# Patient Record
Sex: Female | Born: 1980 | Race: White | Hispanic: No | Marital: Married | Smoking: Never smoker
Health system: Northeastern US, Academic
[De-identification: ages and names within clinical notes are randomized; demographics above are authoritative.]

## PROBLEM LIST (undated history)

## (undated) ENCOUNTER — Encounter

## (undated) DIAGNOSIS — E282 Polycystic ovarian syndrome: Secondary | ICD-10-CM

## (undated) HISTORY — PX: HYSTEROSCOPY: SHX211

## (undated) HISTORY — PX: WISDOM TOOTH EXTRACTION: SHX21

## (undated) MED FILL — ZEPBOUND 2.5 MG/0.5 ML SUBCUTANEOUS PEN INJECTOR: 2.5 2.5 mg/0.5 mL | SUBCUTANEOUS | 28 days supply | Qty: 2 | Fill #0 | Status: CN

---

## 2011-11-07 ENCOUNTER — Other Ambulatory Visit (HOSPITAL_COMMUNITY): Payer: Self-pay | Admitting: Gynecology

## 2011-11-07 DIAGNOSIS — N979 Female infertility, unspecified: Secondary | ICD-10-CM

## 2011-11-11 ENCOUNTER — Ambulatory Visit (HOSPITAL_COMMUNITY)
Admission: RE | Admit: 2011-11-11 | Discharge: 2011-11-11 | Disposition: A | Discharge status: Home / Self Care | Payer: BC Managed Care – PPO | Source: Ambulatory Visit | Attending: Gynecology | Admitting: Gynecology

## 2011-11-11 DIAGNOSIS — N979 Female infertility, unspecified: Secondary | ICD-10-CM

## 2011-11-11 MED ORDER — IOHEXOL 300 MG/ML  SOLN: 10.0000 mL | Freq: Once | INTRAMUSCULAR | Status: AC | PRN

## 2012-01-07 ENCOUNTER — Encounter (HOSPITAL_COMMUNITY): Payer: Self-pay | Admitting: Pharmacist

## 2012-01-07 ENCOUNTER — Encounter (HOSPITAL_COMMUNITY): Payer: Self-pay | Admitting: *Deleted

## 2012-01-07 NOTE — H&P (Signed)
Alexa Leonard is an 31 y.o. female G1P0 with MAB. CRL C/W 8 5/7 weeks with no FHT on prolonged U/S exam.    Pertinent Gynecological History: Menses: N/A Bleeding: pregnany Contraception: none DES exposure: unknown Blood transfusions: none Sexually transmitted diseases: no past history Previous GYN Procedures: none  Last mammogram: N/A Date: N/A Last pap: normal Date: 2012 OB History: G1, P0   Menstrual History: Menarche age: unknown No LMP recorded.    Past Medical History  Diagnosis Date  . PCOS (polycystic ovarian syndrome)     Past Surgical History  Procedure Date  . Wisdom tooth extraction     History reviewed. No pertinent family history.  Social History:  reports that she has quit smoking. She does not have any smokeless tobacco history on file. She reports that she does not drink alcohol or use illicit drugs.  Allergies: Allergies not on file  No prescriptions prior to admission    Review of Systems  Constitutional: Negative for fever.    There were no vitals taken for this visit. Physical Exam  Cardiovascular: Normal rate and regular rhythm.   Respiratory: Effort normal and breath sounds normal.  GI: Soft. There is no tenderness.  A+  No results found for this or any previous visit (from the past 24 hour(s)).  No results found.  Assessment/Plan: 31 yo G1P0 with MAB, CRL 8 5/7 weeks A+ blood type For D&E Alexa Leonard II,Alexa Leonard E 01/07/2012, 1:18 PM

## 2012-01-08 ENCOUNTER — Encounter (HOSPITAL_COMMUNITY): Payer: Self-pay | Admitting: Anesthesiology

## 2012-01-08 ENCOUNTER — Ambulatory Visit (HOSPITAL_COMMUNITY)
Admission: RE | Admit: 2012-01-08 | Discharge: 2012-01-08 | Disposition: A | Discharge status: Home / Self Care | Payer: BC Managed Care – PPO | Source: Ambulatory Visit | Attending: Obstetrics and Gynecology | Admitting: Obstetrics and Gynecology

## 2012-01-08 ENCOUNTER — Ambulatory Visit (HOSPITAL_COMMUNITY): Payer: BC Managed Care – PPO | Admitting: Anesthesiology

## 2012-01-08 ENCOUNTER — Encounter (HOSPITAL_COMMUNITY): Payer: Self-pay | Admitting: *Deleted

## 2012-01-08 ENCOUNTER — Encounter (HOSPITAL_COMMUNITY)
Admission: RE | Disposition: A | Discharge status: Home / Self Care | Payer: Self-pay | Source: Ambulatory Visit | Attending: Obstetrics and Gynecology

## 2012-01-08 DIAGNOSIS — O021 Missed abortion: Secondary | ICD-10-CM | POA: Insufficient documentation

## 2012-01-08 HISTORY — DX: Polycystic ovarian syndrome: E28.2

## 2012-01-08 HISTORY — PX: DILATION AND EVACUATION: SHX1459

## 2012-01-08 LAB — CBC
HCT: 41 % (ref 36.0–46.0)
Hemoglobin: 13.9 g/dL (ref 12.0–15.0)
MCV: 89.3 fL (ref 78.0–100.0)
RBC: 4.59 MIL/uL (ref 3.87–5.11)
RDW: 12.9 % (ref 11.5–15.5)
WBC: 8 10*3/uL (ref 4.0–10.5)

## 2012-01-08 SURGERY — DILATION AND EVACUATION, UTERUS
Anesthesia: Monitor Anesthesia Care | Site: Vagina | Wound class: Clean Contaminated

## 2012-01-08 MED ORDER — LIDOCAINE HCL 1 % IJ SOLN
INTRAMUSCULAR | Status: DC | PRN
  Administered 2012-01-08: 20 mL

## 2012-01-08 MED ORDER — METOCLOPRAMIDE HCL 5 MG/ML IJ SOLN: 10.0000 mg | Freq: Once | INTRAMUSCULAR | Status: DC | PRN

## 2012-01-08 MED ORDER — ONDANSETRON HCL 4 MG/2ML IJ SOLN
INTRAMUSCULAR | Status: AC
  Filled 2012-01-08: qty 2

## 2012-01-08 MED ORDER — HYDROCODONE-ACETAMINOPHEN 5-325 MG PO TABS
1.0000 | ORAL_TABLET | Freq: Once | ORAL | Status: AC
  Administered 2012-01-08: 1 via ORAL

## 2012-01-08 MED ORDER — FENTANYL CITRATE 0.05 MG/ML IJ SOLN
INTRAMUSCULAR | Status: AC
  Filled 2012-01-08: qty 2

## 2012-01-08 MED ORDER — PROPOFOL 10 MG/ML IV EMUL
INTRAVENOUS | Status: DC | PRN
  Administered 2012-01-08: 80 mg via INTRAVENOUS
  Administered 2012-01-08 (×2): 40 mg via INTRAVENOUS

## 2012-01-08 MED ORDER — CEFAZOLIN SODIUM 1-5 GM-% IV SOLN
INTRAVENOUS | Status: AC
  Filled 2012-01-08: qty 50

## 2012-01-08 MED ORDER — LIDOCAINE HCL (CARDIAC) 20 MG/ML IV SOLN
INTRAVENOUS | Status: DC | PRN
  Administered 2012-01-08: 80 mg via INTRAVENOUS

## 2012-01-08 MED ORDER — KETOROLAC TROMETHAMINE 30 MG/ML IJ SOLN
INTRAMUSCULAR | Status: DC | PRN
  Administered 2012-01-08: 30 mg via INTRAVENOUS

## 2012-01-08 MED ORDER — FENTANYL CITRATE 0.05 MG/ML IJ SOLN
INTRAMUSCULAR | Status: DC | PRN
  Administered 2012-01-08: 100 ug via INTRAVENOUS

## 2012-01-08 MED ORDER — FENTANYL CITRATE 0.05 MG/ML IJ SOLN: 25.0000 ug | INTRAMUSCULAR | Status: DC | PRN

## 2012-01-08 MED ORDER — LIDOCAINE HCL (CARDIAC) 20 MG/ML IV SOLN
INTRAVENOUS | Status: AC
  Filled 2012-01-08: qty 5

## 2012-01-08 MED ORDER — PROPOFOL 10 MG/ML IV EMUL
INTRAVENOUS | Status: AC
  Filled 2012-01-08: qty 20

## 2012-01-08 MED ORDER — MEPERIDINE HCL 25 MG/ML IJ SOLN: 6.2500 mg | INTRAMUSCULAR | Status: DC | PRN

## 2012-01-08 MED ORDER — MIDAZOLAM HCL 2 MG/2ML IJ SOLN
INTRAMUSCULAR | Status: AC
  Filled 2012-01-08: qty 4

## 2012-01-08 MED ORDER — OXYTOCIN 10 UNIT/ML IJ SOLN
INTRAMUSCULAR | Status: DC | PRN
  Administered 2012-01-08: 20 [IU] via INTRAMUSCULAR

## 2012-01-08 MED ORDER — ONDANSETRON HCL 4 MG/2ML IJ SOLN
INTRAMUSCULAR | Status: DC | PRN
  Administered 2012-01-08: 4 mg via INTRAVENOUS

## 2012-01-08 MED ORDER — MIDAZOLAM HCL 5 MG/5ML IJ SOLN
INTRAMUSCULAR | Status: DC | PRN
  Administered 2012-01-08: 2 mg via INTRAVENOUS

## 2012-01-08 MED ORDER — METHYLERGONOVINE MALEATE 0.2 MG PO TABS: 0.2000 mg | ORAL_TABLET | Freq: Three times a day (TID) | ORAL | Status: AC

## 2012-01-08 MED ORDER — HYDROCODONE-ACETAMINOPHEN 5-500 MG PO TABS: 1.0000 | ORAL_TABLET | Freq: Four times a day (QID) | ORAL | Status: AC | PRN

## 2012-01-08 MED ORDER — CEFAZOLIN SODIUM 1-5 GM-% IV SOLN
1.0000 g | INTRAVENOUS | Status: AC
  Administered 2012-01-08: 1 g via INTRAVENOUS

## 2012-01-08 MED ORDER — HYDROCODONE-ACETAMINOPHEN 5-325 MG PO TABS
ORAL_TABLET | ORAL | Status: AC
  Filled 2012-01-08: qty 1

## 2012-01-08 MED ORDER — LACTATED RINGERS IV SOLN
INTRAVENOUS | Status: DC
  Administered 2012-01-08 (×2): via INTRAVENOUS

## 2012-01-08 SURGICAL SUPPLY — 17 items
CATH ROBINSON RED A/P 16FR (CATHETERS) ×2 IMPLANT
CLOTH BEACON ORANGE TIMEOUT ST (SAFETY) ×2 IMPLANT
DECANTER SPIKE VIAL GLASS SM (MISCELLANEOUS) ×2 IMPLANT
GLOVE BIO SURGEON STRL SZ8 (GLOVE) ×4 IMPLANT
GOWN PREVENTION PLUS LG XLONG (DISPOSABLE) ×2 IMPLANT
KIT BERKELEY 1ST TRIMESTER 3/8 (MISCELLANEOUS) ×2 IMPLANT
NEEDLE SPNL 22GX3.5 QUINCKE BK (NEEDLE) ×2 IMPLANT
NS IRRIG 1000ML POUR BTL (IV SOLUTION) ×2 IMPLANT
PACK VAGINAL MINOR WOMEN LF (CUSTOM PROCEDURE TRAY) ×2 IMPLANT
PAD PREP 24X48 CUFFED NSTRL (MISCELLANEOUS) ×2 IMPLANT
SET BERKELEY SUCTION TUBING (SUCTIONS) ×2 IMPLANT
SYR CONTROL 10ML LL (SYRINGE) ×2 IMPLANT
TOWEL OR 17X24 6PK STRL BLUE (TOWEL DISPOSABLE) ×4 IMPLANT
VACURETTE 10 RIGID CVD (CANNULA) IMPLANT
VACURETTE 7MM CVD STRL WRAP (CANNULA) IMPLANT
VACURETTE 8 RIGID CVD (CANNULA) ×2 IMPLANT
VACURETTE 9 RIGID CVD (CANNULA) ×2 IMPLANT

## 2012-01-08 NOTE — Discharge Instructions (Signed)
No vaginal entryDISCHARGE INSTRUCTIONS: D&C / D&E The following instructions have been prepared to help you care for yourself upon your return home.   Personal hygiene: . Use sanitary pads for vaginal drainage, not tampons. . Shower the day after your procedure. . NO tub baths, pools or Jacuzzis for 2-3 weeks. . Wipe front to back after using the bathroom.  Activity and limitations: . Do NOT drive or operate any equipment for 24 hours. The effects of anesthesia are still present and drowsiness may result. . Do NOT rest in bed all day. . Walking is encouraged. . Walk up and down stairs slowly. . You may resume your normal activity in one to two days or as indicated by your physician.  Sexual activity: NO intercourse for at least 2 weeks after the procedure, or as indicated by your physician.  Diet: Eat a light meal as desired this evening. You may resume your usual diet tomorrow.  Return to work: You may resume your work activities in one to two days or as indicated by your doctor.  What to expect after your surgery: Expect to have vaginal bleeding/discharge for 2-3 days and spotting for up to 10 days. It is not unusual to have soreness for up to 1-2 weeks. You may have a slight burning sensation when you urinate for the first day. Mild cramps may continue for a couple of days. You may have a regular period in 2-6 weeks.  Call your doctor for any of the following: . Excessive vaginal bleeding, saturating and changing one pad every hour. . Inability to urinate 6 hours after discharge from hospital. . Pain not relieved by pain medication. . Fever of 100.4 F or greater. . Unusual vaginal discharge or odor.  Return to office ________________ Call for an appointment ___________________  Patient's signature: ______________________  Nurse's signature ________________________  Post Anesthesia Care Unit [phone]   

## 2012-01-08 NOTE — Op Note (Signed)
NAMEJAZ, LANINGHAM NO.:  [id number]  MEDICAL RECORD NO.:  [id number]  LOCATION:  [facility]                          FACILITY:  [facility]  PHYSICIAN:  Guy Sandifer. Henderson Cloud, M.D. DATE OF BIRTH:  04-26-1981  DATE OF PROCEDURE:  01/08/2012 DATE OF DISCHARGE:  01/08/2012                              OPERATIVE REPORT   PREOPERATIVE DIAGNOSIS:  Missed abortion.  POSTOPERATIVE DIAGNOSIS:  Missed abortion.  PROCEDURE:  Dilatation and evacuation.  SURGEON:  Guy Sandifer. Henderson Cloud, MD  ANESTHESIA:  MAC.  SPECIMENS:  Products of conception to Pathology.  ESTIMATED BLOOD LOSS:  Less than 100 mL.  INDICATIONS AND CONSENT:  This patient is a 31 year old married white female, G1, P0 at 9-1/2 weeks by dates and 8 weeks 5 days by crown-rump length.  There is no fetal heart rate on prolonged ultrasound examination.  After discussion of options, she was brought to the operating room for dilatation and evacuation.  Potential risks and complications have been discussed preoperatively including but not limited to, infection, organ damage, uterine perforation, bleeding requiring transfusion of blood products with HIV and hepatitis acquisition, laparotomy, laparoscopy, DVT, PE, pneumonia, transfusion of blood products with HIV and hepatitis acquisition, intrauterine synechia, and secondary infertility.  All questions were answered and consent was signed on the chart.  PROCEDURE:  The patient was taken to the operating room, where she was identified, placed in dorsal supine position and she was sedated.  She was then placed in the dorsal lithotomy position.  Time-out undertaken. She was prepped, bladder straight catheterized and draped in sterile fashion.  The uterus was 9 weeks in size, mid plane.  Bivalve speculum was placed in the vagina and the anterior cervical lip was injected with 1% plain Xylocaine and grasped with single-tooth tenaculum. Paracervical block was placed at 2, 4, 5, 7, 8,  and 10 o'clock positions with approximately 20 mL of the same solution.  Cervix was gently progressively dilated.  A #8 curved curette was then placed and suction curettage was carried out for obvious products of conception.  Pitocin 20 units were added to the remaining 700 mL of IV fluids after the initial pass of the suction curette.  Alternating sharp and suction curettage was carried out and the cavity was clean.  Good hemostasis was obtained. All counts were correct.  The patient was awakened, taken to recovery room in stable condition.  The patient does receive preoperative antibiotics.  Blood type is Rh positive.     Guy Sandifer Henderson Cloud, M.D.     JET/MEDQ  D:  01/08/2012  T:  01/08/2012  Job:  [id number]

## 2012-01-08 NOTE — Anesthesia Preprocedure Evaluation (Signed)
Anesthesia Evaluation  Patient identified by MRN, date of birth, ID band Patient awake    Reviewed: Allergy & Precautions, H&P , NPO status , Patient's Chart, lab work & pertinent test results  Airway Mallampati: I TM Distance: >3 FB Neck ROM: full    Dental No notable dental hx.    Pulmonary neg pulmonary ROS,    Pulmonary exam normal       Cardiovascular negative cardio ROS      Neuro/Psych negative neurological ROS  negative psych ROS   GI/Hepatic negative GI ROS, Neg liver ROS,   Endo/Other  negative endocrine ROS  Renal/GU negative Renal ROS  negative genitourinary   Musculoskeletal   Abdominal Normal abdominal exam  (+)   Peds negative pediatric ROS (+)  Hematology negative hematology ROS (+)   Anesthesia Other Findings   Reproductive/Obstetrics                           Anesthesia Physical Anesthesia Plan  ASA: II  Anesthesia Plan: MAC   Post-op Pain Management:    Induction: Intravenous  Airway Management Planned:   Additional Equipment:   Intra-op Plan:   Post-operative Plan:   Informed Consent: I have reviewed the patients History and Physical, chart, labs and discussed the procedure including the risks, benefits and alternatives for the proposed anesthesia with the patient or authorized representative who has indicated his/her understanding and acceptance.     Plan Discussed with: CRNA and Surgeon  Anesthesia Plan Comments:         Anesthesia Quick Evaluation

## 2012-01-08 NOTE — Addendum Note (Signed)
Addendum  created 01/08/12 1459 by Tyrone Apple. Malen Gauze, MD   Modules edited:Orders, PRL Based Order Sets

## 2012-01-08 NOTE — Anesthesia Postprocedure Evaluation (Signed)
Anesthesia Post Note  Patient: Alexa Leonard  Procedure(s) Performed: Procedure(s) (LRB): DILATATION AND EVACUATION (N/A)  Anesthesia type: MAC  Patient location: PACU  Post pain: Pain level controlled  Post assessment: Post-op Vital signs reviewed  Last Vitals:  Filed Vitals:   01/08/12 1123  BP: 114/77  Pulse: 76  Temp: 36.7 C  Resp: 18    Post vital signs: Reviewed  Level of consciousness: sedated  Complications: No apparent anesthesia complications

## 2012-01-08 NOTE — Brief Op Note (Signed)
01/08/2012  1:47 PM  PATIENT:  Alexa Leonard  31 y.o. female  PRE-OPERATIVE DIAGNOSIS:  missed ab  POST-OPERATIVE DIAGNOSIS:  missed ab  PROCEDURE:  Procedure(s) (LRB): DILATATION AND EVACUATION (N/A)  SURGEON:  Surgeon(s) and Role:    * Leslie Andrea, MD - Primary  PHYSICIAN ASSISTANT:   ASSISTANTS: none   ANESTHESIA:   MAC  EBL:  Total I/O In: 1500 [I.V.:1500] Out: -   BLOOD ADMINISTERED:none  DRAINS: none   LOCAL MEDICATIONS USED:  LIDOCAINE   SPECIMEN:  Source of Specimen:  POC  DISPOSITION OF SPECIMEN:  PATHOLOGY  COUNTS:  YES  TOURNIQUET:  * No tourniquets in log *  DICTATION: .Other Dictation: Dictation Number [id number]  PLAN OF CARE: Discharge to home after PACU  PATIENT DISPOSITION:  PACU - hemodynamically stable.   Delay start of Pharmacological VTE agent (>24hrs) due to surgical blood loss or risk of bleeding: not applicable

## 2012-01-08 NOTE — Progress Notes (Signed)
Reviewed with patient/husband D&E. Reviewed complications-infection, bleeding-HIV/Hep, organ damage, DVT/PE, IU synechiae and infertility. Methergine 0.2 tid x 6 doses All questions answered

## 2012-01-08 NOTE — Transfer of Care (Signed)
Immediate Anesthesia Transfer of Care Note  Patient: Alexa Leonard  Procedure(s) Performed: Procedure(s) (LRB): DILATATION AND EVACUATION (N/A)  Patient Location: PACU  Anesthesia Type: MAC  Level of Consciousness: awake, alert  and oriented  Airway & Oxygen Therapy: Patient Spontanous Breathing  Post-op Assessment: Report given to PACU RN and Post -op Vital signs reviewed and stable  Post vital signs: Reviewed and stable  Complications: No apparent anesthesia complications

## 2012-01-12 ENCOUNTER — Encounter (HOSPITAL_COMMUNITY): Payer: Self-pay | Admitting: Obstetrics and Gynecology

## 2012-06-03 ENCOUNTER — Other Ambulatory Visit (HOSPITAL_COMMUNITY): Payer: Self-pay | Admitting: Gynecology

## 2012-06-03 DIAGNOSIS — Z3141 Encounter for fertility testing: Secondary | ICD-10-CM

## 2012-06-08 ENCOUNTER — Ambulatory Visit (HOSPITAL_COMMUNITY)
Admission: RE | Admit: 2012-06-08 | Discharge: 2012-06-08 | Disposition: A | Discharge status: Home / Self Care | Payer: BC Managed Care – PPO | Source: Ambulatory Visit | Attending: Gynecology | Admitting: Gynecology

## 2012-06-08 DIAGNOSIS — Z3141 Encounter for fertility testing: Secondary | ICD-10-CM

## 2012-06-08 DIAGNOSIS — N979 Female infertility, unspecified: Secondary | ICD-10-CM | POA: Insufficient documentation

## 2012-06-08 MED ORDER — IOHEXOL 300 MG/ML  SOLN: 26.0000 mL | Freq: Once | INTRAMUSCULAR | Status: AC | PRN

## 2012-12-25 LAB — OB RESULTS CONSOLE HEPATITIS B SURFACE ANTIGEN: Hepatitis B Surface Ag: NEGATIVE

## 2012-12-25 LAB — OB RESULTS CONSOLE ABO/RH: RH Type: POSITIVE

## 2012-12-25 LAB — OB RESULTS CONSOLE ANTIBODY SCREEN: Antibody Screen: NEGATIVE

## 2012-12-25 LAB — OB RESULTS CONSOLE RUBELLA ANTIBODY, IGM: Rubella: IMMUNE

## 2012-12-25 LAB — OB RESULTS CONSOLE GC/CHLAMYDIA: Gonorrhea: NEGATIVE

## 2013-03-29 ENCOUNTER — Encounter (HOSPITAL_COMMUNITY): Payer: Self-pay | Admitting: Obstetrics and Gynecology

## 2013-04-01 ENCOUNTER — Other Ambulatory Visit (HOSPITAL_COMMUNITY): Payer: Self-pay | Admitting: Obstetrics and Gynecology

## 2013-04-01 DIAGNOSIS — R772 Abnormality of alphafetoprotein: Secondary | ICD-10-CM

## 2013-04-05 ENCOUNTER — Ambulatory Visit (HOSPITAL_COMMUNITY)
Admission: RE | Admit: 2013-04-05 | Discharge: 2013-04-05 | Disposition: A | Discharge status: Home / Self Care | Payer: BC Managed Care – PPO | Source: Ambulatory Visit | Attending: Obstetrics and Gynecology | Admitting: Obstetrics and Gynecology

## 2013-04-05 ENCOUNTER — Encounter (HOSPITAL_COMMUNITY): Payer: Self-pay

## 2013-04-05 VITALS — BP 123/78 | HR 110 | Wt 150.5 lb

## 2013-04-05 DIAGNOSIS — O358XX0 Maternal care for other (suspected) fetal abnormality and damage, not applicable or unspecified: Secondary | ICD-10-CM | POA: Insufficient documentation

## 2013-04-05 DIAGNOSIS — R772 Abnormality of alphafetoprotein: Secondary | ICD-10-CM

## 2013-04-05 DIAGNOSIS — O3500X0 Maternal care for (suspected) central nervous system malformation or damage in fetus, unspecified, not applicable or unspecified: Secondary | ICD-10-CM | POA: Insufficient documentation

## 2013-04-05 DIAGNOSIS — Z1389 Encounter for screening for other disorder: Secondary | ICD-10-CM | POA: Insufficient documentation

## 2013-04-05 DIAGNOSIS — Z363 Encounter for antenatal screening for malformations: Secondary | ICD-10-CM | POA: Insufficient documentation

## 2013-04-05 DIAGNOSIS — O350XX0 Maternal care for (suspected) central nervous system malformation in fetus, not applicable or unspecified: Secondary | ICD-10-CM | POA: Insufficient documentation

## 2013-04-05 NOTE — Progress Notes (Signed)
Alexa Leonard  was seen today for an ultrasound appointment.  See full report in [facility].  Comments: Ms. Cowdrey was seen today due to elevated MSAFP (2.71 MoM).  The fetal anatomic survey was within normal limits; specifically, the spine, cerebellum, posterior fossa and cord insertion were within normal limits.  We briefly reviewed the differential diagnosis of elevated MSAFP in pregnancy.  Despite normal ultrasound findings, she is at increased risk for fetal growth restriction and hypertensive disorders of pregnancy and continued fetal growth surveillance is indicated.  Impression: Single IUP at 19 1/7 weeks Normal anatmoic fetal survey.  Somewhat limited views of the fetal heart were obtained. No markers associated with aneuploidy were appreciated. Normal amniotic fluid volume.  Recommendations: Recommend follow-up ultrasound examination in 6 weeks for interval growth and to reevaluate the fetal heart.  Alpha Gula, MD

## 2013-04-13 ENCOUNTER — Other Ambulatory Visit: Payer: Self-pay

## 2013-05-17 ENCOUNTER — Encounter (HOSPITAL_COMMUNITY): Payer: Self-pay

## 2013-05-17 ENCOUNTER — Ambulatory Visit (HOSPITAL_COMMUNITY)
Admission: RE | Admit: 2013-05-17 | Discharge: 2013-05-17 | Disposition: A | Discharge status: Home / Self Care | Payer: BC Managed Care – PPO | Source: Ambulatory Visit | Attending: Obstetrics and Gynecology | Admitting: Obstetrics and Gynecology

## 2013-05-17 VITALS — BP 124/78 | HR 115 | Wt 160.0 lb

## 2013-05-17 DIAGNOSIS — O3500X0 Maternal care for (suspected) central nervous system malformation or damage in fetus, unspecified, not applicable or unspecified: Secondary | ICD-10-CM | POA: Insufficient documentation

## 2013-05-17 DIAGNOSIS — O350XX0 Maternal care for (suspected) central nervous system malformation in fetus, not applicable or unspecified: Secondary | ICD-10-CM | POA: Insufficient documentation

## 2013-05-17 DIAGNOSIS — R772 Abnormality of alphafetoprotein: Secondary | ICD-10-CM

## 2013-05-17 DIAGNOSIS — Z3689 Encounter for other specified antenatal screening: Secondary | ICD-10-CM | POA: Insufficient documentation

## 2013-05-17 NOTE — Progress Notes (Signed)
Alexa Leonard  was seen today for an ultrasound appointment.  See full report in [facility].  Impression: Single IUP at 25 5/7 weeks Elevated MSAFP (2.71 MoM) There appears to be an isolated, dilated loop of bowel measuring 1.2 cm in diameter in the right pelvis the remainder of the fetal anatomy appears normal Interval growth is appropriate (45th %tile) Normal amniotic fluid volume  Recommendations: Recommend follow-up ultrasound examination ibn 4 weeks for interval growth and to reevaluate anatomy.  Alexa Gula, MD

## 2013-06-14 ENCOUNTER — Ambulatory Visit (HOSPITAL_COMMUNITY)
Admission: RE | Admit: 2013-06-14 | Discharge: 2013-06-14 | Disposition: A | Discharge status: Home / Self Care | Payer: BC Managed Care – PPO | Source: Ambulatory Visit | Attending: Obstetrics and Gynecology | Admitting: Obstetrics and Gynecology

## 2013-06-14 DIAGNOSIS — O350XX0 Maternal care for (suspected) central nervous system malformation in fetus, not applicable or unspecified: Secondary | ICD-10-CM | POA: Insufficient documentation

## 2013-06-14 DIAGNOSIS — R772 Abnormality of alphafetoprotein: Secondary | ICD-10-CM

## 2013-06-14 DIAGNOSIS — O3500X0 Maternal care for (suspected) central nervous system malformation or damage in fetus, unspecified, not applicable or unspecified: Secondary | ICD-10-CM | POA: Insufficient documentation

## 2013-07-11 ENCOUNTER — Other Ambulatory Visit (HOSPITAL_COMMUNITY): Payer: Self-pay | Admitting: Obstetrics and Gynecology

## 2013-07-11 DIAGNOSIS — O289 Unspecified abnormal findings on antenatal screening of mother: Secondary | ICD-10-CM

## 2013-07-12 ENCOUNTER — Ambulatory Visit (HOSPITAL_COMMUNITY)
Admission: RE | Admit: 2013-07-12 | Discharge: 2013-07-12 | Disposition: A | Discharge status: Home / Self Care | Payer: BC Managed Care – PPO | Source: Ambulatory Visit | Attending: Obstetrics and Gynecology | Admitting: Obstetrics and Gynecology

## 2013-07-12 ENCOUNTER — Encounter (HOSPITAL_COMMUNITY): Payer: Self-pay

## 2013-07-12 DIAGNOSIS — Z3689 Encounter for other specified antenatal screening: Secondary | ICD-10-CM | POA: Insufficient documentation

## 2013-07-12 DIAGNOSIS — O3500X0 Maternal care for (suspected) central nervous system malformation or damage in fetus, unspecified, not applicable or unspecified: Secondary | ICD-10-CM | POA: Insufficient documentation

## 2013-07-12 DIAGNOSIS — O289 Unspecified abnormal findings on antenatal screening of mother: Secondary | ICD-10-CM

## 2013-07-12 DIAGNOSIS — O350XX0 Maternal care for (suspected) central nervous system malformation in fetus, not applicable or unspecified: Secondary | ICD-10-CM | POA: Insufficient documentation

## 2013-07-21 LAB — OB RESULTS CONSOLE GBS: GBS: NEGATIVE

## 2013-08-23 ENCOUNTER — Encounter (HOSPITAL_COMMUNITY): Payer: Self-pay | Admitting: *Deleted

## 2013-08-23 ENCOUNTER — Telehealth (HOSPITAL_COMMUNITY): Payer: Self-pay | Admitting: *Deleted

## 2013-08-23 NOTE — Telephone Encounter (Signed)
Preadmission screen  

## 2013-08-26 ENCOUNTER — Inpatient Hospital Stay (HOSPITAL_COMMUNITY)
Admission: AD | Admit: 2013-08-26 | Discharge: 2013-08-26 | Disposition: A | Discharge status: Home / Self Care | Payer: BC Managed Care – PPO | Source: Ambulatory Visit | Attending: Obstetrics and Gynecology | Admitting: Obstetrics and Gynecology

## 2013-08-26 ENCOUNTER — Encounter (HOSPITAL_COMMUNITY): Payer: Self-pay | Admitting: *Deleted

## 2013-08-26 DIAGNOSIS — O99891 Other specified diseases and conditions complicating pregnancy: Secondary | ICD-10-CM | POA: Insufficient documentation

## 2013-08-26 NOTE — MAU Note (Signed)
Pt presents with complaints of a gush of fluid last night and noticed some more leakage this morning. Denies any vaginal bleeding, states baby is active.

## 2013-08-28 ENCOUNTER — Inpatient Hospital Stay (HOSPITAL_COMMUNITY)
Admission: RE | Admit: 2013-08-28 | Discharge: 2013-08-31 | DRG: 775 | Disposition: A | Discharge status: Home / Self Care | Payer: BC Managed Care – PPO | Source: Ambulatory Visit | Attending: Obstetrics and Gynecology | Admitting: Obstetrics and Gynecology

## 2013-08-28 DIAGNOSIS — Z87891 Personal history of nicotine dependence: Secondary | ICD-10-CM

## 2013-08-28 DIAGNOSIS — IMO0001 Reserved for inherently not codable concepts without codable children: Secondary | ICD-10-CM

## 2013-08-28 LAB — CBC
MCH: 30.4 pg (ref 26.0–34.0)
MCHC: 34.2 g/dL (ref 30.0–36.0)
MCV: 88.8 fL (ref 78.0–100.0)
Platelets: 205 10*3/uL (ref 150–400)
RBC: 4.54 MIL/uL (ref 3.87–5.11)
RDW: 14.2 % (ref 11.5–15.5)

## 2013-08-28 MED ORDER — BUTORPHANOL TARTRATE 1 MG/ML IJ SOLN
1.0000 mg | INTRAMUSCULAR | Status: DC | PRN
Start: 2013-08-28 — End: 2013-08-29
  Administered 2013-08-29 (×2): 1 mg via INTRAVENOUS
  Filled 2013-08-28 (×2): qty 1

## 2013-08-28 MED ORDER — LIDOCAINE HCL (PF) 1 % IJ SOLN
30.0000 mL | INTRAMUSCULAR | Status: DC | PRN
  Filled 2013-08-28: qty 30

## 2013-08-28 MED ORDER — LACTATED RINGERS IV SOLN: 500.0000 mL | INTRAVENOUS | Status: DC | PRN

## 2013-08-28 MED ORDER — OXYTOCIN BOLUS FROM INFUSION: 500.0000 mL | INTRAVENOUS | Status: DC

## 2013-08-28 MED ORDER — OXYTOCIN 40 UNITS IN LACTATED RINGERS INFUSION - SIMPLE MED
62.5000 mL/h | INTRAVENOUS | Status: DC
  Filled 2013-08-28: qty 1000

## 2013-08-28 MED ORDER — MISOPROSTOL 25 MCG QUARTER TABLET
25.0000 ug | ORAL_TABLET | ORAL | Status: AC | PRN
  Administered 2013-08-28 – 2013-08-29 (×2): 25 ug via VAGINAL
  Filled 2013-08-28 (×2): qty 0.25

## 2013-08-28 MED ORDER — ZOLPIDEM TARTRATE 5 MG PO TABS: 5.0000 mg | ORAL_TABLET | Freq: Every evening | ORAL | Status: DC | PRN

## 2013-08-28 MED ORDER — LACTATED RINGERS IV SOLN
INTRAVENOUS | Status: DC
  Administered 2013-08-28: 1000 mL via INTRAVENOUS
  Administered 2013-08-29: 03:00:00 via INTRAVENOUS

## 2013-08-28 MED ORDER — OXYCODONE-ACETAMINOPHEN 5-325 MG PO TABS: 1.0000 | ORAL_TABLET | ORAL | Status: DC | PRN

## 2013-08-28 MED ORDER — IBUPROFEN 600 MG PO TABS: 600.0000 mg | ORAL_TABLET | Freq: Four times a day (QID) | ORAL | Status: DC | PRN

## 2013-08-28 MED ORDER — ONDANSETRON HCL 4 MG/2ML IJ SOLN: 4.0000 mg | Freq: Four times a day (QID) | INTRAMUSCULAR | Status: DC | PRN

## 2013-08-28 MED ORDER — CITRIC ACID-SODIUM CITRATE 334-500 MG/5ML PO SOLN: 30.0000 mL | ORAL | Status: DC | PRN

## 2013-08-28 MED ORDER — PROMETHAZINE HCL 25 MG/ML IJ SOLN: 12.5000 mg | Freq: Four times a day (QID) | INTRAMUSCULAR | Status: DC | PRN

## 2013-08-28 MED ORDER — TERBUTALINE SULFATE 1 MG/ML IJ SOLN: 0.2500 mg | Freq: Once | INTRAMUSCULAR | Status: AC | PRN

## 2013-08-28 MED ORDER — ACETAMINOPHEN 325 MG PO TABS: 650.0000 mg | ORAL_TABLET | ORAL | Status: DC | PRN

## 2013-08-29 ENCOUNTER — Encounter (HOSPITAL_COMMUNITY): Payer: Self-pay

## 2013-08-29 ENCOUNTER — Inpatient Hospital Stay (HOSPITAL_COMMUNITY): Payer: BC Managed Care – PPO | Admitting: Anesthesiology

## 2013-08-29 ENCOUNTER — Encounter (HOSPITAL_COMMUNITY): Payer: BC Managed Care – PPO | Admitting: Anesthesiology

## 2013-08-29 LAB — RPR: RPR Ser Ql: NONREACTIVE

## 2013-08-29 MED ORDER — SENNOSIDES-DOCUSATE SODIUM 8.6-50 MG PO TABS
2.0000 | ORAL_TABLET | ORAL | Status: DC
  Administered 2013-08-29 – 2013-08-30 (×2): 2 via ORAL
  Filled 2013-08-29 (×2): qty 2

## 2013-08-29 MED ORDER — TETANUS-DIPHTH-ACELL PERTUSSIS 5-2.5-18.5 LF-MCG/0.5 IM SUSP: 0.5000 mL | Freq: Once | INTRAMUSCULAR | Status: DC

## 2013-08-29 MED ORDER — FLEET ENEMA 7-19 GM/118ML RE ENEM: 1.0000 | ENEMA | Freq: Every day | RECTAL | Status: DC | PRN

## 2013-08-29 MED ORDER — ZOLPIDEM TARTRATE 5 MG PO TABS: 5.0000 mg | ORAL_TABLET | Freq: Every evening | ORAL | Status: DC | PRN

## 2013-08-29 MED ORDER — OXYCODONE-ACETAMINOPHEN 5-325 MG PO TABS: 1.0000 | ORAL_TABLET | ORAL | Status: DC | PRN

## 2013-08-29 MED ORDER — LIDOCAINE HCL (PF) 1 % IJ SOLN
INTRAMUSCULAR | Status: DC | PRN
  Administered 2013-08-29: 12.5 mL

## 2013-08-29 MED ORDER — BISACODYL 10 MG RE SUPP: 10.0000 mg | Freq: Every day | RECTAL | Status: DC | PRN

## 2013-08-29 MED ORDER — FENTANYL 2.5 MCG/ML BUPIVACAINE 1/10 % EPIDURAL INFUSION (WH - ANES)
INTRAMUSCULAR | Status: DC | PRN
  Administered 2013-08-29: 12.5 mL/h via EPIDURAL

## 2013-08-29 MED ORDER — LANOLIN HYDROUS EX OINT: TOPICAL_OINTMENT | CUTANEOUS | Status: DC | PRN

## 2013-08-29 MED ORDER — FENTANYL 2.5 MCG/ML BUPIVACAINE 1/10 % EPIDURAL INFUSION (WH - ANES)
14.0000 mL/h | INTRAMUSCULAR | Status: DC | PRN
  Filled 2013-08-29: qty 125

## 2013-08-29 MED ORDER — EPHEDRINE 5 MG/ML INJ: 10.0000 mg | INTRAVENOUS | Status: DC | PRN

## 2013-08-29 MED ORDER — ONDANSETRON HCL 4 MG/2ML IJ SOLN: 4.0000 mg | INTRAMUSCULAR | Status: DC | PRN

## 2013-08-29 MED ORDER — PRENATAL MULTIVITAMIN CH
1.0000 | ORAL_TABLET | Freq: Every day | ORAL | Status: DC
  Administered 2013-08-30 – 2013-08-31 (×2): 1 via ORAL
  Filled 2013-08-29 (×2): qty 1

## 2013-08-29 MED ORDER — IBUPROFEN 600 MG PO TABS
600.0000 mg | ORAL_TABLET | Freq: Four times a day (QID) | ORAL | Status: DC
  Administered 2013-08-29 – 2013-08-31 (×6): 600 mg via ORAL
  Filled 2013-08-29 (×7): qty 1

## 2013-08-29 MED ORDER — PHENYLEPHRINE 40 MCG/ML (10ML) SYRINGE FOR IV PUSH (FOR BLOOD PRESSURE SUPPORT)
80.0000 ug | PREFILLED_SYRINGE | INTRAVENOUS | Status: DC | PRN
  Filled 2013-08-29: qty 10

## 2013-08-29 MED ORDER — EPHEDRINE 5 MG/ML INJ
10.0000 mg | INTRAVENOUS | Status: DC | PRN
  Filled 2013-08-29: qty 4

## 2013-08-29 MED ORDER — PHENYLEPHRINE 40 MCG/ML (10ML) SYRINGE FOR IV PUSH (FOR BLOOD PRESSURE SUPPORT): 80.0000 ug | PREFILLED_SYRINGE | INTRAVENOUS | Status: DC | PRN

## 2013-08-29 MED ORDER — LACTATED RINGERS IV SOLN: 500.0000 mL | Freq: Once | INTRAVENOUS | Status: DC

## 2013-08-29 MED ORDER — DIBUCAINE 1 % RE OINT: 1.0000 "application " | TOPICAL_OINTMENT | RECTAL | Status: DC | PRN

## 2013-08-29 MED ORDER — DIPHENHYDRAMINE HCL 50 MG/ML IJ SOLN: 12.5000 mg | INTRAMUSCULAR | Status: DC | PRN

## 2013-08-29 MED ORDER — ONDANSETRON HCL 4 MG PO TABS: 4.0000 mg | ORAL_TABLET | ORAL | Status: DC | PRN

## 2013-08-29 MED ORDER — BENZOCAINE-MENTHOL 20-0.5 % EX AERO
1.0000 "application " | INHALATION_SPRAY | CUTANEOUS | Status: DC | PRN
  Administered 2013-08-29: 1 via TOPICAL
  Filled 2013-08-29: qty 56

## 2013-08-29 MED ORDER — SIMETHICONE 80 MG PO CHEW: 80.0000 mg | CHEWABLE_TABLET | ORAL | Status: DC | PRN

## 2013-08-29 MED ORDER — WITCH HAZEL-GLYCERIN EX PADS: 1.0000 "application " | MEDICATED_PAD | CUTANEOUS | Status: DC | PRN

## 2013-08-29 MED ORDER — DIPHENHYDRAMINE HCL 25 MG PO CAPS: 25.0000 mg | ORAL_CAPSULE | Freq: Four times a day (QID) | ORAL | Status: DC | PRN

## 2013-08-29 NOTE — Progress Notes (Signed)
Cx rim on right/C/+1 AROM scant clear fluid FHT reactive, good response to scalp stim UCs q 2-3 min

## 2013-08-29 NOTE — H&P (Signed)
Alexa Leonard is a 32 y.o. female presenting for IOL last pm. Pregnancy complicated by elevated MSAFP with normal fetal surveillance.  Maternal Medical History:  Fetal activity: Perceived fetal activity is normal.      OB History   Grav Para Term Preterm Abortions TAB SAB Ect Mult Living   2 0 0 0 1 0 1 0 0 0      Past Medical History  Diagnosis Date  . PCOS (polycystic ovarian syndrome)    Past Surgical History  Procedure Laterality Date  . Wisdom tooth extraction    . Dilation and evacuation  01/08/2012    Procedure: DILATATION AND EVACUATION;  Surgeon: Leslie Andrea, MD;  Location: [facility];  Service: Gynecology;  Laterality: N/A;  . Hysteroscopy     Family History: family history includes Atrial fibrillation in her maternal grandmother and mother; Hypertension in her mother; Pulmonary embolism in her maternal grandfather. Social History:  reports that she quit smoking about 3 years ago. She has never used smokeless tobacco. She reports that she does not drink alcohol or use illicit drugs.   Prenatal Transfer Tool  Maternal Diabetes: No Genetic Screening: Normal Maternal Ultrasounds/Referrals: Normal Fetal Ultrasounds or other Referrals:  Referred to Materal Fetal Medicine  Maternal Substance Abuse:  No Significant Maternal Medications:  None Significant Maternal Lab Results:  Lab values include: Other: elevated MSAFP Other Comments:  None  Review of Systems  Eyes: Negative for blurred vision.  Gastrointestinal: Negative for abdominal pain.  Neurological: Negative for headaches.    Dilation: 2 Effacement (%): 70 Station: -2 Exam by:: AHarper, RN Blood pressure 125/81, pulse 101, temperature 98.3 F (36.8 C), temperature source Oral, resp. rate 18, height 5\' 2"  (1.575 m), weight 176 lb (79.833 kg), last menstrual period 11/18/2012, SpO2 99.00%. Maternal Exam:  Uterine Assessment: Contraction strength is firm.  Contraction frequency is regular.   Abdomen: Fetal  presentation: vertex     Fetal Exam Fetal State Assessment: Category I - tracings are normal.     Physical Exam  Cardiovascular: Normal rate and regular rhythm.   Respiratory: Effort normal and breath sounds normal.  GI: Soft. There is no tenderness.    Prenatal labs: ABO, Rh: A/Positive/-- (04/12 0000) Antibody: Negative (04/12 0000) Rubella: Immune (04/12 0000) RPR: NON REACTIVE (12/14 1950)  HBsAg: Negative (04/12 0000)  HIV: Non-reactive (04/12 0000)  GBS: Negative (11/06 0000)   Assessment/Plan: 32 yo G2P0 at 40 4/7 wks admitted for two stage induction D/W patient induction and reviewed options of pain management    Alfonza Toft II,Mazal Ebey E 08/29/2013, 8:07 AM

## 2013-08-29 NOTE — Progress Notes (Signed)
Delivery Note Pushing with good progress SVD VMI, LOA Placenta 3 vessels, intact-true knot in cord to path apgars 4/9 Delee suction of mucous with vigorous response  Arterial cord pH = 7.17 EBL 300cc Second degree ML lac repaired Rapide Patient / infant stable in LDR

## 2013-08-29 NOTE — Anesthesia Preprocedure Evaluation (Signed)
Anesthesia Evaluation  Patient identified by MRN, date of birth, ID band Patient awake    Reviewed: Allergy & Precautions, H&P   Airway Mallampati: II TM Distance: >3 FB Neck ROM: Full    Dental no notable dental hx. (+) Teeth Intact   Pulmonary former smoker,  breath sounds clear to auscultation  Pulmonary exam normal       Cardiovascular negative cardio ROS  Rhythm:Regular Rate:Normal     Neuro/Psych negative neurological ROS  negative psych ROS   GI/Hepatic negative GI ROS, Neg liver ROS,   Endo/Other  Obesity PCOS  Renal/GU negative Renal ROS  negative genitourinary   Musculoskeletal negative musculoskeletal ROS (+)   Abdominal (+) + obese,   Peds  Hematology negative hematology ROS (+)   Anesthesia Other Findings   Reproductive/Obstetrics (+) Pregnancy                           Anesthesia Physical Anesthesia Plan  ASA: II  Anesthesia Plan: Epidural   Post-op Pain Management:    Induction:   Airway Management Planned: Natural Airway  Additional Equipment:   Intra-op Plan:   Post-operative Plan:   Informed Consent: I have reviewed the patients History and Physical, chart, labs and discussed the procedure including the risks, benefits and alternatives for the proposed anesthesia with the patient or authorized representative who has indicated his/her understanding and acceptance.     Plan Discussed with: Anesthesiologist  Anesthesia Plan Comments:         Anesthesia Quick Evaluation

## 2013-08-29 NOTE — Anesthesia Procedure Notes (Signed)
Epidural Patient location during procedure: OB Start time: 08/29/2013 9:39 AM  Staffing Anesthesiologist: Williams Dietrick A. Performed by: anesthesiologist   Preanesthetic Checklist Completed: patient identified, site marked, surgical consent, pre-op evaluation, timeout performed, IV checked, risks and benefits discussed and monitors and equipment checked  Epidural Patient position: sitting Prep: site prepped and draped and DuraPrep Patient monitoring: continuous pulse ox and blood pressure Approach: midline Injection technique: LOR air  Needle:  Needle type: Tuohy  Needle gauge: 17 G Needle length: 9 cm and 9 Needle insertion depth: 5 cm cm Catheter type: closed end flexible Catheter size: 19 Gauge Catheter at skin depth: 10 cm Test dose: negative and Other  Assessment Events: blood not aspirated, injection not painful, no injection resistance, negative IV test and no paresthesia  Additional Notes Patient identified. Risks and benefits discussed including failed block, incomplete  Pain control, post dural puncture headache, nerve damage, paralysis, blood pressure Changes, nausea, vomiting, reactions to medications-both toxic and allergic and post Partum back pain. All questions were answered. Patient expressed understanding and wished to proceed. Sterile technique was used throughout procedure. Epidural site was Dressed with sterile barrier dressing. No paresthesias, signs of intravascular injection Or signs of intrathecal spread were encountered.  Patient was more comfortable after the epidural was dosed. Please see RN's note for documentation of vital signs and FHR which are stable.

## 2013-08-30 LAB — CBC
Hemoglobin: 11.5 g/dL — ABNORMAL LOW (ref 12.0–15.0)
MCHC: 34.5 g/dL (ref 30.0–36.0)
Platelets: 159 10*3/uL (ref 150–400)
RBC: 3.74 MIL/uL — ABNORMAL LOW (ref 3.87–5.11)

## 2013-08-30 NOTE — Anesthesia Postprocedure Evaluation (Signed)
Anesthesia Post Note  Patient: Alexa Leonard  Procedure(s) Performed: * No procedures listed *  Anesthesia type: Epidural  Patient location: Mother/Baby  Post pain: Pain level controlled  Post assessment: Post-op Vital signs reviewed  Last Vitals:  Filed Vitals:   08/30/13 0500  BP: 108/71  Pulse: 92  Temp: 36.6 C  Resp: 18    Post vital signs: Reviewed  Level of consciousness:alert  Complications: No apparent anesthesia complications

## 2013-08-30 NOTE — Progress Notes (Signed)
Post Partum Day 1 Subjective: no complaints, up ad lib, voiding and tolerating PO  Objective: Blood pressure 108/71, pulse 92, temperature 97.8 F (36.6 C), temperature source Oral, resp. rate 18, height 5\' 2"  (1.575 m), weight 176 lb (79.833 kg), last menstrual period 11/18/2012, SpO2 99.00%, unknown if currently breastfeeding.  Physical Exam:  General: alert and cooperative Lochia: appropriate Uterine Fundus: firm Incision: perineum intact DVT Evaluation: No evidence of DVT seen on physical exam. Negative Homan's sign. No cords or calf tenderness. No significant calf/ankle edema.   Recent Labs  08/28/13 1950 08/30/13 0600  HGB 13.8 11.5*  HCT 40.3 33.3*    Assessment/Plan: Plan for discharge tomorrow and Circumcision prior to discharge   LOS: 2 days   Jerlean Peralta G 08/30/2013, 7:57 AM

## 2013-08-31 ENCOUNTER — Ambulatory Visit: Payer: Self-pay

## 2013-08-31 MED ORDER — SERTRALINE HCL 50 MG PO TABS
50.0000 mg | ORAL_TABLET | Freq: Every day | ORAL | Status: DC
  Filled 2013-08-31 (×2): qty 1

## 2013-08-31 MED ORDER — SERTRALINE HCL 50 MG PO TABS: 50.0000 mg | ORAL_TABLET | Freq: Every day | ORAL | Status: AC

## 2013-08-31 NOTE — Discharge Summary (Signed)
Obstetric Discharge Summary Reason for Admission: induction of labor Prenatal Procedures: ultrasound Intrapartum Procedures: spontaneous vaginal delivery Postpartum Procedures: none Complications-Operative and Postpartum: 2 degree perineal laceration Hemoglobin  Date Value Range Status  08/30/2013 11.5* 12.0 - 15.0 g/dL Final     HCT  Date Value Range Status  08/30/2013 33.3* 36.0 - 46.0 % Final    Physical Exam:  General: alert and cooperative, somewhat tearful this am ,related to breastfeeding Lochia: appropriate Uterine Fundus: firm Incision: perineum intact DVT Evaluation: No evidence of DVT seen on physical exam. Negative Homan's sign. No cords or calf tenderness. No significant calf/ankle edema.  Discharge Diagnoses: Term Pregnancy-delivered  Discharge Information: Date: 08/31/2013 Activity: pelvic rest Diet: routine Medications: PNV, Ibuprofen and zoloft Condition: stable Instructions: refer to practice specific booklet Discharge to: home   Newborn Data: Live born female  Birth Weight: 7 lb 1.9 oz (3230 g) APGAR: 4, 9  Home with mother.  Alexa Leonard G 08/31/2013, 7:21 AM

## 2013-08-31 NOTE — Lactation Note (Signed)
This note was copied from the chart of Alexa Anzlee Hinesley. Lactation Consultation Note  Follow up visit at 48 hours of age.  Mom request [place] to see latch prior to discharge, but states baby was discharged this morning.  Mom unable to wake baby for feedings since 12:30 greater than 5 hours until now and baby remains sleepy.  Baby is latched to right breast with nipple shield, but chewing and disorganized with suck.  Attempted to fix latch, unsuccessful.  Baby sucks well with gloved finger.  Dad changed diaper to allow mom and I to work on hand expression.  Hand expressed of colostrum and assisted dad in spoon feeding baby. Baby back to breast with better rhythm few swallows with breast massage, no colostrum visible in nipple shield.  Discussed discharge with MBU RN and offered to get discharge cancelled or to offer formula supplementation.  Baby also has not voided since last night almost 24 hours ago.  Agreed to offer formula supplement with slow flow nipple.   Baby fell asleep at the breast after about 15 minutes of breast feeding.  Awakened baby to bottle feed and baby tolerated 15 mls well.  Encourage mom to post pump with DEBP at home through the night to stimulate her milk supply.  Feeding guide line of 18-24 mls given for current age.  Mom will supplement formula in addition to colostrum and breast feed first on demand.  Encouraged 10 good breast feedings in the next 24 hours.  Mom to follow up with peds in the morning if no void over night or other problems arise.  Parents feel better about discharge plan.  Mom has appt. For follow up out pt. To continue support of nipple shield consultation. Baby has appt. On Friday for weight check. Discussed plan with Imagene Sheller RN.   Patient Name: Alexa Leonard [id number] Date: 08/31/2013 Reason for consult: Follow-up assessment;Difficult latch   Maternal Data    Feeding Feeding Type: Bottle Fed - Formula Length of feed: 15 min  LATCH  Score/Interventions Latch: Grasps breast easily, tongue down, lips flanged, rhythmical sucking. Intervention(s): Adjust position;Assist with latch;Breast massage;Breast compression  Audible Swallowing: A few with stimulation Intervention(s): Hand expression;Skin to skin Intervention(s): Alternate breast massage  Type of Nipple: Everted at rest and after stimulation  Comfort (Breast/Nipple): Soft / non-tender  Interventions (Mild/moderate discomfort): Comfort gels  Hold (Positioning): Assistance needed to correctly position infant at breast and maintain latch. Intervention(s): Skin to skin;Position options;Support Pillows;Breastfeeding basics reviewed  LATCH Score: 8  Lactation Tools Discussed/Used Tools: Nipple Shields Nipple shield size: 20 Breast pump type: Manual   Consult Status Consult Status: Follow-up Date: 09/01/13 Follow-up type: In-patient    Jannifer Rodney 08/31/2013, 8:24 PM

## 2013-08-31 NOTE — Lactation Note (Addendum)
This note was copied from the chart of Alexa Leonard. Lactation Consultation Note  Patient Name: Alexa Leonard NFAOZ'H Date: 08/31/2013 Reason for consult: Follow-up assessment;Difficult latch  Baby at 34 hrs old and day of discharge.  Mom and Dad working on trying to latch baby, but he tends to keep tongue at roof of mouth.  Even with letting baby suckle on Mom's finger prior to latching, baby was having difficulty latching.  Baby rooting and fussy.  Initiated a 20 mm nipple shield, with instructions on use and care.  Baby latched after a few attempts.  Taught Mom how to sandwich and support her breast in a U hold when positioning baby in cross cradle.  Showed Dad how to gentle tug on baby's chin to facilitate a deeper, and wider gape of mouth.  Baby relaxed more and became more rhythmic at the breast.  Recommended couple practice latching baby a few times and feel comfortable before leaving hospital.  Talked about coming back for an OP lactation appointment for follow up.  Appt made for Monday, December 22 @ 1pm (first available).  Mom has a manual breast pump.  Knows how to spoon feed baby colostrum, knows to pre pump to evert nipple and initiate colostrum flow (copious colostrum flow visible).  Engorgement prevention and treatment discussed.  To call office prn.  Comfort Gels given with explanation on care and use.  Consult Status Consult Status: Follow-up Date: 09/05/13 Follow-up type: Out-patient    Judee Clara 08/31/2013, 9:18 AM

## 2013-09-07 ENCOUNTER — Telehealth (HOSPITAL_COMMUNITY): Payer: Self-pay | Admitting: Lactation Services

## 2013-09-13 ENCOUNTER — Ambulatory Visit (HOSPITAL_COMMUNITY)
Admission: RE | Admit: 2013-09-13 | Discharge: 2013-09-13 | Disposition: A | Discharge status: Home / Self Care | Payer: BC Managed Care – PPO | Source: Ambulatory Visit | Attending: Obstetrics and Gynecology | Admitting: Obstetrics and Gynecology

## 2013-09-13 NOTE — Lactation Note (Addendum)
Infant Lactation Consultation Outpatient Visit Note  Patient Name: Alexa Leonard Date of Birth: 07-16-1981 Birth Weight:  7-1 oz  Gestational Age at Delivery: Gestational Age: <None> Type of Delivery:  D/c weight - 6-10 oz  1st weight - 6-15 oz  1 week  ago - 6-15 oz  Reason for F/U today - still using the nipple shield for latching #20 NS , tried #24 NS , Alexa Leonard didn't seem to like it.  Breastfeeding History Frequency of Breastfeeding: every 3 hours for 20 mins one side -  feeding with a nipple shield since the hospital due  to Alexa Leonard's difficult latch due to his recessed chin. Length of Feeding: 20 mins - 25 mins  Voids: >6  Stools: >2-3 yellowish   Supplementing / Method: Pumping:  Type of Pump: DEBP    Frequency:every other feeding ( 3-4 X's per day   Volume:  60-162ml   Comments:    Consultation Evaluation: Both breast full , no nodules or plugged ducts noted  last fed at 625 am for 20 -25 mins , offered 2nd breast 5 mins. Both nipples healthy pink , no breakdown noted   Initial Feeding Assessment: Pre-feed Weight:7-4.8 pz 3312g  Post-feed Weight: 7-6.3 oz 3354 g  Amount Transferred: 42 ml  Comments: Tried latching without the nipple shield , latched , noted to have a shallow latch and non-nutritive sucking pattern , even with breast compressions. Latched with #20 NS and was able to obtain depth and noted Alexa Leonard quickly getting into a swallowing pattern with multiply swallows, increased with breast compressioins.  Per mom comfortable . Fed for 15 -20 mins .   Additional Feeding Assessment: wet and stool diaper changed  Pre-feed Weight:7- 5.9 oz 3344 g  Post-feed Weight: 7-6.3 oz 3354 g  Amount Transferred: 10 ml  Comments:latched with depth for 8 mins and released   Additional Feeding Assessment: wet and stool changed  Pre-feed Weight: 7-5.7 oz 3338 g  Post-feed Weight: 7-5.8 oz 3340 g  Amount Transferred: 2 ml  Comments:latched for a short snack   Total  Breast milk Transferred this Visit: 54 ml  Total Supplement Given: none   Lactation PLan of Care - Praised mom for her breastfeeding efforts                                        - Encouraged mom to rest , nap , plenty fluids , especially water , Nutritious snacks and meals                                        - Feedings - gradually Shifting Alexa Leonard's night feedings into more day and evenings .                                        - wake him up every 2 1/2 - 3 hours during the day / evening                                        - Use the #24 Nipple shield , with syringe 3-4 ml of EBM into the  top for an appetizer to get him started .                                       - Make sure Alexa Leonard has depth at the breast , lips flanged open                                        - Feed 1st breast 15-20 mins until soften and then  offer 2nd breast                                        - Post pump after 3-4 feedings a day for 10 mins during the day                                        - decrease down to 10 mins ( both breast )                                       - Breast compressions with latch and intermittently    Follow-Up- Per mom F/U with [facility] - DR. Rachel Bo 09/29/2013                    - LC recommended weekly weight checks as long as Nipple shield is being used       Alexa Leonard 09/13/2013, 10:28 AM

## 2013-11-17 IMAGING — US US OB FOLLOW-UP
1 series · 12 of 28 positions shown · non-contrast
Comparison: none

[Series 1: us ob follow-up · 12 of 60 slices shown]
[im 3/60]
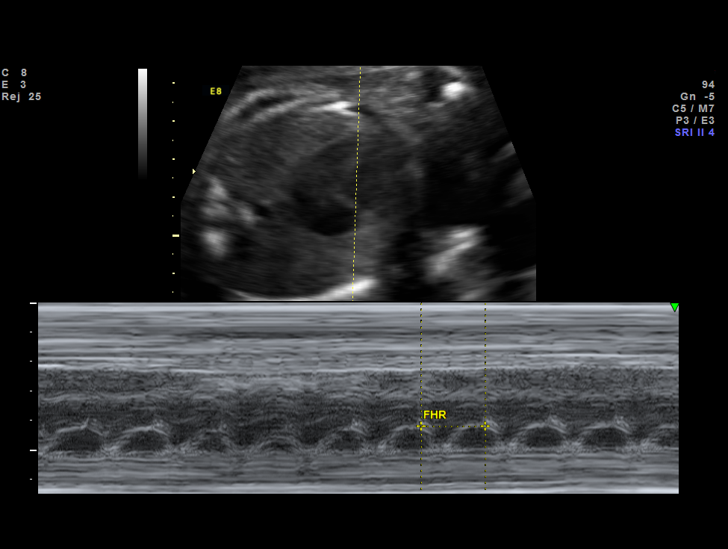
[im 7/60]
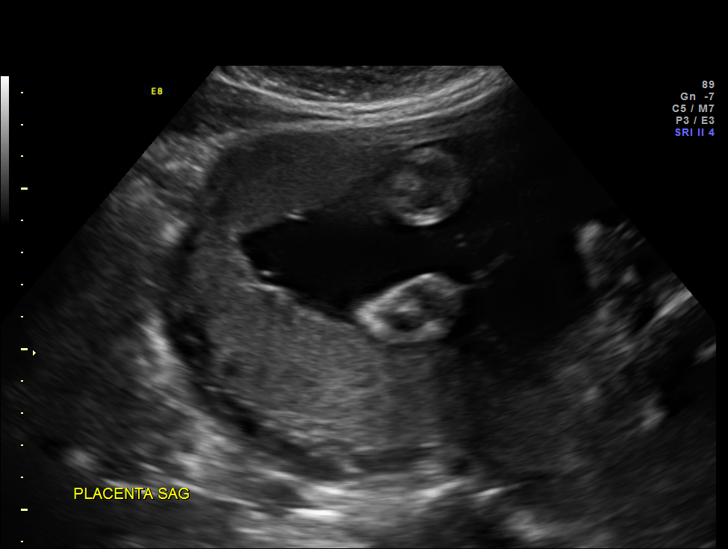
[im 11/60]
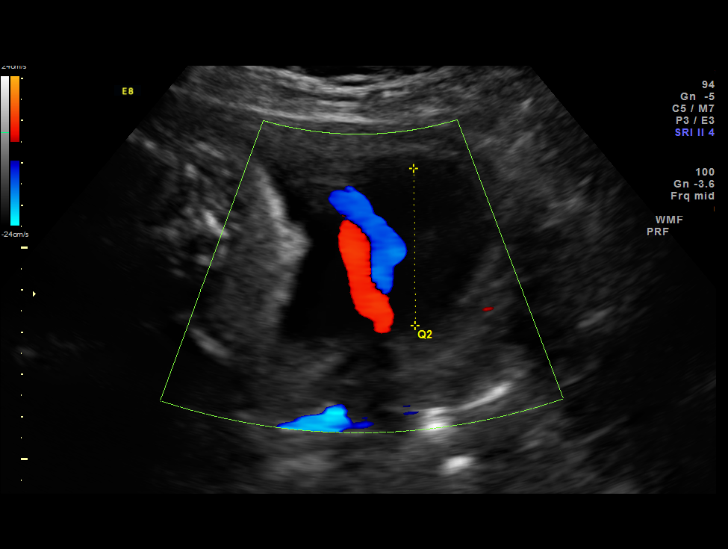
[im 18/60]
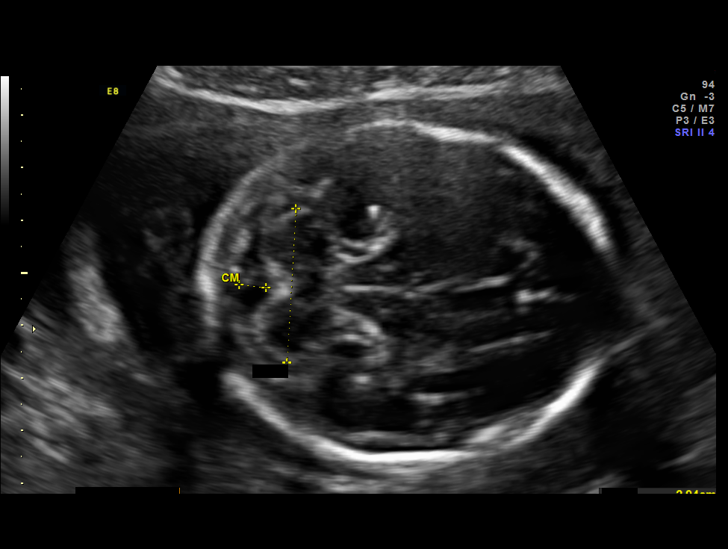
[im 22/60]
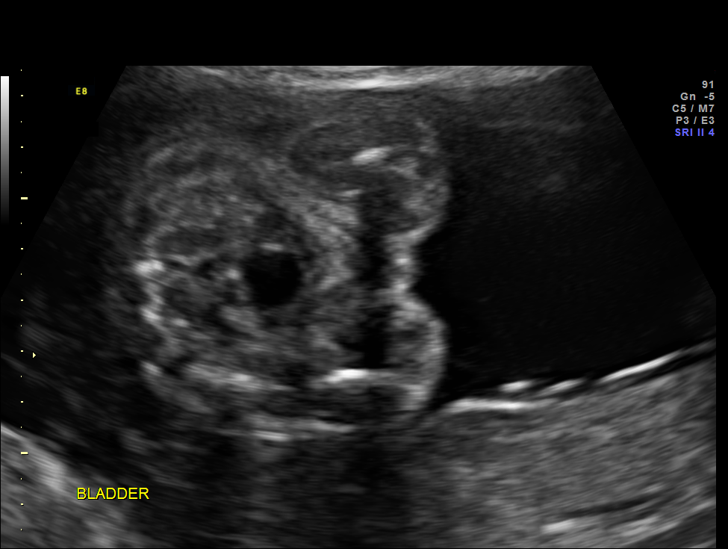
[im 27/60]
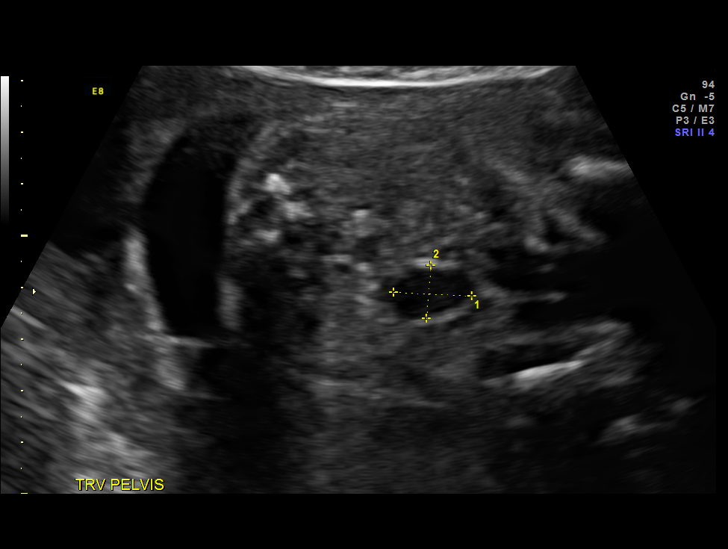
[im 33/60]
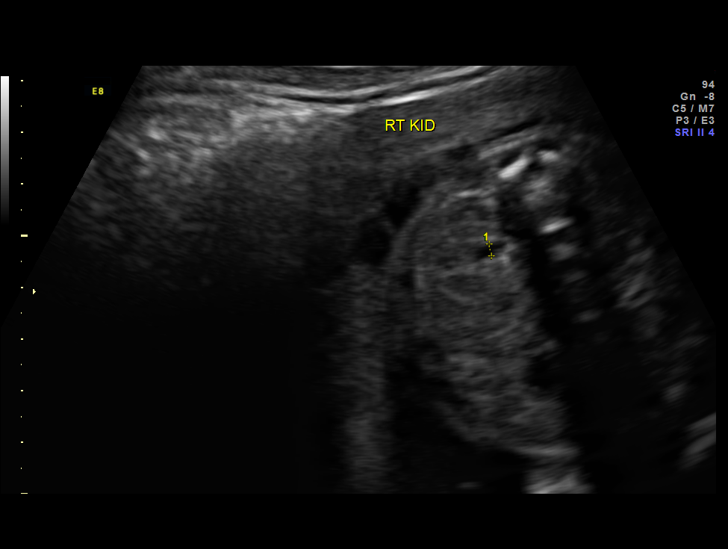
[im 38/60]
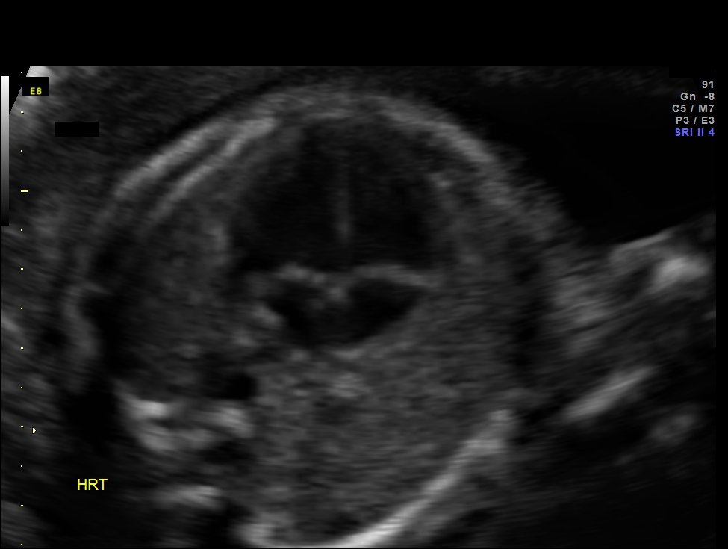
[im 42/60]
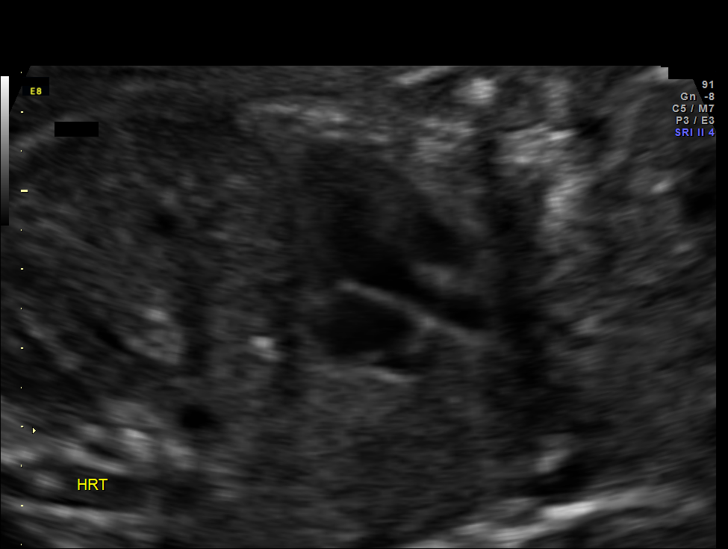
[im 49/60]
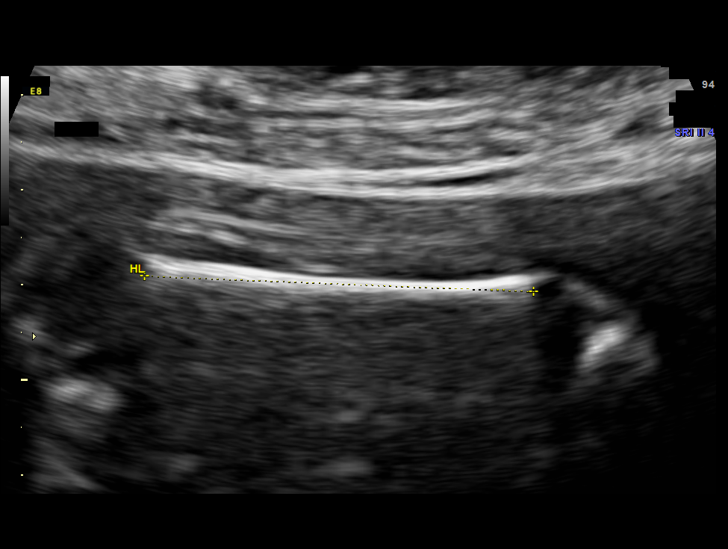
[im 53/60]
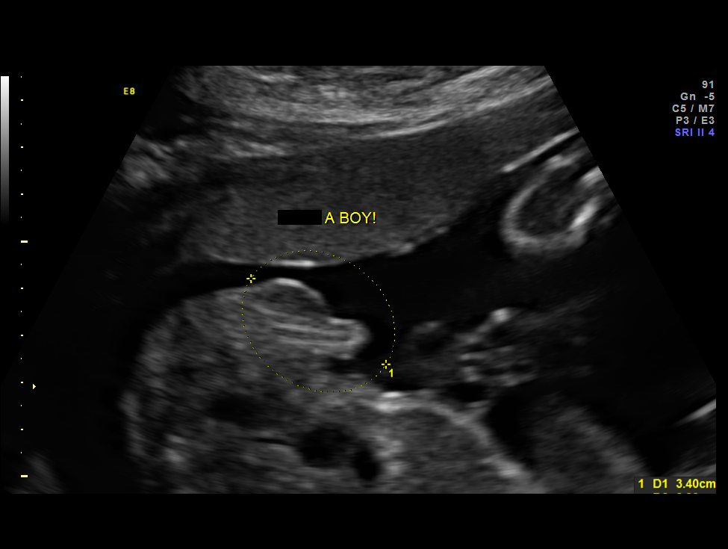
[im 57/60]
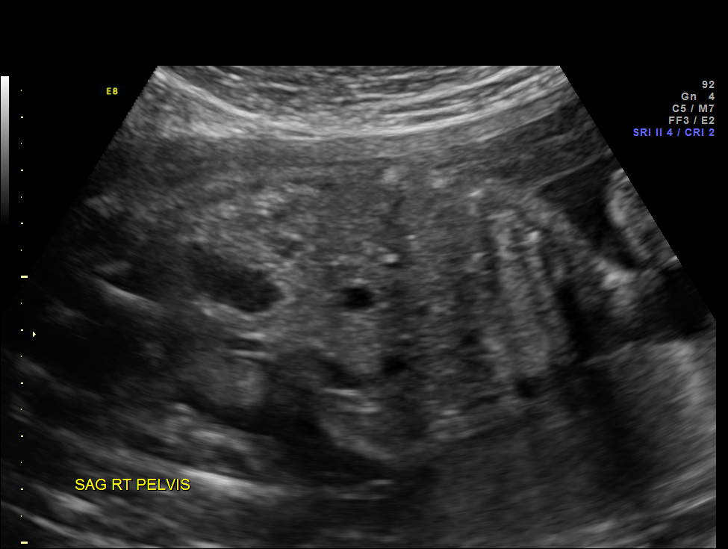

[12 of 28 positions shown; findings below may reference images not displayed]

OBSTETRICS REPORT
                      (Signed Final 05/17/2013 [DATE])

Service(s) Provided

 US OB FOLLOW UP                                       76816.1
Indications

 Follow-up incomplete fetal anatomic evaluation
 Elevated MSAFP (2.71 MoM, OSB [DATE])
 IUI pregnancy
Fetal Evaluation

 Num Of Fetuses:    1
 Fetal Heart Rate:  157                          bpm
 Cardiac Activity:  Observed
 Presentation:      Cephalic
 Placenta:          Posterior, above cervical
                    os
 P. Cord            Previously Visualized
 Insertion:

 Amniotic Fluid
 AFI FV:      Subjectively within normal limits
 AFI Sum:     15.96   cm       58  %Tile     Larg Pckt:    4.81  cm
 RUQ:   3.1     cm   RLQ:    4.81   cm    LUQ:   3.72    cm   LLQ:    4.33   cm
Biometry

 BPD:     63.4  mm     G. Age:  25w 5d                CI:         79.1   70 - 86
 OFD:     80.2  mm                                    FL/HC:      19.2   18.6 -

 HC:     228.5  mm     G. Age:  24w 6d        8  %    HC/AC:      1.06   1.04 -

 AC:     215.3  mm     G. Age:  26w 0d       51  %    FL/BPD:     69.2   71 - 87
 FL:      43.9  mm     G. Age:  24w 3d        8  %    FL/AC:      20.4   20 - 24
 HUM:     40.9  mm     G. Age:  24w 6d       22  %
 CER:     29.4  mm     G. Age:  26w 1d       58  %

 Est. FW:     796  gm    1 lb 12 oz      45  %
Gestational Age

 LMP:           25w 5d        Date:  11/18/12                 EDD:   08/25/13
 U/S Today:     25w 2d                                        EDD:   08/28/13
 Best:          25w 5d     Det. By:  LMP  (11/18/12)          EDD:   08/25/13
Anatomy

 Cranium:          Appears normal         Aortic Arch:      Previously seen
 Fetal Cavum:      Appears normal         Ductal Arch:      Previously seen
 Ventricles:       Appears normal         Diaphragm:        Previously seen
 Choroid Plexus:   Previously seen        Stomach:          Appears normal
 Cerebellum:       Appears normal         Abdomen:          Appears normal
 Posterior Fossa:  Appears normal         Abdominal Wall:   Previously seen
 Nuchal Fold:      Previously seen        Cord Vessels:     Previously seen
 Face:             Orbits and profile     Kidneys:          Appear normal
                   previously seen
 Lips:             Previously seen        Bladder:          Appears normal
 Heart:            Appears normal         Spine:            Previously seen
                   (4CH, axis, and
                   situs)
 RVOT:             Appears normal         Lower             Previously seen
                                          Extremities:
 LVOT:             Appears normal         Upper             Previously seen
                                          Extremities:

 Other:  Fetus appears to be a male. Heels and 5th digit previously visualized.
         Nasal bone previously visualized.
Targeted Anatomy

 Fetal Central Nervous System
 Cisterna Magna:
Cervix Uterus Adnexa

 Cervical Length:    4.4      cm

 Cervix:       Normal appearance by transabdominal scan.
 Left Ovary:    Within normal limits.
 Right Ovary:   Within normal limits.

 Adnexa:     No abnormality visualized.
Impression

 Single IUP at 25 [DATE] weeks
 Elevated MSAFP (2.71 MoM)
 There appears to be an isolated, dilated loop of bowel
 measuring 1.2 cm in diameter in the right pelvis
 the remainder of the fetal anatomy appears normal
 Interval growth is appropriate (45th %tile)
 Normal amniotic fluid volume
Recommendations

 Recommend follow-up ultrasound examination [place] 4 weeks
 for interval growth and to reevaluate anatomy.

 questions or concerns.

## 2013-12-15 IMAGING — US US OB FOLLOW-UP
1 series · 12 of 28 positions shown · non-contrast
Comparison: none

[Series 1: us ob follow-up · 12 of 40 slices shown]
[im 2/40]
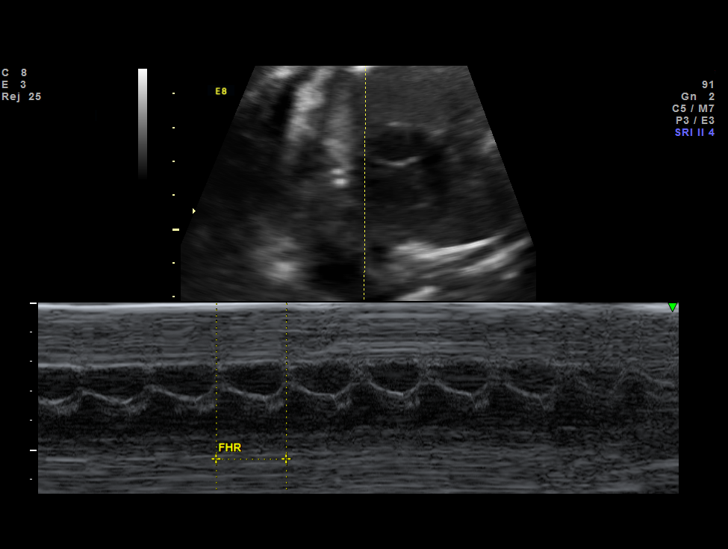
[im 5/40]
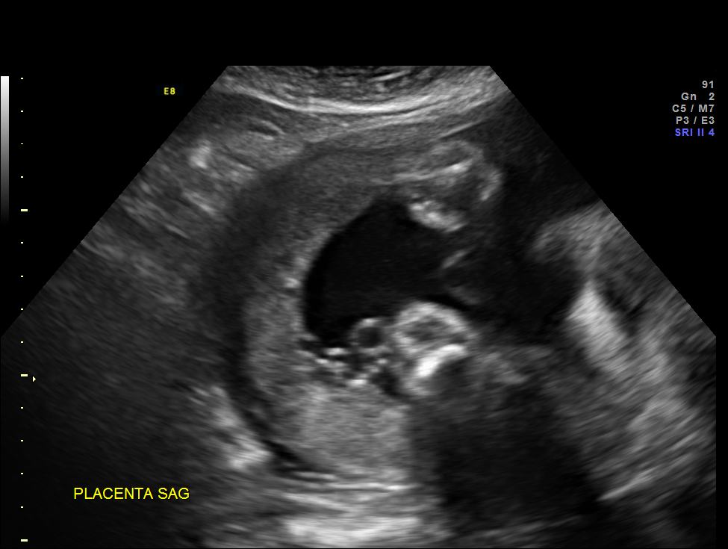
[im 8/40]
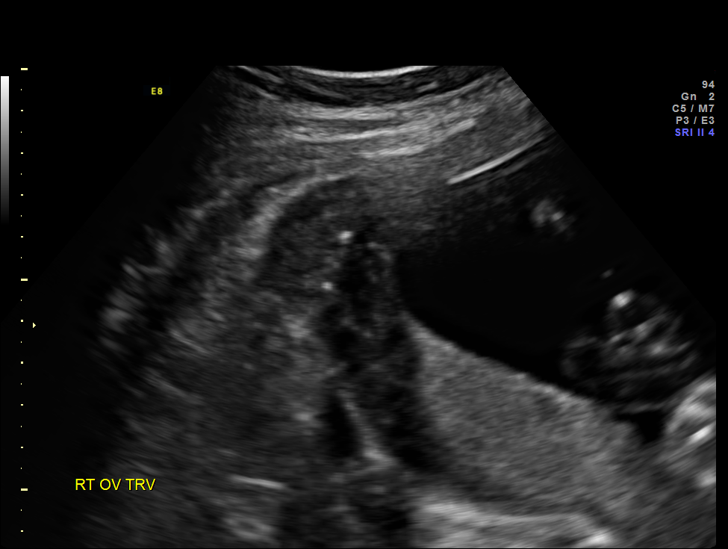
[im 12/40]
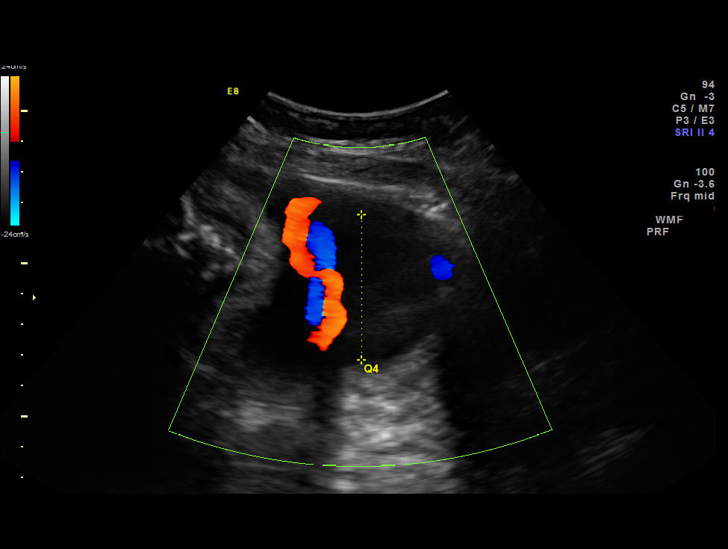
[im 15/40]
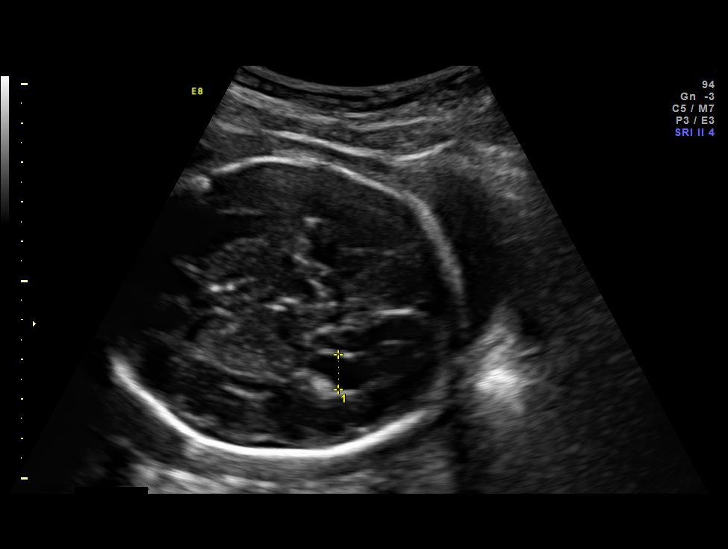
[im 18/40]
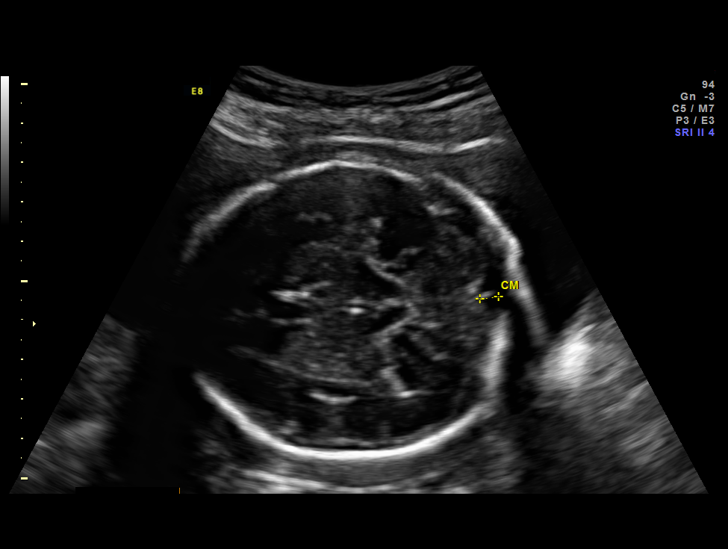
[im 22/40]
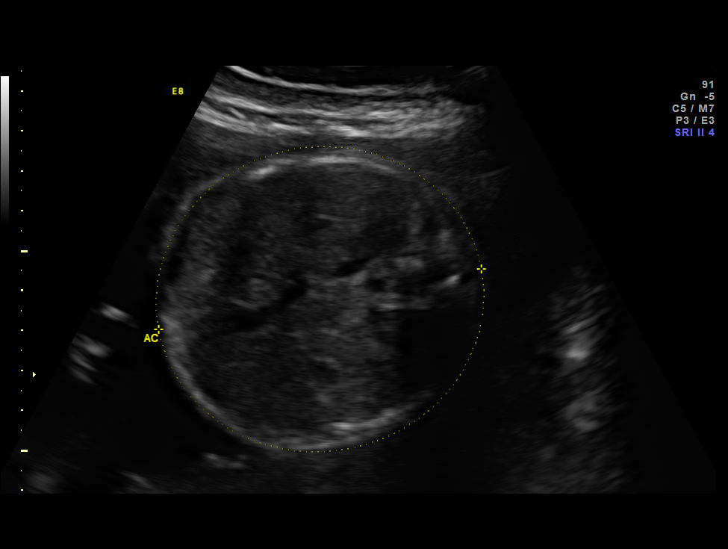
[im 25/40]
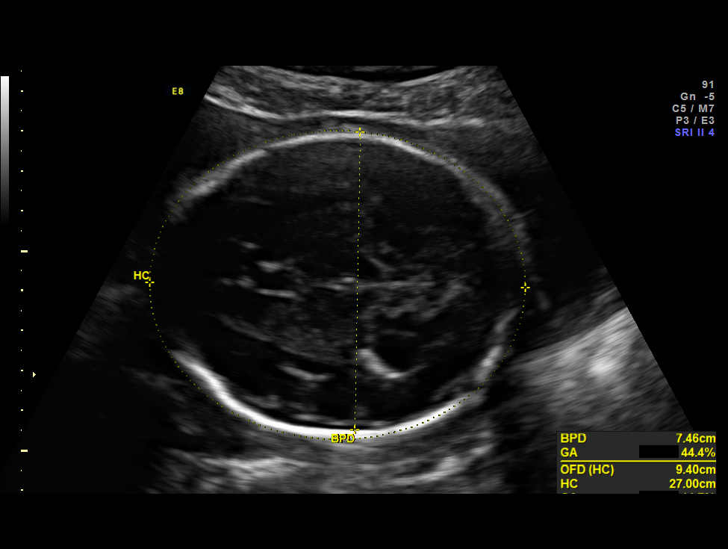
[im 28/40]
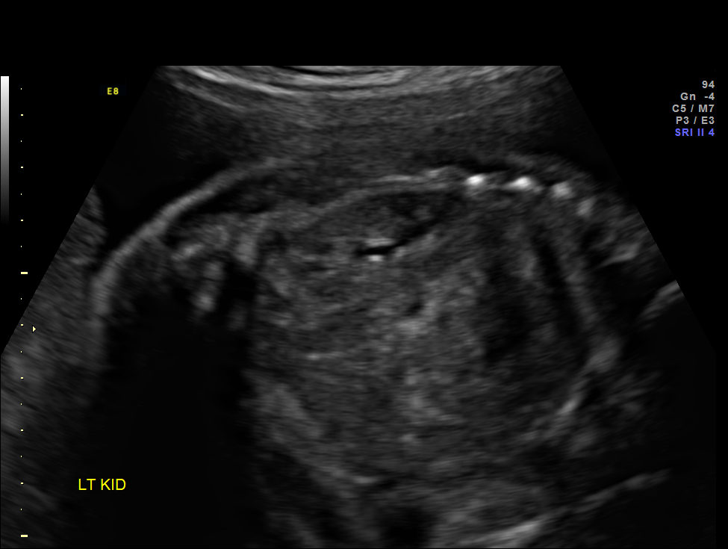
[im 32/40]
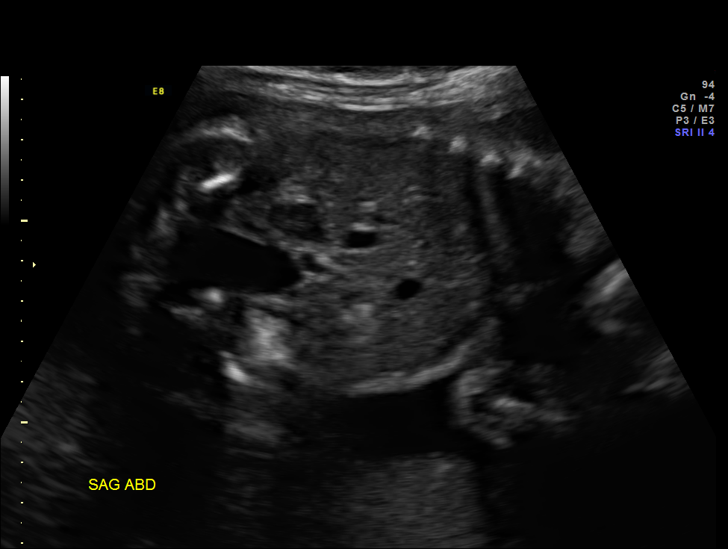
[im 35/40]
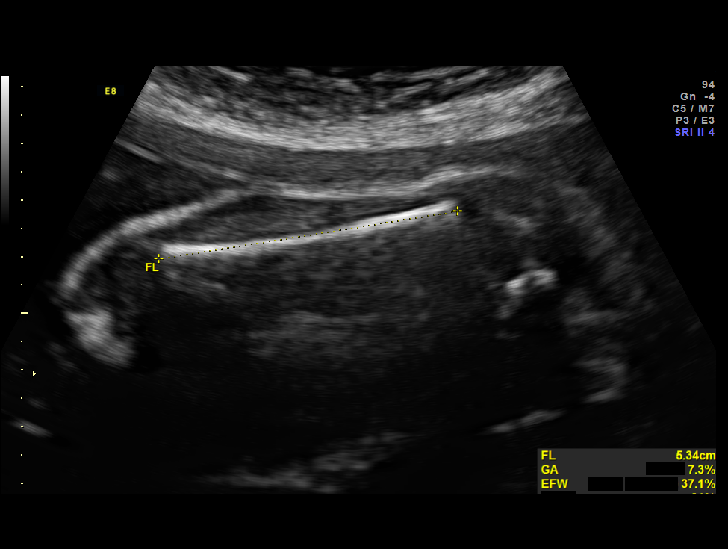
[im 38/40]
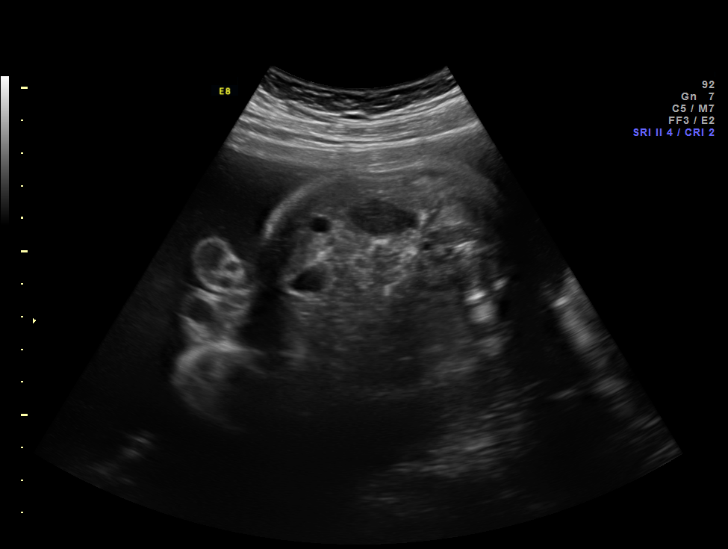

[12 of 28 positions shown; findings below may reference images not displayed]

OBSTETRICS REPORT
                      (Signed Final 06/14/2013 [DATE])

Service(s) Provided

 US OB FOLLOW UP                                       76816.1
Indications

 Elevated MSAFP (2.71 MoM, OSB [DATE])
 IUI pregnancy
Fetal Evaluation

 Num Of Fetuses:    1
 Fetal Heart Rate:  144                          bpm
 Cardiac Activity:  Observed
 Presentation:      Cephalic
 Placenta:          Posterior Fundal, above
                    cervical os
 P. Cord            Previously Visualized
 Insertion:

 Amniotic Fluid
 AFI FV:      Subjectively within normal limits
 AFI Sum:     21.78   cm       87  %Tile     Larg Pckt:    7.42  cm
 RUQ:   6.5     cm   RLQ:    4.76   cm    LUQ:   7.42    cm   LLQ:    3.1    cm
Biometry

 BPD:     74.8  mm     G. Age:  30w 0d                CI:         81.2   70 - 86
 OFD:     92.1  mm                                    FL/HC:      20.1   19.2 -

 HC:     266.8  mm     G. Age:  29w 1d        7  %    HC/AC:      1.07   0.99 -

 AC:     249.2  mm     G. Age:  29w 1d       28  %    FL/BPD:     71.8   71 - 87
 FL:      53.7  mm     G. Age:  28w 3d        9  %    FL/AC:      21.5   20 - 24
 HUM:     49.5  mm     G. Age:  29w 1d       36  %
 CER:     35.7  mm     G. Age:  30w 5d       64  %

 Est. FW:    9697  gm    2 lb 14 oz      37  %
Gestational Age

 LMP:           29w 5d        Date:  11/18/12                 EDD:   08/25/13
 U/S Today:     29w 1d                                        EDD:   08/29/13
 Best:          29w 5d     Det. By:  LMP  (11/18/12)          EDD:   08/25/13
Anatomy

 Cranium:          Appears normal         Aortic Arch:      Previously seen
 Fetal Cavum:      Appears normal         Ductal Arch:      Previously seen
 Ventricles:       Appears normal         Diaphragm:        Previously seen
 Choroid Plexus:   Previously seen        Stomach:          Appears normal
 Cerebellum:       Appears normal         Abdomen:          Dilated bowel
                                                            cm
 Posterior Fossa:  Appears normal         Abdominal Wall:   Previously seen
 Nuchal Fold:      Previously seen        Cord Vessels:     Previously seen
 Face:             Orbits and profile     Kidneys:          Appear normal
                   previously seen
 Lips:             Previously seen        Bladder:          Appears normal
 Heart:            Appears normal         Spine:            Previously seen
                   (4CH, axis, and
                   situs)
 RVOT:             Previously seen        Lower             Previously seen
                                          Extremities:
 LVOT:             Previously seen        Upper             Previously seen
                                          Extremities:

 Other:  Fetus appears to be a male. Heels and 5th digit previously visualized.
         Nasal bone previously visualized.
Targeted Anatomy

 Fetal Central Nervous System
 Cisterna Magna:
Cervix Uterus Adnexa

 Cervical Length:    4.2      cm

 Cervix:       Normal appearance by transabdominal scan.

 Left Ovary:    Within normal limits.
 Right Ovary:   Within normal limits.
 Adnexa:     No abnormality visualized.
Impression

 IUP at 29+5 weeks
 Normal interval anatomy; anatomic survey complete; spine,
 posterior fossa and CI remain normal in appeareance; small
 dilated segment of bowel has not changed since last study
 Normal amniotic fluid volume
 Appropriate interval growth with EFW at the 37th %tile (HC
 and FL < 10th %tile)
Recommendations

 Follow-up ultrasound for growth in 4 weeks

 questions or concerns.

## 2014-01-12 IMAGING — US US OB FOLLOW-UP
1 series · 12 of 28 positions shown · non-contrast
Comparison: none

[Series 1: us ob follow-up · 0.23mm/px · 12 of 52 slices shown]
[im 2/52]
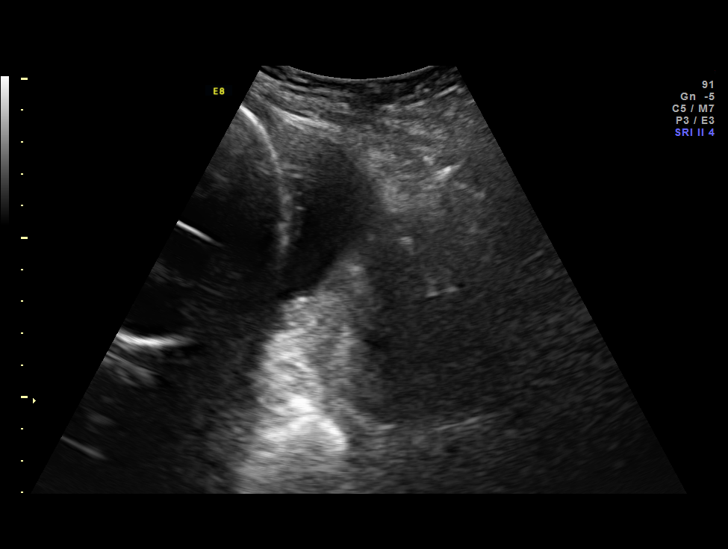
[im 6/52]
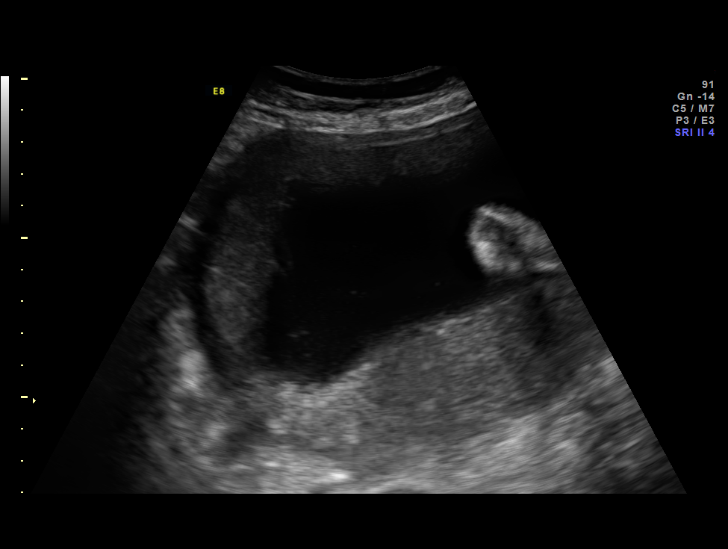
[im 10/52]
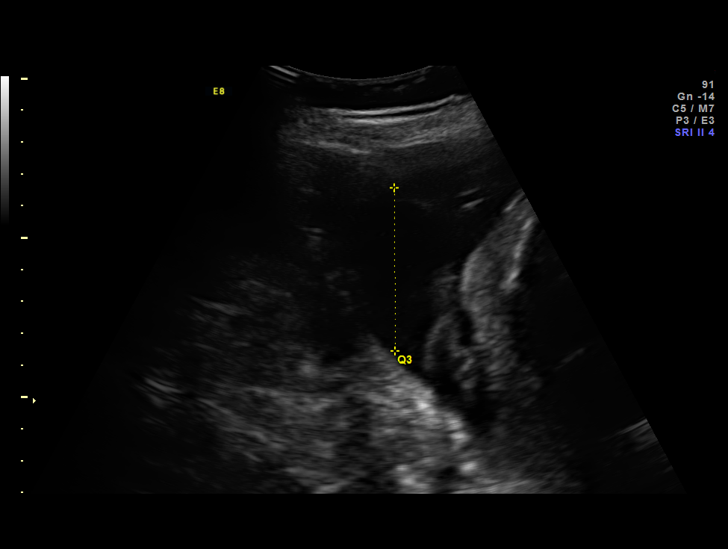
[im 16/52]
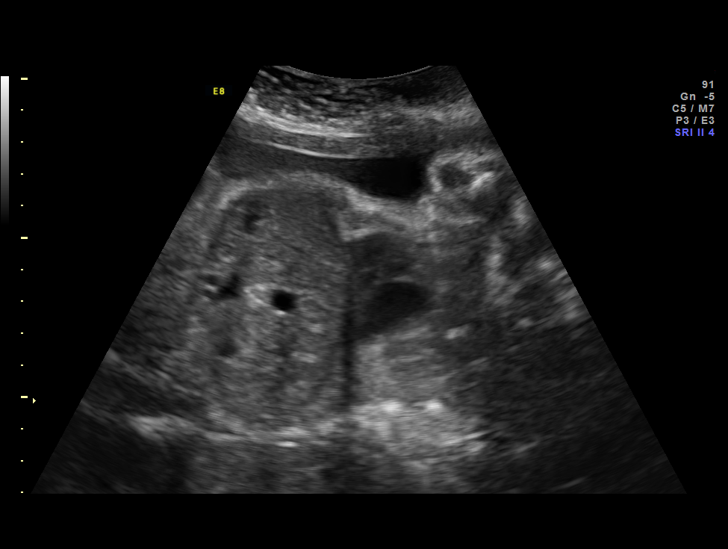
[im 19/52]
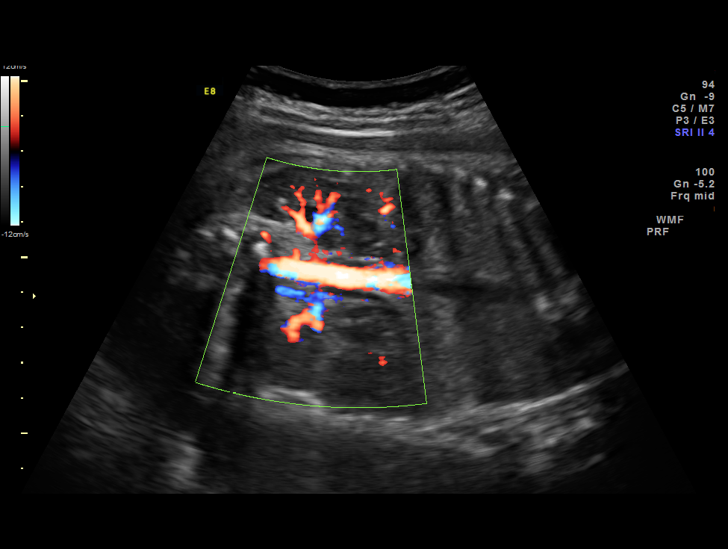
[im 23/52]
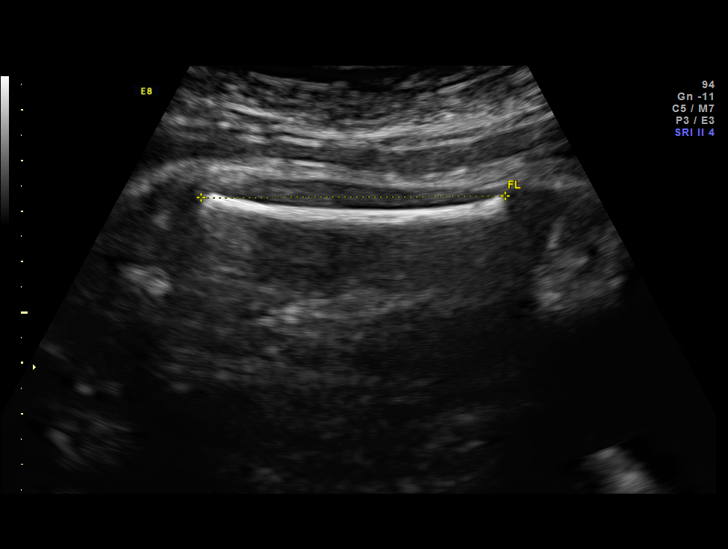
[im 29/52]
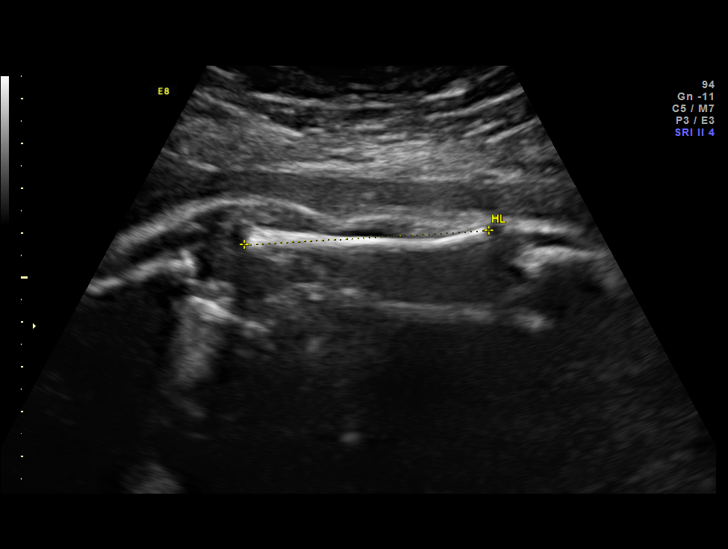
[im 33/52]
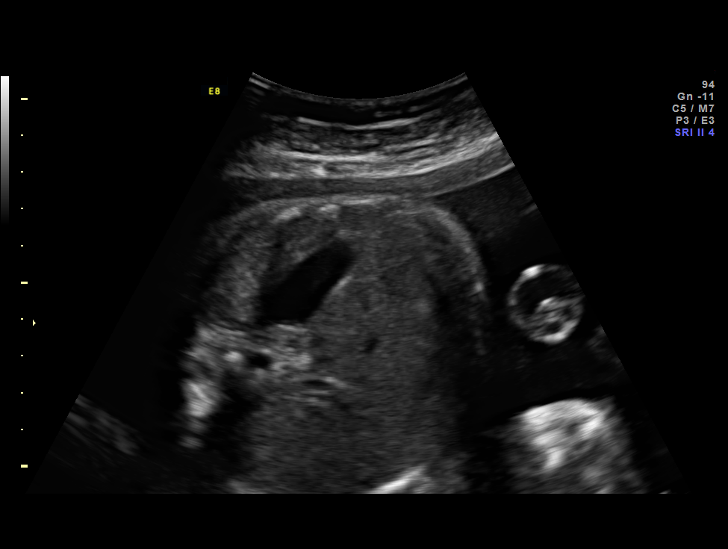
[im 36/52]
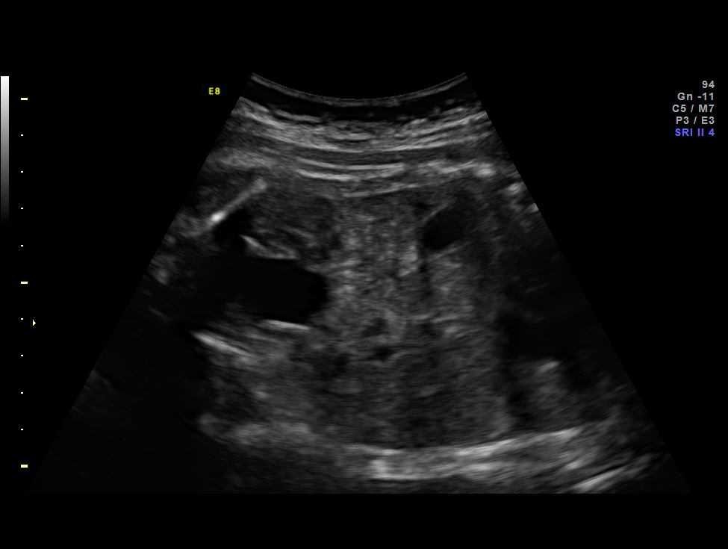
[im 42/52]
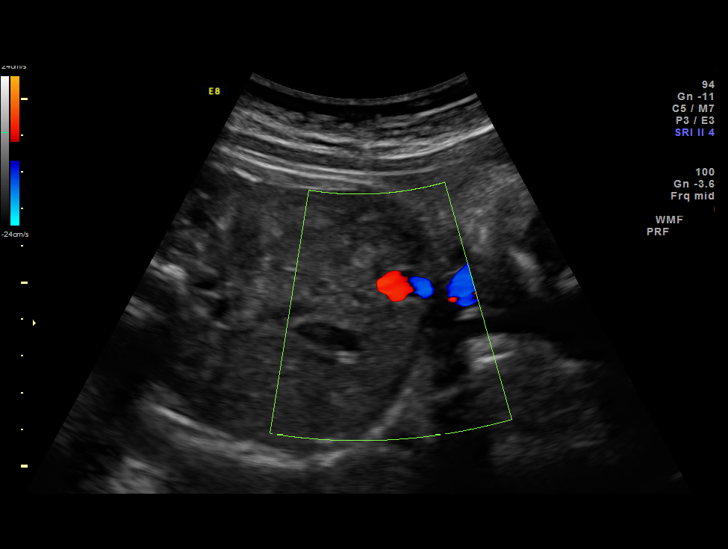
[im 46/52]
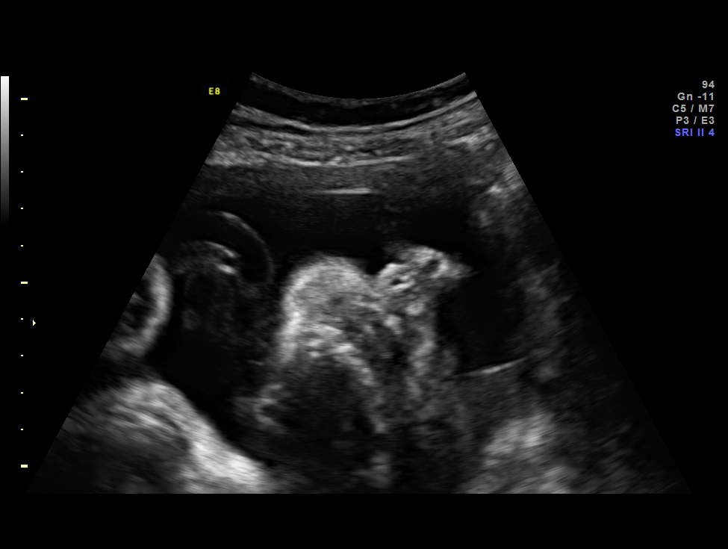
[im 50/52]
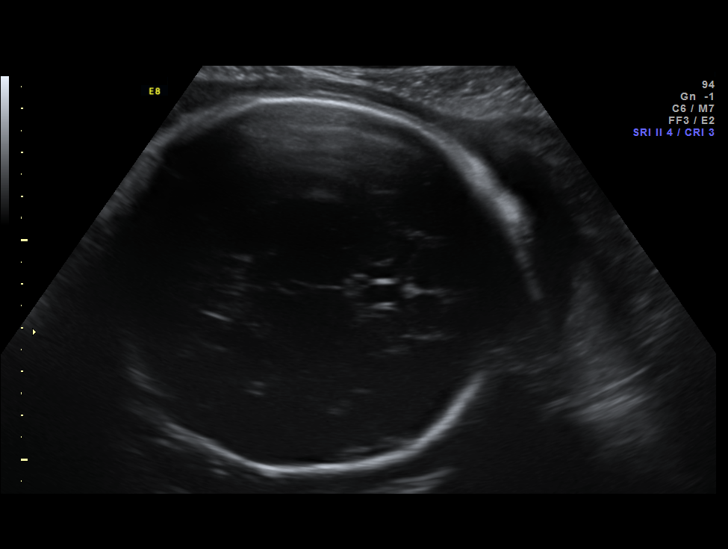

[12 of 28 positions shown; findings below may reference images not displayed]

OBSTETRICS REPORT
                      (Signed Final 07/12/2013 [DATE])

Service(s) Provided

 US OB FOLLOW UP                                       76816.1
Indications

 Elevated MSAFP (2.71 MoM, OSB [DATE])
 IUI pregnancy
Fetal Evaluation

 Num Of Fetuses:    1
 Fetal Heart Rate:  141                          bpm
 Cardiac Activity:  Observed
 Presentation:      Cephalic
 Placenta:          Posterior Fundal, above
                    cervical os
 P. Cord            Previously Visualized
 Insertion:

 Amniotic Fluid
 AFI FV:      Subjectively within normal limits
 AFI Sum:     20.58   cm       77  %Tile     Larg Pckt:    6.45  cm
 RUQ:   6.45    cm   RLQ:    5.5    cm    LUQ:   3.51    cm   LLQ:    5.12   cm
Biometry

 BPD:     83.7  mm     G. Age:  33w 5d                CI:         76.4   70 - 86
 OFD:    109.6  mm                                    FL/HC:      19.3   19.4 -

 HC:     309.6  mm     G. Age:  34w 4d       36  %    HC/AC:      1.04   0.96 -

 AC:     298.4  mm     G. Age:  33w 6d       56  %    FL/BPD:     71.3   71 - 87
 FL:      59.7  mm     G. Age:  31w 1d      < 3  %    FL/AC:      20.0   20 - 24
 HUM:     54.6  mm     G. Age:  31w 5d       18  %

 Est. FW:    8181  gm    4 lb 11 oz      47  %
Gestational Age

 LMP:           33w 5d        Date:  11/18/12                 EDD:   08/25/13
 U/S Today:     33w 2d                                        EDD:   08/28/13
 Best:          33w 5d     Det. By:  LMP  (11/18/12)          EDD:   08/25/13
Anatomy
 Cranium:          Previously seen        Aortic Arch:      Previously seen
 Fetal Cavum:      Previously seen        Ductal Arch:      Previously seen
 Ventricles:       Appears normal         Diaphragm:        Appears normal
 Choroid Plexus:   Previously seen        Stomach:          Appears normal, left
                                                            sided
 Cerebellum:       Previously seen        Abdomen:          Appears normal
 Posterior Fossa:  Previously seen        Abdominal Wall:   Previously seen
 Nuchal Fold:      Previously seen        Cord Vessels:     Appears normal (3
                                                            vessel cord)
 Face:             Appears normal         Kidneys:          Appear normal
                   (orbits and profile)
 Lips:             Appears normal         Bladder:          Appears normal
 Heart:            Appears normal         Spine:            Previously seen
                   (4CH, axis, and
                   situs)
 RVOT:             Previously seen        Lower             Previously seen
                                          Extremities:
 LVOT:             Appears normal         Upper             Previously seen
                                          Extremities:

 Other:  Fetus appears to be a male. Heels and 5th digit previously visualized.
         Nasal bone previously visualized.
Cervix Uterus Adnexa

 Cervical Length:    4.4      cm

 Cervix:       Normal appearance by transabdominal scan. Appears
               closed, without funnelling.
Impression

 IUP at 33+5 weeks
 Normal interval anatomy; anatomic survey complete; bowel
 appeared normal today - no dilated segment
 Normal amniotic fluid volume
 Appropriate interval growth with EFW at the 47th %tile
Recommendations

 Follow-up as clinically indicated

 questions or concerns.

## 2014-07-17 ENCOUNTER — Encounter (HOSPITAL_COMMUNITY): Payer: Self-pay

## 2014-09-28 ENCOUNTER — Telehealth (HOSPITAL_COMMUNITY): Payer: Self-pay | Admitting: Lactation Services

## 2014-09-30 NOTE — Telephone Encounter (Signed)
There is no further documentation needed for this patient 

## 2014-09-30 NOTE — Telephone Encounter (Deleted)
NA

## 2014-09-30 NOTE — Telephone Encounter (Deleted)
No notes needed

## 2014-09-30 NOTE — Telephone Encounter (Signed)
There is no further documentation needed for this patient

## 2015-12-18 ENCOUNTER — Ambulatory Visit

## 2015-12-18 NOTE — Telephone Encounter (Signed)
Phone Note -       Initial call taken by: Wendall Papa RN,  December 18, 2015 4:16 PM  Initial Details of Call:  called gloria at [facility] to get NIPT results. She says they have not resulted yet.  I will track      Follow-up #1  Details: received report-negative-boy  Action: Phone call completed  By: Wendall Papa RN ~ December 19, 2015 1:38 PM                   Global Obstetrical Plan: Paige [name] is a 89 YOMF G 3, P 1 (08/29/13 NVSD Boy)  [dd] EDC 06/10/16 by LMP                          [dd] Flu 05/2015                                                                   [xxx]Tdap   [dd] AMA/ERA-NIPT-Neg/ BOY  [xxx]MSAFP only @ 16 weeks   [xxx] 18 wks FS @ 01/09/16 at 10:45 am [facility]  HGBEG- Neg  HSV 1&2- neg  Hep panels- Neg  Rubella - Imm  varicella- Imm  Parvavirus -unknown   Toxo - Neg  Adv otc ca++  blood type - A+  CF,SMA,Fragile X- [dd] Counsyl drawn 10/31/15-NEGATIVE  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 Ernest Haber  EPDS 3  Preferred Lab - N/A  [dd] Prog until 12 wks 11/28/15  [xxx] Metformin until 12/12/15

## 2015-12-19 ENCOUNTER — Ambulatory Visit

## 2015-12-26 ENCOUNTER — Ambulatory Visit: Admitting: Obstetrics & Gynecology

## 2015-12-26 ENCOUNTER — Ambulatory Visit

## 2015-12-26 NOTE — Telephone Encounter (Signed)
Phone Note -       Initial call taken by: Malachy Chamber MD,  December 26, 2015 7:49 AM  Initial Details of Call:  Tinnie Gens  need GS @ 28-32 weeks      Follow-up #1  Details: requested at [facility]  Action: Phone call completed  By: Wendall Papa RN ~ December 26, 2015 3:28 PM
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
## 2015-12-26 NOTE — Progress Notes (Signed)
[facility]   [address]   [place], [place] [address]  Office: [phone] Fax: [phone]      [facility]  Date:   December 26, 2015                                                         Follow-up:XXXX   McBride MR#: ___________  Patient Information:   Name: Paige [name]       Phone:[phone]       Cell:  Address: [address]      [place], [place]        Zip:  [address]  D.O.B.: [birth year]  SS#: [id number]                                                                                                                                                                                                                                                 Insurance:  [employer]                   Insurance ID:   [id number]                                                                                         Provider Information:   Name:       Dorna Bloom                                                                                  Phone/Fax:  Address:                                           City:                                    Zip:  Building control surveyor (check all that apply):  XXXX U/S- needs growth scan between 28-32 wks             _XXX_ Schedule Follow Up US if indicated     Preferred Site: __________ [facility]     Interpreter needed:   no                 Language:  Medical Information:   LMP:09/04/2015     EDC: 06/10/16            Blood Type:    A+             Multiple Gestation:  OB History : G 3 P 1   SAB     TAB       Ectopic   Reason for Appointment/Comments:   Please fax these records: __ All ultrasounds(current pregnancy) ___ Current ABO   ___Down Syndrome Screen ( pending / declined) ___ CBC w/MCV( pending/declined)   ___ CF ( pending/declined)___ Sickle cell/ Hgb Electrophoresis (pending/ decline)   ___ Alfonzo Feller ( pending/ decline) * if any positives, include Fob's screening results     [place] internal use only:   Date of appnt :                    Time of GC:                 Time of  U/S:            Date scheduled :   OB office confirmation:         spoke to:                    Interpreter:                PSC initials:  Follow up:                          Initials:                           Comments:   Follow up:                          Initials:                           Comments:   Follow up:                          Initials:                           Comments:

## 2015-12-28 ENCOUNTER — Ambulatory Visit

## 2015-12-30 ENCOUNTER — Ambulatory Visit: Admitting: Obstetrics & Gynecology

## 2015-12-30 LAB — HX MATERNAL SERUM AFP
HX MSAFP ABSOLUTE AFP: 34.4 ng/mL
HX MSAFP AGE AT TERM: 37.1
HX MSAFP EXPECTED AFP: <2.5
HX MSAFP FETUS COUNT: 1
HX MSAFP GESTATIONAL AGE: 21.1 wk
HX MSAFP RELATIVE AFP: 0.54 {M.o.M}
HX MSAFP RESULT A: NEGATIVE
HX MSAFP WEIGHT: 150 [lb_av]

## 2015-12-30 NOTE — Progress Notes (Signed)
Clinical Lists Changes    Observations:  Added new observation of OBCOMMENTS: [name],P1[name]) [dd] Flu 05/2015  [dd] EDC 06/10/16 by LMP                                        [xxx]Tdap   [dd] AMA/ERA-NIPT-Neg/ BOY  [dd]MSAFP only=12/26/2015 0.54 MOM  [xxx] 18 wks FS @ 01/09/16 at 10:45 am [facility]  [xxx]GS @ 28-32 weeks  HGBEG- Neg  HSV 1&2- neg  Hep panels- Neg  Rubella - Imm  varicella- Imm  Parvavirus -unknown   Toxo - Neg  Adv otc ca++  blood type - A+  CF,SMA,Fragile X- [dd] Counsyl drawn 10/31/15-NEGATIVE  [employer]  PCP Dr. Ernest Haber  EPDS 3  Preferred Lab - N/A  [dd] Prog until 12 wks 11/28/15  [dd] Metformin until 12/12/15 (12/30/2015 15:26)               Global Obstetrical Plan: [name],P1[name]) [dd] Flu 05/2015  [dd] EDC 06/10/16 by LMP                                        [xxx]Tdap   [dd] AMA/ERA-NIPT-Neg/ BOY  [dd]MSAFP only=12/26/2015 0.54 MOM  [xxx] 18 wks FS @ 01/09/16 at 10:45 am [facility]  [xxx]GS @ 28-32 weeks  HGBEG- Neg  HSV 1&2- neg  Hep panels- Neg  Rubella - Imm  varicella- Imm  Parvavirus -unknown   Toxo - Neg  Adv otc ca++  blood type - A+  CF,SMA,Fragile X- [dd] Counsyl drawn 10/31/15-NEGATIVE  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 Ernest Haber  EPDS 3  Preferred Lab - N/A  [dd] Prog until 12 wks 11/28/15  [dd] Metformin until 12/12/15

## 2016-01-09 ENCOUNTER — Ambulatory Visit

## 2016-01-09 NOTE — Progress Notes (Signed)
[facility]   [address]   [place], [place] [address]  Office: [phone] Fax: [phone]      January 09, 2016  KYILEE GREGG  [address]  [place], [place] [address]     Dear Ms. Rockman,  Your last fetal survey done on 01/09/16, was incomplete. You were informed today that you need to have a follow up ultrasound to complete the fetal survey. This letter is to remind you to keep this very important appointment.    Appointment Date: 01/23/16 @ 4:15pm     LOCATION:  [facility]                            [address]                             [place], [place] [address]    This is the medical office building next to [facility]- on the second floor.    This appointment runs on time. If you are unable to keep your appointment or are running late, you MUST call [phone] to reschedule.   If you reschedule this appointment, please notify our office to notify us of your new appointment date so that we may keep track of it.   Thank you for your attention. Please call if you have any questions or concerns at [phone].     Sincerely,     Richarda Osmond. Dorna Bloom, MD

## 2016-01-09 NOTE — Progress Notes (Signed)
Clinical Lists Changes    Observations:  Added new observation of OBCOMMENTS: [name],P1[name]) [dd] Flu 05/2015  [dd] EDC 06/10/16 by LMP                                        [xxx]Tdap   [dd] AMA/ERA-NIPT-Neg/ BOY  [dd]MSAFP only=12/26/2015 0.54 MOM  [xxx] 18 wks FS @ 01/09/16 [facility]-incomplete views of spine- f/u to complete survey-01/23/16 @ 4:15pm [facility]   [xxx]GS @ 28-32 weeks-04/16/16 @ 3:30pm [facility]  HGBEG- Neg  HSV 1&2- neg  Hep panels- Neg  Rubella - Imm  varicella- Imm  Parvavirus -unknown   Toxo - Neg  Adv otc ca++  blood type - A+  CF,SMA,Fragile X- [dd] Counsyl drawn 10/31/15-NEGATIVE  [employer] Paige [name]  PCP Dr. Ernest Haber  EPDS 3  Preferred Lab - N/A  [dd] Prog until 12 wks 11/28/15  [dd] Metformin until 12/12/15 (01/09/2016 15:32)               Global Obstetrical Plan: [name],P1[name]) [dd] Flu 05/2015  [dd] EDC 06/10/16 by LMP                                        [xxx]Tdap   [dd] AMA/ERA-NIPT-Neg/ BOY  [dd]MSAFP only=12/26/2015 0.54 MOM  [xxx] 18 wks FS @ 01/09/16 [facility]-incomplete views of spine- f/u to complete survey-01/23/16 @ 4:15pm [facility]   [xxx]GS @ 28-32 weeks-04/16/16 @ 3:30pm 50 [facility]  HGBEG- Neg  HSV 1&2- neg  Hep panels- Neg  Rubella - Imm  varicella- Imm  Parvavirus -unknown   Toxo - Neg  Adv otc ca++  blood type - A+  CF,SMA,Fragile X- [dd] Counsyl drawn 10/31/15-NEGATIVE  [employer] Paige [name]  PCP Dr. Ernest Haber  EPDS 3  Preferred Lab - N/A  [dd] Prog until 12 wks 11/28/15  [dd] Metformin until 12/12/15

## 2016-01-10 ENCOUNTER — Ambulatory Visit

## 2016-01-10 NOTE — Progress Notes (Signed)
[facility]   [address]   [place], [place] [address]  Office: [phone] Fax: [phone]      January 10, 2016  Paige [name]  [address]  [place], [place] [address]     Dear Ms. Paige [name],  You have been scheduled for a 3rd trimester growth scan ultrasound on:  04/16/16 @ 3:30pm    This letter is to remind you to keep this very important appointment.    LOCATION:  [facility]                            [address], [place] [address]    This is the medical office building next to [facility]- on the second floor.  This appointment runs on time. If you are unable to keep your appointment or are running late, you MUST call [phone] to reschedule.   If you reschedule this appointment, please notify our office to notify us of your new appointment date so that we may keep track of it.   Thank you for your attention. Please call if you have any questions or concerns at [phone].     Sincerely,     Richarda Osmond. Dorna Bloom, MD

## 2016-01-23 ENCOUNTER — Ambulatory Visit

## 2016-01-23 ENCOUNTER — Ambulatory Visit: Admitting: Obstetrics & Gynecology
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
## 2016-01-24 ENCOUNTER — Ambulatory Visit

## 2016-01-24 NOTE — Progress Notes (Signed)
Clinical Lists Changes    Observations:  Added new observation of OBCOMMENTS: Paige [name],P1[name]) [dd] Flu 05/2015  [dd] EDC 06/10/16 by LMP                                        [xxx]Tdap   [dd] AMA/ERA-NIPT-Neg/ BOY  [dd]MSAFP only=12/26/2015 0.54 MOM  [dd] 18 wks FS @ 01/09/16 [facility]-incomplete views of spine- f/u to complete survey-01/23/16 50 [facility]-COMPLETE NL FS   [xxx]GS @ 28-32 weeks-04/16/16 @ 3:30pm 50 [facility]  HGBEG- Neg  HSV 1&2- neg  Hep panels- Neg  Rubella - Imm  varicella- Imm  Parvavirus -unknown   Toxo - Neg  Adv otc ca++  blood type - A+  CF,SMA,Fragile X- [dd] Counsyl drawn 10/31/15-NEGATIVE  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 Ernest Haber  EPDS 3  Preferred Lab - N/A  [dd] Prog until 12 wks 11/28/15  [dd] Metformin until 12/12/15 (01/24/2016 15:21)               Global Obstetrical Plan: Paige [name],P1[name]) [dd] Flu 05/2015  [dd] EDC 06/10/16 by LMP                                        [xxx]Tdap   [dd] AMA/ERA-NIPT-Neg/ BOY  [dd]MSAFP only=12/26/2015 0.54 MOM  [dd] 18 wks FS @ 01/09/16 [facility]-incomplete views of spine- f/u to complete survey-01/23/16 50 Row[facility]-COMPLETE NL FS   [xxx]GS @ 28-32 weeks-04/16/16 @ 3:30pm 50 [facility]  HGBEG- Neg  HSV 1&2- neg  Hep panels- Neg  Rubella - Imm  varicella- Imm  Parvavirus -unknown   Toxo - Neg  Adv otc ca++  blood type - A+  CF,SMA,Fragile X- [dd] Counsyl drawn 10/31/15-NEGATIVE  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 Ernest Haber  EPDS 3  Preferred Lab - N/A  [dd] Prog until 12 wks 11/28/15  [dd] Metformin until 12/12/15

## 2016-02-20 ENCOUNTER — Ambulatory Visit

## 2016-02-20 ENCOUNTER — Ambulatory Visit: Admitting: Obstetrics & Gynecology

## 2016-02-20 NOTE — Progress Notes (Signed)
Receipt of: OB Interval Visit: week 24 1/7    The following were sent to 'Wendall Mola' at [email or web address] on 02/27/2016 3:20:25 PM:     - Secure message created from ACM template     - Attachment created from ACM template
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
## 2016-03-11 ENCOUNTER — Ambulatory Visit: Admitting: Obstetrics & Gynecology

## 2016-03-11 ENCOUNTER — Ambulatory Visit

## 2016-03-11 LAB — HX  COMPLETE BLOOD COUNT
HX HEMATOCRIT: 36.5 % (ref 36.0–46.0)
HX HEMOGLOBIN: 12.4 g/dL (ref 12.0–16.0)
HX MEAN CORP.HEMO.CONC.: 34 g/dL (ref 31.0–37.0)
HX MEAN CORPUSCULAR HEMOGLOBIN: 30.5 pg (ref 26.0–34.0)
HX MEAN CORPUSCULAR VOLUME: 89.7 fL (ref 80.0–100.0)
HX MEAN PLATELET VOLUME: 10.9 fL (ref 9.4–12.4)
HX PLATELET COUNT: 199 10*3/uL (ref 150.0–400.0)
HX RED BLOOD COUNT: 4.1 M/uL (ref 4.0–5.2)
HX RED CELL DISTRIBUTION WIDTH SD: 45.5 fL (ref 35.0–51.0)
HX WHITE BLOOD COUNT: 12.3 10*3/uL — ABNORMAL HIGH (ref 4.5–11.0)

## 2016-03-11 LAB — HX GESTAT. DIABETES MELLITUS SCR: HX GESTAT. DIABETES MELLITUS SCR: 169 mg/dL — ABNORMAL HIGH (ref <140)

## 2016-03-11 NOTE — Progress Notes (Signed)
Receipt of: OB Interval Visit: week  27    The following were sent to Paige [name]' at [email or web address] on 03/12/2016 12:25:06 PM:     - Secure message created from ACM template     - Attachment created from ACM template
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
## 2016-03-12 ENCOUNTER — Ambulatory Visit: Admitting: Obstetrics & Gynecology

## 2016-03-12 LAB — HX RAPID PLASMA REAGIN: HX RAPID PLASMA REAGIN: NONREACTIVE

## 2016-03-12 NOTE — Telephone Encounter (Signed)
Internal Correspondence   Initial call taken by: Malachy Chamber MD,  March 12, 2016 12:25 PM  Type: Patient records  Summary of Call: LM to call me on my cell re: the need of 3HGLT      Follow-up #1  Details: Dr. Dorna Bloom spoke with the patient and she will go on 03/14/16 to [facility] @ 8am for 3 hour glt  Action Taken: Completed  By: Amil Amen ~ March 12, 2016 1:24 PM        Orders:  Added new Test order of GDMS3- GDMS 3 Hrs (GDMS3) - Signed

## 2016-03-14 ENCOUNTER — Ambulatory Visit

## 2016-03-14 ENCOUNTER — Ambulatory Visit: Admitting: Obstetrics & Gynecology

## 2016-03-14 LAB — HX FASTING GLUCOSE (GDMS): HX FASTING GLUCOSE (GDMS): 74 mg/dL (ref <95)

## 2016-03-14 LAB — HX 2 HR GLUCOSE: HX 2 HR GLUCOSE: 166 mg/dL — ABNORMAL HIGH (ref <155)

## 2016-03-14 LAB — HX 1 HR GLUCOSE: HX 1 HR GLUCOSE: 170 mg/dL (ref <180)

## 2016-03-14 LAB — HX 3 HR GLUCOSE: HX 3 HR GLUCOSE: 145 mg/dL — ABNORMAL HIGH (ref <140)

## 2016-03-14 NOTE — Progress Notes (Signed)
[facility]   [address]   [place], [place] [address]  Office: [phone] Fax: [phone]              March 14, 2016        Paige [name]  [address]  [place], [place]  [address]    Dear Ms. Mccleod:    You have been scheduled for an appointment in the setting of Gestational Diabetes on:     03/24/16 @ 2:30pm      This appointment is scheduled at the [facility] located on the first floor of  [facility].     Please bring your insurance card to this appointment.     If you must reschedule, please call [phone]        Sincerely,         Richarda Osmond. Dorna Bloom, MD

## 2016-03-14 NOTE — Progress Notes (Signed)
[facility]   [address]   [place], [place] [address]  Office: [phone] Fax: [phone]          [facility]  Date:   March 14, 2016                                                         [facility]    [place] MR#: ___________  Patient Information:   Name: Paige [name]       Phone:[phone]       Cell:  Address: [address]      [place], [place]        Zip:  [address]  D.O.B.: 1981-08-26  SS#: [id number]                                                                                                                                                                                                                                                 Insurance:  [employer] HMO                   Insurance ID:   [id number]                                                                                         Provider Information:   Name:    Dorna Bloom  Phone/Fax:  Address:                                           City:                                    Zip:  Building control surveyor (check all that apply):  XXXX U/S - Q 4 wk GS         XXXX OB Consultation - GDM                    _XXX_ Schedule Follow Up US if indicated     Preferred Site: __________ [facility]    Interpreter needed:                  no                 Language:  Medical Information:   LMP:09/04/2015     EDC:   06/10/16         Blood Type:   A+          Multiple Gestation:  OB History : G 3 P 1   SAB     TAB       Ectopic   Reason for Appointment/Comments:   Please fax these records: __ All ultrasounds(current pregnancy) ___ Current ABO   ___Down Syndrome Screen ( pending / declined) ___ CBC w/MCV( pending/declined)   ___ CF ( pending/declined)___ Sickle cell/ Hgb Electrophoresis (pending/ decline)   ___ Alfonzo Feller ( pending/ decline) * if any positives, include Fob's screening results     Roy internal use only:   Date of appnt :                    Time of  GC:                 Time of U/S:            Date scheduled :   OB office confirmation:         spoke to:                    Interpreter:                PSC initials:  Follow up:                          Initials:                           Comments:   Follow up:                          Initials:                           Comments:   Follow up:                          Initials:                           Comments:

## 2016-03-14 NOTE — Telephone Encounter (Signed)
Phone Note -     Other Incoming Call    ***URGENT***    Call back at [facility] [phone]  Initial call taken by: Halina Andreas,  March 14, 2016 12:48 PM  Actual Caller: Annette @ [facility]   Initial Details of Call:  Annette from [facility] called with urgent results. 2hr glucose results are 166.  fasting 74  1hr 170  3hr 145    Action Taken: Phone Call Completed

## 2016-03-14 NOTE — Telephone Encounter (Signed)
Phone Note -       Initial call taken by: Wendall Papa RN,  March 14, 2016 1:22 PM  Initial Details of Call:  Spoke to pt  She did not pass her 3hglt. She has GDM. She is scheduled for [facility] on 03/24/16 @ 2:30pm   She will have A1C test over the weekend   i will request mfm and Q4 wk sonos          Problems:  Added new problem of GESTATIONAL DIABETES (ICD-648.80) (ICD10-O24.419)  Orders:  Added new Test order of GLYCO - A1C** (GLYCO.) - Signed           Problems added or changed during this visit: GESTATIONAL DIABETES (ICD-648.80) (ICD10-O24.419)      Prenatal Visit Assessment and Plan: Spoke to pt  She did not pass her 3hglt. She has GDM. She is scheduled for [facility] on 03/24/16 @ 2:30pm   She will have A1C test over the weekend   i will request mfm and Q4 wk sonos       Global Obstetrical Plan: [name],P1[name]) [dd] Flu 05/2015  [dd] EDC 06/10/16 by LMP(boy)                               [xxx]Tdap   [dd] AMA/ERA-NIPT-Neg/ BOY  [dd]MSAFP only=12/26/2015 0.54 MOM  [dd] 18 wks FS @ 01/09/16 [facility]-incomplete views of spine- f/u to complete survey-01/23/16 50 Rowe-COMPLETE NL FS   [xxx]GS @ 28-32 weeks-04/16/16 @ 3:30pm 50 Rowe  HGBEG- Neg  HSV 1&2- neg  Hep panels- Neg  Rubella - Imm  varicella- Imm  Parvavirus -unknown   Toxo - Neg  Adv otc ca++  blood type - A+  CF,SMA,Fragile X- [dd] Counsyl drawn 10/31/15-NEGATIVE  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 Ernest Haber  EPDS 3  Preferred Lab - N/A  [dd] Prog until 12 wks 11/28/15  [dd] Metformin until 12/12/15  EPDS=0 03/11/16  PT for bilat carpal tunnel 03/19/16 @ 3:30pm ([address])  [xxx]GDM -                  [xxx] endocrin appt -03/24/16 @ 2:30pm [facility]          [xxx] nutritionist appt           [xxx] diabetic nurse appointment           [xxx] GS every 4 wks -           [xxx] MFM-   [xxx] A1C

## 2016-03-19 ENCOUNTER — Ambulatory Visit: Admitting: Obstetrics & Gynecology

## 2016-03-20 ENCOUNTER — Ambulatory Visit: Admitting: Obstetrics & Gynecology

## 2016-03-20 ENCOUNTER — Ambulatory Visit

## 2016-03-20 LAB — HX GLYCOHEMOGLOBIN
HX ESTIMATED AVERAGE GLUCOSE: 94 mg/dL
HX GLYCOHEMOGLOBIN EQUIVALENT: 0.437
HX HEMOGLOBIN A1C: 4.9 % (ref 4.2–5.8)

## 2016-03-20 NOTE — Progress Notes (Signed)
Referral Information:    Type:Physical Therapy Referral [PT]  Duration: 12 Month(s)  Number of Visits: 16  Instructions:pt    Service Provider Information:   [facility]  [address]  [place], [place]  [address]                  See Order Detail for completed referral information

## 2016-03-21 ENCOUNTER — Ambulatory Visit

## 2016-03-21 NOTE — Progress Notes (Signed)
[facility]   [address]   [place], [place] [address]  Office: [phone] Fax: [phone]          March 21, 2016  Paige [name]  [address]  [place], [place] [address]     Dear Ms. [name],  You have been scheduled for a growth scan utrasound and OB consultation in the setting of Gestational Diabetes  Appointment Date: 05/12/16 @ 8am    LOCATION:  [facility]                            [address]                             [place], [place] [address]    This is the medical office building next to [facility]- on the second floor.    This appointment runs on time. If you are unable to keep your appointment or are running late, you MUST call [phone] to reschedule.   If you reschedule this appointment, please notify our office to notify us of your new appointment date so that we may keep track of it.   Thank you for your attention. Please call if you have any questions or concerns at [phone].     Sincerely,     Richarda Osmond. Dorna Bloom, MD

## 2016-03-21 NOTE — Progress Notes (Signed)
Clinical Lists Changes    Observations:  Added new observation of OBCOMMENTS: [name],P1[name]) [dd] Flu 05/2015  [dd] EDC 06/10/16 by LMP(boy)                               [xxx]Tdap   [dd] AMA/ERA-NIPT-Neg/ BOY  [dd]MSAFP only=12/26/2015 0.54 MOM  [dd] 18 wks FS @ 01/09/16 [facility]-incomplete views of spine- f/u to complete survey-01/23/16 50 Rowe-COMPLETE NL FS   [xxx]GS @ 28 wks-04/09/16 @ 1pm 50 Rowe (changed by pt)  HGBEG- Neg  HSV 1&2- neg  Hep panels- Neg  Rubella - Imm  varicella- Imm  Parvavirus -unknown   Toxo - Neg  Adv otc ca++  blood type - A+  CF,SMA,Fragile X- [dd] Counsyl drawn 10/31/15-NEGATIVE  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 Ernest Haber  EPDS 3  Preferred Lab - N/A  [dd] Prog until 12 wks 11/28/15  [dd] Metformin until 12/12/15  EPDS=0 03/11/16  PT for bilat carpal tunnel 03/19/16 @ 3:30pm ([address])  [xxx]GDM -                  [xxx] endocrin appt -03/24/16 @ 2:30pm [facility]          [xxx] nutritionist appt           [xxx] diabetic nurse appointment -           [xxx] GS every 4 wks -                   [xxx] 04/09/16 @ 1pm 50 Rowe                   [xxx] 05/12/16 @ 8am 50 Rowe          [xxx] MFM-  04/09/16 @2pm  50 Rowe  [dd] A1C-wnl     (03/21/2016 16:28)               Global Obstetrical Plan: [name],P1[name]) [dd] Flu 05/2015  [dd] EDC 06/10/16 by LMP(boy)                               [xxx]Tdap   [dd] AMA/ERA-NIPT-Neg/ BOY  [dd]MSAFP only=12/26/2015 0.54 MOM  [dd] 18 wks FS @ 01/09/16 [facility]-incomplete views of spine- f/u to complete survey-01/23/16 50 Rowe-COMPLETE NL FS   [xxx]GS @ 28 wks-04/09/16 @ 1pm 50 Rowe (changed by pt)  HGBEG- Neg  HSV 1&2- neg  Hep panels- Neg  Rubella - Imm  varicella- Imm  Parvavirus -unknown   Toxo - Neg  Adv otc ca++  blood type - A+  CF,SMA,Fragile X- [dd] Counsyl drawn 10/31/15-NEGATIVE  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 Ernest Haber  EPDS 3  Preferred Lab - N/A  [dd] Prog until 12 wks 11/28/15  [dd] Metformin  until 12/12/15  EPDS=0 03/11/16  PT for bilat carpal tunnel 03/19/16 @ 3:30pm ([address])  [xxx]GDM -                  [xxx] endocrin appt -03/24/16 @ 2:30pm [facility]          [xxx] nutritionist appt           [xxx] diabetic nurse appointment -           [xxx] GS every 4 wks -                   [  xxx] 04/09/16 @ 1pm 50 Rowe                   [xxx] 05/12/16 @ 8am 50 Rowe          [xxx] MFM-  04/09/16 @2pm  50 Rowe  [dd] A1C-wnl

## 2016-03-24 ENCOUNTER — Ambulatory Visit

## 2016-03-24 ENCOUNTER — Ambulatory Visit: Admitting: Obstetrics & Gynecology

## 2016-03-24 NOTE — Progress Notes (Signed)
Clinical Lists Changes    Medications:  Added new medication of ONETOUCH ULTRA TEST  STRP (GLUCOSE BLOOD) check four times a day - Signed  Added new medication of ONETOUCH DELICA LANCETS 33G MISC (LANCETS) check four times a day - Signed  Rx of ONETOUCH ULTRA TEST  STRP (GLUCOSE BLOOD) check four times a day;  #200[Unspecified] x 3;  Signed;  Entered by: Rosina Lowenstein MD;  Authorized by: Rosina Lowenstein MD;  Method used: Electronically to [facility] [phone]*, [address], [place], [place]  [address], Ph: [phone] or [phone], Fax: [phone]  Rx of ONETOUCH DELICA LANCETS 33G MISC (LANCETS) check four times a day;  #200[Unspecified] x 3;  Signed;  Entered by: Jerene Canny LaVasseur RN;  Authorized by: Rosina Lowenstein MD;  Method used: Electronically to [facility] [phone]*, [address] [place], [place]  [address], Ph: [phone] or [phone], Fax: [phone]

## 2016-03-24 NOTE — Progress Notes (Signed)
Referral Information:    Type:Diabetes Educator [EDC]  Duration: 12 Month(s)  Number of Visits: 12  Instructions:    Service Provider Information:  Reinaldo Berber  [address]  [place], [place]  [address]                  See Order Detail for completed referral information

## 2016-03-24 NOTE — Progress Notes (Signed)
-DIABETES EDUCATOR TREATMENT PLAN-   Visit Date: 03/24/2016  Visit #: 1  Hrs: 1  Start Time: 230pm  Stop Time: 330pm  Interval (min): 60  Primary Care: Ellan Lambert MD  Referring MD: Dr Dorna Bloom MD     Reason for Visit: Initial Evaluation Visit Type: Gestational  New Diagnosis  Ethnicity: Caucasian  Interpreter: No  Previous Diabetes Education? No  Status of Education: Comprehension      Vital Signs:  Weight: 162 lb. (73.64 kg.)  (Weight change since last visit: -1 lbs.)  Height: 60.75 in. (154.31 cm.)  Finger Stick: 137  BMI: 30.97       Physical Activity:   Physical Activity? Yes  Activity Type: walking/ active job  Frequency: 5-6 times a week    Blood Sugars:   Patient Testing Blood Sugars? No  Glucose Meter Type: verio flex  Meter Given: Yes  Barriers to Blood Sugar Monitoring:   New skill      Lab Results:   A1C: 4.9 (03/20/2016 5:23:00 PM)    Risk Factors/Behaviors: Family History grandmother type 2    Psychosocial Barriers:  Barriers to Coping: None  Barriers of Risk Reduction: Lack of knowledge  Barriers Requiring One to One Educaton: None  Barriers to Taking Medications:   I instructed her in use of verio flex meter. She will check fasting and 1hr post meals     Dietary History & Food Patterns:   Uneven carbohydrate/fat/calorie distribution throughout day.          -DIABETES EDUCATION TEACHING PLAN -   Diabetes Overview:   03/24/2016  Define Diabetes Mellitus in simple terms:           State own type of diabetes:                                Diabetes Overview Comments: we discussed pathophysiology of gestaional diabetes     Psychosocial:   03/24/2016  Share perception/concern of diagnosis:           Identify family/SO role in managing diabetes:  Psychosocial Comments: This is her second child but first gestational pregnancy    Nutritional Management:   03/24/2016  Identify foods in CHO group:                          State right timing/portions for food & meds:  State effect of CHO's on blood  sugar:            Identify the CHO grams on food label:           Identify sugar/fat substitutes/labels:               Understand basics of CHO counting:              Nutritional Management Comments: We reviewed her meal plan, reading labels, carb/protein balance and portion control     Exercise and Activity:    03/24/2016  State exercise can lower blood sugar:                         Identify barriers to safe & regular exercise :                Do 1 activity for 10-20 min/day increase as tolerated:  Diabetes Exercise and Activity Comments: 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 She will check fasting and 1hr post meals     Acute Complications:    03/24/2016  State causes & s/s of hyperglycemia:                       State treatment & prevention of hyperglycemia:      State causes & s/s of hypoglycemia:                         State treatment & prevention of hypoglycemia:       Diabetes Acute Complications Comments: She does experience hypoglycemia frequently so we discussed prevention and treatment       Lifestyle Changes:    03/24/2016  Acknowledges need to change lifestyle habits/behaviors:    Gestational Management (Preconception Care or Pregnancy Care):    03/24/2016  State link btwn blood sugar & pregnancy outcome:      Understand risk of maternal & fetal comp r/t diabetes:                -Behavior Goals-   Current Goal(s): I will carry some form of carbohydrate on my person at all times.  Understanding of diabetes: Fair   History of hospital for low blood sugars: No  History of hospital for high blood sugars:  No  History of hospital for DKA: No  Medical Alert Bracelet:  No  Updated Glucagon Kit: No    -Learning Style-    Learning preferences/Health literacy: needs written instructions  Stage of change: preparation  Stage of grieving: bargaining    -Session Focus-   Overview of diabetes: Gestational, Instruction in self monitoring and how it relates to: Diet and exercise, Identify and treatment of low blood sugars, Basic carbohydrate counting/label reading, 1800 ADA meal plan with carbohydrate restriction, Problem solving and strategizing to change lifestyle behaviors and habits, Importance of consistent exercise to lower blood sugars and weight, Proper meter testing technique    -Diabetes Self Management Plan of Care-   Diabetes Ongoing Support Management: follow up with educator in 1 month and or endocrinologist. She will send weekly readings thru the portal   Diet: Spread out your meals to 4-6 smaller meals throughout the day., Limit carbohydrate intake to 30-45 gms at each meal., Have a small snack in between meals; less than 15-30 carbohydrate per snack and with a protein., Please review 25 snack ideas sheet (given)., Focus is carbohydrate restriction. Focus on decreasing "carbohydrates" and "white flour" products., Limit portions of bread- pasta- rice- potatoes- cereals and "junk foods".Whole grain products are better- but still watch portions of these!, IF you drink soda or juices; stop these and change to diet soda., Check the food label for serving size and total carbohydrates (gms)., Do not  look at sugar label!  Exercise: Already have exercise plan.  Glucose Meter Type: verio flex  Monitor: Blood sugars: fasting and 1 hour after meals.   Referral: Your PCP for follow up appointment.  Summary of Educational Visit: Paige [name] is here for diabetes education. She is newly diagnosed with gestational diabetes . This is her second child but first gestational pregnancy. She is 29 weeks. We reviewed her meal plan and she  actually eats very healthy and took nutrition in college. She works as a Medical sales representative . I instructed her in use of verio flex meter and she will check four times a day. She has experienced hypoglycemia fairly frequently so we discussed prevention and treatment. She will send her readings weekly thru the portal and will f/u with CDE and or Dr Tedra Senegal in 1 month. She does not feel she needs to see a dietician  at this point .

## 2016-03-25 ENCOUNTER — Ambulatory Visit

## 2016-03-25 NOTE — Telephone Encounter (Signed)
Internal Correspondence   **Informational purposes only**    Initial call taken by: Arletha Pili Virden,  March 25, 2016 8:54 AM  Summary of Call: Patient saw Rena yesterday. Have not seen Dr. Tedra Senegal yet and Artelia Laroche wants patient to be scheduled for f/u with provider in a month.     L/M for patient to call back to make this appt to see Dr. Tedra Senegal. Artelia Laroche states that patient would like to be seen on either a Wednesday or Thursday.       Follow-up #1  Details: Patient scheduled to see Dr. Tedra Senegal on Aug 16 @ 2:30PM  Action Taken: Completed  By: Arletha Pili Greer ~ March 25, 2016 11:17 AM

## 2016-03-26 ENCOUNTER — Ambulatory Visit

## 2016-03-26 ENCOUNTER — Ambulatory Visit: Admitting: Obstetrics & Gynecology
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
## 2016-03-26 NOTE — Progress Notes (Signed)
Clinical Lists Changes    Medications:  Changed medication from [facility] ULTRA TEST  STRP (GLUCOSE BLOOD) check four times a day to [facility] VERIO STRP (GLUCOSE BLOOD) check four times a day - Signed  Added new medication of ONETOUCH VERIO STRP (GLUCOSE BLOOD) check four times a day - Signed  Rx of ONETOUCH VERIO STRP (GLUCOSE BLOOD) check four times a day;  #200[Strip] x 3;  Signed;  Entered by: Jerene Canny LaVasseur RN;  Authorized by: Rosina Lowenstein MD;  Method used: [id number] [address], [place], [place]  [address], Ph: [phone] or [phone], Fax: [phone]  Rx of ONETOUCH VERIO STRP (GLUCOSE BLOOD) check four times a day;  #200[Strip] x 3;  Signed;  Entered by: Jerene Canny LaVasseur RN;  Authorized by: Rosina Lowenstein MD;  Method used: [facility] [phone]*, [address] [place], [place]  [address], Ph: [phone] or [phone], Fax: [phone]

## 2016-03-26 NOTE — Telephone Encounter (Signed)
Phone Note -       Call back at Wk [phone]  Initial call taken by: Letha [place],  March 26, 2016 10:19 AM  Initial Details of Call:  You gave pt one touch verio meter on 03/24/2016 an rx was called in for one touch ultra strips and lancets. Pt picked up the test strips and lancets. wont fith the meter      Follow-up #1  Details: I called pt back and it was her test strips that were incorrect. She has opened the pack so they will not take it back and it cost 50 dollars. I will give her a one touch mini meter to use her current strips with and then will do scripts for the verio strips for the next refill   By: Rena C. LaVasseur RN ~ March 26, 2016 12:42 PM

## 2016-04-02 ENCOUNTER — Ambulatory Visit

## 2016-04-09 ENCOUNTER — Ambulatory Visit

## 2016-04-09 ENCOUNTER — Ambulatory Visit: Admitting: Obstetrics & Gynecology

## 2016-04-09 NOTE — Progress Notes (Signed)
Receipt of: OB Interval Visit: week 31 1/7    The following were sent to Wendall Mola' at [facility] .[email or web address] on 04/11/2016 5:10:05 AM:     - Secure message created from [facility] template     - Attachment created from [facility] template
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
## 2016-04-11 ENCOUNTER — Ambulatory Visit: Admitting: Obstetrics & Gynecology

## 2016-04-16 ENCOUNTER — Ambulatory Visit

## 2016-04-16 ENCOUNTER — Ambulatory Visit: Admitting: Obstetrics & Gynecology

## 2016-04-17 ENCOUNTER — Ambulatory Visit

## 2016-04-23 ENCOUNTER — Ambulatory Visit

## 2016-04-23 ENCOUNTER — Ambulatory Visit: Admitting: Obstetrics & Gynecology
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
## 2016-04-23 NOTE — Progress Notes (Signed)
Receipt of: OB Interval Visit: week 33 1/7    The following were sent to Paige [name]' at [facility] .com on 04/26/2016 1:36:50 PM:     - Secure message created from ACM template     - Attachment created from ACM template

## 2016-04-24 ENCOUNTER — Ambulatory Visit

## 2016-04-26 ENCOUNTER — Ambulatory Visit

## 2016-04-26 ENCOUNTER — Ambulatory Visit: Admitting: Obstetrics & Gynecology
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
## 2016-04-26 NOTE — Progress Notes (Signed)
[facility]   [address]   [place], [place] [address]  Office: [phone] Fax: [phone]    Name   Paige [name]             Patient #   [id number]    Visit Date Weeks Gest Wt BP UPro UGlu UNit HA N/V Edema VagBld VagD/C   04/23/2016 33 1/7 161 118 / 68 neg neg neg No No 0 no no   04/09/2016 31 1/7 162 110 / 54 trace neg neg No No 0 no no   03/26/2016 29 1/7 161 110 / 60 neg neg neg No No 0 no no   03/11/2016 27 0/7 163 110 / 60 neg neg neg No No 0 no no   02/20/2016 24 1/7 160 110 / 60 neg neg neg No No 0 no no   01/23/2016 20 1/7 153 100 / 60 neg neg neg No No 0 no no   12/26/2015 16 1/7 150 110 / 60 neg neg neg + No 0 no no   11/28/2015 12 1/7 144 110 / 60 neg neg neg + No 0 no no   10/31/2015 8 1/7 144 116 / 58 NEGATIVE NEGATIVE NEGATIVE + nausea 0 no no                                                                                                                                                               Visit Date Weeks  Gest Fund  Ht FHR FetAct LbrSxs Position CxDil CxEff CxSta Vit? SmokHx RTC Res Prec   04/23/2016 33 1/7 33 146 yes no     Y n/a 2 wk     04/09/2016 31 1/7 31 146 yes no  0 0% high Y n/a 2 wk     03/26/2016 29 1/7 29 156 yes no     Y n/a 2 wk     03/11/2016 27 0/7 27 156 yes no     Y n/a 2 wk     02/20/2016 24 1/7 24 146 yes no     Y n/a 3 wk     01/23/2016 20 1/7 20 146 yes no     Y n/a 4 wk     12/26/2015 16 1/7  156 N/A no     Y n/a 4 wk     11/28/2015 12 1/7  + N/A no     Y n/a 4 wk     10/31/2015 8 1/7   N/A no  0 0% high Y n/a 4 wk  Name   Paige [name]             Patient #   [id number]  Hospital # [id number]     Visit Date [name] Comments   04/23/2016 33 1/7 has NST for GDM  next [place]  [xxx] 05/12/16 @ 8am 50 Rowe   04/09/2016 31 1/7 [xxx]GS @ 28 wks-04/09/16 @ 1pm 50 Rowe (changed  by pt)  NST for GDM (diet control) 04/16/16 @ 10am         03/26/2016 29 1/7 dx: GDM  xxx]GS @ 28 wks-04/09/16 @ 1pm 50 Rowe (changed by pt)       03/11/2016 27 0/7 1 hr GLT/CBC/RPR done today, s/s PET/PTL/FMC reviewed    [facility] GTT handout given      [xxx]GS @ 28-32 weeks-04/16/16 @ 3:30pm 50 Rowe'    her CTS back and she  had this with her last pregnancy and she used otc device and this time is not working and will refer to PT  her back is better with mat belt   02/20/2016 24 1/7 glt in 3 weks  discussed mat belt while woking   01/23/2016 20 1/7 reviewed 18wks FS & genetic screen result. adv PNC & sleep on her side and discussed diet, exercise, activity, sex and finding a pediatrician.      [dd]MSAFP only=12/26/2015 0.54 MOM    [xxx] 18 wks FS @ 01/09/16 [facility]-incomplete views of spine- f/u to complete survey-01/23/16 @ 4:15pm 50 Rowe   [xxx]GS @ 28-32 weeks-04/16/16 @ 3:30pm 50 Rowe   12/26/2015 16 1/7 [xxx]MSAFP only @ 16 weeks   [xxx] 18 wks FS @ 01/09/16 at 10:45 am [facility]   11/28/2015 12 1/7    10/31/2015 8 1/7                                                           [id number]    [name]    Impression Plan         Copyright 2005, [employer]. All rights reserved.

## 2016-04-26 NOTE — Progress Notes (Signed)
[facility]   [address]   [place], [place] [address]  Office: [phone] Fax: [phone]        Prenatal Record  PATIENT'S RH: POSITIVE  Physician:  Ellan Lambert MD [name]:  06/10/2016 Date printed: 04/26/2016 (33.57 wks )   [id number]                                                 ********Issues/Risks with this Pregnancy for Labor and Delivery********    Dr. Ernest Haber in [place]     Demographics  Name    Paige [name] DOB    Feb 28, 1981 Age    35 Years Old Race    White Status    Married   Address  [address] [name] Phone  [phone] Other Phone     [place]  [place]    [address] SS#  [id number]   Occupation    outside work Work Phone  [phone] Hospital Patient #    [id number]     Insurance    [place] [employer] [facility] Policy #    [id number] Guarantor    Wendall Mola D   FOB    Jaquay Morneault Involved   Age Race      Occupation  Chef   FOB Phone [phone]    Support Person     Home Phone Relationship   Infant's Physician    Dr. Abe People [place] [facility]  [facility] Feeding plans           Previous Pregnancies G 3 P 1 Term 1 Preterm  LC 1    Ab 1 SAB 1 EAB  Ectopic  Mult Preg    No Date Wks  Gest Delivery  Type Hrs  Labor Anes Location Infant Comments          Sex Weight    1 2013  SAB                2 08/29/2013 40 1/2  NSVD         [place] M 7-1 IUI, IOL for AFP  she had prog supp  Name   Paige [name]             Patient #   [id number]    Physical Exam  Gen appearance: Alert, no acute distress, well hydrated, well developed, well nourished, appropriate dress,non-ill appearing.   Affect/Mood:    Head:  Ears:TM's intact and clear with  normal canals with grossly normal hearing.    Eyes:TM's intact and clear with normal canals with grossly normal hearing.    Nose: L turbinate boggy/ erythematou, tenderness ethmoid sinus   Oral: no deformity or lesions with good dentition.    Skin:    Neck: supple, no adenopathy, no masses, thyroid normal size, no thyroid tenderness or nodules,.    Heart:RRR, no murmurs, no gallops.    Lungs: No respiratory distress, no accessory muscle use, clear to auscultation.    Breasts:   no asymmetry, no skin changes, no nipple discharge,no masses,no tenderness.    Abdomen: nondistended, nontender, no guarding, normal BS, no hepatosplenomegaly, no hernias, no CVA tenderness bilaterally.    Extremities:   Back:   Neuro:      Vulva: Normal appearance, normal hair distribution, no lesions or masses.    Vagina: Normal, rugated, physiologic discharge, no lesions, no masses, adequate pelvic support.    Cervix: Normal, no motion tenderness, no lesions.    Adnexa: Normal, no masses, mobile, nontender.    Rectal: EXTERNAL HEMORRHOIDS.    Uterus:smooth, mobile, non-tender, firm, adequate support, no prolapse, 6-8 WKS SIZE.    Urethra: Normal, no masses, non-tender, no discharge.         Additional Information      EDC / Dating Information  Current best EDC: 06/10/2016   Cycle Interval: 30 days    LMP: 09/04/2015 =EDC 06/10/2016      Reliability: Yes    Pill period? no   Preg Test Date      Initial Exam 10/31/2015 =EDC    Date Steth Tones   =EDC     Date Quickening   =EDC     U/S     Date GA =EDC   10/22/2015  06/13/2016                    Allergies  * NO KNOWN DRUG  ALLERGIES (Mild)     Paige [name]    [id number]  Medications  TRIAMCINOLONE ACETONIDE 0.1 % CREA (TRIAMCINOLONE ACETONIDE) apply two times daily as needed to  rash - apply sparingly  LORATADINE 10 MG TABS (LORATADINE) take one by mouth daily as needed for allergies  METFORMIN HCL 500 MG TABS (METFORMIN HCL) take one by mouth twice daily for PCO  FISH OIL 1000 MG CAPS  (OMEGA-3 FATTY ACIDS) 1 by mouth once daily  CVS PRENATAL MULTI+DHA CAPS (PRENATAL MV-MIN-FE FUM-FA-DHA CAPS)   CALCIUM CARBONATE-VITAMIN D 600-400 MG-UNIT TABS (CALCIUM CARBONATE-VITAMIN D) Take one tablet by mouth daily  ONETOUCH VERIO STRP (GLUCOSE BLOOD) check four times a day  ONETOUCH DELICA LANCETS 33G MISC (LANCETS) check four times a day  ONETOUCH VERIO STRP (GLUCOSE BLOOD) check four times a day                                                        ********Risks with this Pregnancy for Labor and Delivery********    Dr. 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)  SUPERVISION OF OTHER HIGH RISK PREGNANCIES, THIRD TRIMESTER (ICD-V23.89) (ICD10-O09.893)      GESTATIONAL DIABETES (ICD-648.80) (ICD10-O24.419)    Paige [name],Paige [name]  [dd] EDC 06/10/16 by LMP(boy)   DM                [dd]Tdap gvn 03/26/16  [dd] AMA/ERA-NIPT-Neg/ BOY  [dd]MSAFP only=12/26/2015 0.54 MOM  [dd]18 wks FS @ 01/09/16 [facility]-incomplete views of spine- f/u           to complete survey-01/23/16 50 Rowe-COMPLETE NL FS   [dd] 7/26/17GS-3-13  [xxx] 05/12/16 @ 8am 50 Rowe  HGBEG- Neg  HSV 1&2- neg  Hep panels- Neg  Rubella - Imm  varicella- Imm  Parvavirus -unknown   Toxo - Neg  Adv otc ca++  blood type - A+  CF,SMA,Fragile X- [dd] Counsyl drawn 10/31/15-NEGATIVE  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 Ernest Haber  EPDS 3  Preferred Lab - N/A  [dd] Prog until 12 wks 11/28/15  [dd] Metformin until 12/12/15  EPDS=0 03/11/16  PT for bilat carpal tunnel 03/19/16 @ 3:30pm ([address])  [dd]GDM - [dd] endocrin appt -03/24/16 @  2:30pm Paige [name]          [dd] nutritionist appt  [dd] diabetic nurse appointment -          [dd] MFM-  04/09/16 @2pm  50 Rowe  [dd] A1C-wnl  NST for GDM (diet control) 04/16/16 @ 10am       Maternal age = 35 Years Old  Prior Operation(s): 2013 D&C ; she has an arcuate uterus and hystersocpy  Hx of Varicella: Hx of Disease: Immune  Hx of STD's  Smoking: never smoked  Hx Cystic fibrosis: Negative Carrier Status         Paige [name]    [name]  Test Date Result   Hemoglobin 03/11/2016 12.4   Hematocrit 03/11/2016 36.5   Blood Type  Rh Antibody Scr  Rh Type 10/31/2015     A POSITIVE       TSH  TSH w/ Reflex   06/20/2015   1.47   Rubella   10/31/2015   POSITIVE     Serology (RPR) 03/11/2016 NON-REACTIVE   Platelets 03/11/2016 199 K/UL   Chlamydia Culture  Chlamydia Ab         GC Culture  GC Ab   10/31/2015   NEGATIVE (RNA-PROBE)   Urine Culture 10/31/2015 <10,000 col/ml SKIN FLORA ISOLATED   PAP 10/31/2015 SOUT   HIV     HPV 11/01/2014 Negative   MSAFP     SEQUENTIAL#1     SEQUENTIAL#2     MSAFP MOM     GTT 1H 50G 03/11/2016 169   GTT FBS 03/14/2016 74    GTT  1 hr 03/14/2016 170     GTT 2 hr 03/14/2016 166     GTT 3 hr 03/14/2016 145     Test Date Result   Dating U/S 10/22/2015 8:00am @ [facility]   Survey U/S     Toxoplasmosis IGG        Toxoplasmosis IGM 10/31/2015        10/31/2015  NEGATIVE         NEGATIVE   Paige [name] 11/28/2015 Absent   Amnio     PPD     RhoGam     KB (Fetaldex)     Group B Strep cult         Downs Synd Odds     Trisomy 18 Odds     Hep B 10/31/2015 Non-reactive   Hep C 10/31/2015 Non-reactive   Varicella    Parvovirus IGM  Parvovirus IGG   10/31/2015         POSITIVE         Cystic Fibrosis     Last TDAP:   done07/08/2016    Last FLU : Flu IM10/02/2015      [name]  09-30-1980                                                   ********Issues/Risks with this Pregnancy for Labor and Delivery********    Dr. Ernest Haber in [place]

## 2016-04-28 ENCOUNTER — Ambulatory Visit

## 2016-04-29 ENCOUNTER — Ambulatory Visit

## 2016-04-29 NOTE — Telephone Encounter (Signed)
Phone Note -       Initial call taken by: Rosemarie Ax,  April 29, 2016 9:06 AM  Initial Details of Call:  Referral requested from central referral department for endocrine apt.

## 2016-04-29 NOTE — Progress Notes (Signed)
Referral Information:    Type:Endocrinology Referral [ENDO]  Duration: 12 Month(s)  Number of Visits: 6  Instructions:Referral Request  Has an appointment with Rosina Lowenstein, MD  NPI [id number]  Reason:gst diabetes  Appointment Date:04/30/16    Service Provider Information:  Rosina Lowenstein, MD  [facility]  [address]  [place], [place]  [address]                  See Order Detail for completed referral information

## 2016-04-30 ENCOUNTER — Ambulatory Visit: Admitting: Internal Medicine

## 2016-04-30 ENCOUNTER — Ambulatory Visit

## 2016-04-30 ENCOUNTER — Ambulatory Visit: Admitting: Obstetrics & Gynecology

## 2016-04-30 NOTE — Progress Notes (Signed)
Receipt of: GDM-diet, PCOS    The following were sent to Paige [name]' at [email or web address] on 04/30/2016 2:19:37 PM:     - Secure message created from ACM template     - Attachment created from ACM template

## 2016-04-30 NOTE — Progress Notes (Signed)
ENDOCRINOLOGY ENCOUNTER    PCP: Ellan Lambert MD  HPI: I am seeing this patient for initial consultation at the request of Dr Dorna Bloom for GDM    35 y/o F G3P1 with PMH of PCOS,HL here for initial Cx of GDM    2.37 y/o boy-7lb 1 ounce, was born in [place], no GDM then but was on metformin until 14 weeks and was on metformin even in this pregnancy until 14 weeks and was stopped by Dr Dorna Bloom- as in the past she did well without metformin.    OGTT 02-2016 74/170/166/145    has seen CDE,testing 4x/d and msot BG are at target,occ 140/170    PCOS Diagnosed 5 years ago based on oligomenorrhea, no acne/hirsuitism-clomiphene/IUI second preg first pregnancy she lost the bay at 9 weeks,this preg-no clomiphene but was on metformin    GM:DM-2    Zoo [employer]. No smoking/ETOH,married    Allergies:    * NO KNOWN DRUG  ALLERGIES.    Medication List:   TRIAMCINOLONE ACETONIDE 0.1 % CREA (TRIAMCINOLONE ACETONIDE) apply two times daily as needed to  rash - apply sparingly  LORATADINE 10 MG TABS (LORATADINE) take one by mouth daily as needed for allergies  METFORMIN HCL 500 MG TABS (METFORMIN HCL) take one by mouth twice daily for PCO  FISH OIL 1000 MG CAPS (OMEGA-3 FATTY ACIDS) 1 by mouth once daily  CVS PRENATAL MULTI+DHA CAPS (PRENATAL MV-MIN-FE FUM-FA-DHA CAPS)   CALCIUM CARBONATE-VITAMIN D 600-400 MG-UNIT TABS (CALCIUM CARBONATE-VITAMIN D) Take one tablet by mouth daily  ONETOUCH VERIO STRP (GLUCOSE BLOOD) check four times a day  ONETOUCH DELICA LANCETS 33G MISC (LANCETS) check four times a day  ONETOUCH VERIO STRP (GLUCOSE BLOOD) check four times a day    Problem List:   SUPERVISION OF OTHER HIGH RISK PREGNANCIES, THIRD TRIMESTER (ICD-V23.89) (ICD10-O09.893)  GESTATIONAL DIABETES (ICD-648.80) (ICD10-O24.419)  CERVICAL LYMPHADENOPATHY (ICD-785.6) (ICD10-R59.0)  HYPERLIPIDEMIA (ICD-272.4) (ICD10-E78.5)  PREVENTIVE HEALTH CARE (ICD-V70.0) (ICD10-Z00.00)  ECZEMA (ICD-692.9) (ICD10-L30.9)  POLYCYSTIC OVARIAN SYNDROME; ON  METFORMIN PER 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)  ROUTINE GYNECOLOGICAL EXAMINATION (ICD-V72.31) (ICD10-Z01.419)      Histories:   Past Family History: Mother -41s;  atrial fibrillation - cardioverted - high cholesterol  Father - 21s HL and kidney stone  one brother, high cholesterol   2 sisters   Past Medical History: adv mvt and ca++  Past Surgical History: 2013 D&C ; she has an arcuate uterus and hystersocpy  Social History: married, one son [name] - works full time at [employer] - [place]oke[place]  Scientist, clinical (histocompatibility and immunogenetics) degree    Review of Systems   General: General: Denies fever/chills  Eyes: Denies visual change or blurring.   Ears/Nose/Throat: Denies difficulty swallowing.   Cardiovascular: Denies chest pain or pressure.   Respiratory: Denies shortness of breath.   Gastrointestinal: Denies change in bowel habits.   Musculoskeletal: Denies back pain.   Skin: Denies skin ulcers.   Neurologic: Denies parasthesias.   Psychiatric: Denies depression.   Endocrine: Denies hypoglycemia  All other pertinent systems reviewed and are negative      Vital Signs   Weight: 161 lb. (73.18 kg.)  (Weight change since last visit: 0 lbs.)  Finger Stick: 114    Height: 60.75 in. (154.31 cm.)  BMI: 30.78    Blood Pressure: 115 /81 mm Hg.  Postion: sitting  Pulse Rate: 98  Comment: right arm    [place].....................Marland KitchenAugust 16, 2017 12:54 PM  Physical Exam:   General: Gen:NAD ,   Skin: no rash,   Eyes: PERLA,EOMI, no lid lag/retraction/corneal injection   Neck: no LNP   Thyroid:Thyroid normal , no nodules, No LNP, no bruit   Chest: Symmetrical,    CV: S1 S2 normal, RRR. No M/g   Resp:CTA B/L   GI/Abd: BS+, No organomegaly   Foot: DP+,No LE edema    MSK: No joint enlargement  Neuro: AAOx3, No tremors   Psych: mood stable          Laboratory Results:   TSH Ultra: 1.47 (06/20/2015 9:11:00 AM)  HgbA1C: 4.9 (03/20/2016 5:23:00 PM)  Glucose: 72 (10/31/2015 4:00:00 PM)  Fasting Blood sugar: 74 (03/14/2016 8:15:00  AM)  Last fingerstick: 137 (03/24/2016 3:49:23 PM)  Sodium: 142 (02/15/2015 9:08:00 AM)  Potassium: 3.9 (02/15/2015 9:08:00 AM)  Chloride: 103 (02/15/2015 9:08:00 AM)  Bicarbonate 24 (02/15/2015 9:08:00 AM)  BUN: 12 (02/15/2015 9:08:00 AM)  Creatinine: 0.8 (02/15/2015 9:08:00 AM)  Serum Calcium: 9.4 (02/15/2015 9:08:00 AM)  Cholesterol: 264 (06/20/2015 9:11:00 AM)  LDL: 195 (06/20/2015 9:11:00 AM)  HDL: 43 (06/20/2015 9:11:00 AM)  CRP: 18.5 mg/L (06/20/2015 9:11:00 AM)  Triglyceride: 129 (06/20/2015 9:11:00 AM)  Problems:   SUPERVISION OF OTHER HIGH RISK PREGNANCIES, THIRD TRIMESTER (ICD-V23.89) (ICD10-O09.893)  GESTATIONAL DIABETES (ICD-648.80) (ICD10-O24.419)  CERVICAL LYMPHADENOPATHY (ICD-785.6) (ICD10-R59.0)  HYPERLIPIDEMIA (ICD-272.4) (ICD10-E78.5)  PREVENTIVE HEALTH CARE (ICD-V70.0) (ICD10-Z00.00)  ECZEMA (ICD-692.9) (ICD10-L30.9)  POLYCYSTIC OVARIAN SYNDROME; 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)  ROUTINE GYNECOLOGICAL EXAMINATION (ICD-V72.31) (ICD10-Z01.419)    Medications:   TRIAMCINOLONE ACETONIDE 0.1 % CREA (TRIAMCINOLONE ACETONIDE) apply two times daily as needed to  rash - apply sparingly  LORATADINE 10 MG TABS (LORATADINE) take one by mouth daily as needed for allergies  METFORMIN HCL 500 MG TABS (METFORMIN HCL) take one by mouth twice daily for PCO  FISH OIL 1000 MG CAPS (OMEGA-3 FATTY ACIDS) 1 by mouth once daily  CVS PRENATAL MULTI+DHA CAPS (PRENATAL MV-MIN-FE FUM-FA-DHA CAPS)   CALCIUM CARBONATE-VITAMIN D 600-400 MG-UNIT TABS (CALCIUM CARBONATE-VITAMIN D) Take one tablet by mouth daily  ONETOUCH VERIO STRP (GLUCOSE BLOOD) check four times a day  ONETOUCH DELICA LANCETS 33G MISC (LANCETS) check four times a day  ONETOUCH VERIO STRP (GLUCOSE BLOOD) check four times a day    Orders:   Added new Service order of Patient Encounter ([id number]) - Signed  Added new Service order of Glucose TC (ECM^GLU) - Signed  Added new Service order of Est Visit Level 2 Tech Chg (ECM^EV2) - Signed  Added new  Service order of Consult Visit 60 Min [phone]) - Signed  Added new Test order of GLYCO - A1C** (GLYCO.) - Signed  Added new Test order of G- Fasting Glucose (G) - Signed  Added new Test order of GTT2-2hr Glucose Tolerance (GTT2) - Signed  Added new Test order of TESTO - Testosterone (TESTO) - Signed  Assessment and Plan:     35 y/o F G3P1 with PMH of PCOS,HL here for initial Cx of GDM    Gestational diabetes: A1c is 4.9%, .I advised she start taking short walks after each meal, unless there is a contraindication with her pregnancy.  No juice or soda.    Patient was counseled more than 35 minutes regarding gestational diabetes.  If untreated/uncontrolled - can have risks like increased operative delivery rates, lifetime increased risk of glucose intolerance, risks to the offsprings like neonatal hypoglycemia, fetal macrosomia and increased risk of ICU admissions. Counseled about upto 50 % lifetime risk of type  2 diabetes and recurrence in subsequent pregnancies and importance of follow up testing post parturm    PCO S: Currently off of metformin as it was stopped by her OB/GYN after 14 weeks.  I will see her 4 months postpartum and we will check the levels were testosterone.  Metformin will be reinitiated after she stops breast-feeding.    RTC 35m post parturm w/OGTT    Thank you for involving me in the care of your patient.    Kind regards,    Rosina Lowenstein, MD    Copy of note sent to referring provider  Note generated using dictation software and errors may occur                          Instructions: Return for follow-up Jan w/labs/OGTT.

## 2016-05-07 ENCOUNTER — Ambulatory Visit: Admitting: Obstetrics & Gynecology

## 2016-05-07 ENCOUNTER — Ambulatory Visit

## 2016-05-08 ENCOUNTER — Ambulatory Visit

## 2016-05-08 ENCOUNTER — Ambulatory Visit: Admitting: Obstetrics & Gynecology
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
## 2016-05-08 NOTE — Progress Notes (Signed)
Clinical Lists Changes    Observations:  Added new observation of OBCOMMENTS: [name],P1[name]) [dd] Flu 05/2015  [dd] EDC 06/10/16 by LMP(boy)   DM                [dd]Tdap gvn 03/26/16  [dd] AMA/ERA-NIPT-Neg/ BOY  [dd]MSAFP only=12/26/2015 0.54 MOM  [dd]18 wks FS @ 01/09/16 [facility]-incomplete views of spine- f/u           to complete survey-01/23/16 [address]-COMPLETE NL FS   [dd] 7/26/17GS-3-13  [xxx] GS 05/12/16 @ 8am [address]  HGBEG- Neg  HSV 1&2- neg  Hep panels- Neg  Rubella - Imm  varicella- Imm  Parvavirus -unknown   Toxo - Neg  Adv otc ca++  blood type - A+  CF,SMA,Fragile X- [dd] Counsyl drawn 10/31/15-NEGATIVE  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 Ernest Haber  EPDS 3  Preferred Lab - N/A  [dd] Prog until 12 wks 11/28/15  [dd] Metformin until 12/12/15  EPDS=0 03/11/16  PT for bilat carpal tunnel 03/19/16 @ 3:30pm ([address])  [dd]GDM - [dd] endocrin appt -03/24/16 @ 2:30pm [facility]          [dd] nutritionist appt  [dd] diabetic nurse appointment -          [dd] MFM-  04/09/16 @2pm  [address]  [dd] A1C-wnl  NST for GDM (diet control) 04/16/16 @ 10am     (05/08/2016 8:41)               Global Obstetrical Plan: [name],P1[name]) [dd] Flu 05/2015  [dd] EDC 06/10/16 by LMP(boy)   DM                [dd]Tdap gvn 03/26/16  [dd] AMA/ERA-NIPT-Neg/ BOY  [dd]MSAFP only=12/26/2015 0.54 MOM  [dd]18 wks FS @ 01/09/16 [facility]-incomplete views of spine- f/u           to complete survey-01/23/16 [address]-COMPLETE NL FS   [dd] 7/26/17GS-3-13  [xxx] GS 05/12/16 @ 8am [address]  HGBEG- Neg  HSV 1&2- neg  Hep panels- Neg  Rubella - Imm  varicella- Imm  Parvavirus -unknown   Toxo - Neg  Adv otc ca++  blood type - A+  CF,SMA,Fragile X- [dd] Counsyl drawn 10/31/15-NEGATIVE  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 Ernest Haber  EPDS 3  Preferred Lab - N/A  [dd] Prog until 12 wks 11/28/15  [dd] Metformin until 12/12/15  EPDS=0 03/11/16  PT for bilat carpal tunnel 03/19/16 @ 3:30pm ([address])  [dd]GDM -  [dd] endocrin appt -03/24/16 @ 2:30pm [facility]          [dd] nutritionist appt  [dd] diabetic nurse appointment -          [dd] MFM-  04/09/16 @2pm  [address]  [dd] A1C-wnl  NST for GDM (diet control) 04/16/16 @ 10am

## 2016-05-12 ENCOUNTER — Ambulatory Visit

## 2016-05-12 ENCOUNTER — Ambulatory Visit: Admitting: Obstetrics & Gynecology

## 2016-05-12 NOTE — Progress Notes (Signed)
Clinical Lists Changes    Observations:  Added new observation of OBCOMMENTS: [name],P1,08/29/13 NVSD Boy) [dd] Flu 05/2015  [dd] EDC 06/10/16 by LMP(boy)   DM                [dd]Tdap gvn 03/26/16  [dd] AMA/ERA-NIPT-Neg/ BOY  [dd]MSAFP only=12/26/2015 0.54 MOM  [dd]18 wks FS @ 01/09/16 [facility]-incomplete views of spine- f/u           to complete survey-01/23/16 [address]-COMPLETE NL FS   [dd] 7/26/17GS-3-13  [dd] GS 05/12/16 @ [address]- VTX, 2,573g, 5-11, 31%  HGBEG- Neg  HSV 1&2- neg  Hep panels- Neg  Rubella - Imm  varicella- Imm  Parvavirus -unknown   Toxo - Neg  Adv otc ca++  blood type - A+  CF,SMA,Fragile X- [dd] Counsyl drawn 10/31/15-NEGATIVE  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 Ernest Haber  EPDS 3  Preferred Lab - N/A  [dd] Prog until 12 wks 11/28/15  [dd] Metformin until 12/12/15  EPDS=0 03/11/16  PT for bilat carpal tunnel 03/19/16 @ 3:30pm ([address])  [dd]GDM - [dd] endocrin appt -03/24/16 @ 2:30pm [facility]          [dd] nutritionist appt  [dd] diabetic nurse appointment -          [dd] MFM-  04/09/16 @2pm  [address]  [dd] A1C-wnl   (05/12/2016 13:43)               Global Obstetrical Plan: [name],P1,08/29/13 NVSD Boy) [dd] Flu 05/2015  [dd] EDC 06/10/16 by LMP(boy)   DM                [dd]Tdap gvn 03/26/16  [dd] AMA/ERA-NIPT-Neg/ BOY  [dd]MSAFP only=12/26/2015 0.54 MOM  [dd]18 wks FS @ 01/09/16 [facility]-incomplete views of spine- f/u           to complete survey-01/23/16 [address]-COMPLETE NL FS   [dd] 7/26/17GS-3-13  [dd] GS 05/12/16 @ [address]- VTX, 2,573g, 5-11, 31%  HGBEG- Neg  HSV 1&2- neg  Hep panels- Neg  Rubella - Imm  varicella- Imm  Parvavirus -unknown   Toxo - Neg  Adv otc ca++  blood type - A+  CF,SMA,Fragile X- [dd] Counsyl drawn 10/31/15-NEGATIVE  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 Ernest Haber  EPDS 3  Preferred Lab - N/A  [dd] Prog until 12 wks 11/28/15  [dd] Metformin until 12/12/15  EPDS=0 03/11/16  PT for bilat carpal tunnel 03/19/16 @ 3:30pm ([address])  [dd]GDM - [dd]  endocrin appt -03/24/16 @ 2:30pm [facility]          [dd] nutritionist appt  [dd] diabetic nurse appointment -          [dd] MFM-  04/09/16 @2pm  [address]  [dd] A1C-wnl

## 2016-05-13 ENCOUNTER — Ambulatory Visit

## 2016-05-14 ENCOUNTER — Ambulatory Visit

## 2016-05-14 NOTE — Progress Notes (Signed)
OB Interval Visit        Flowsheet View for Follow-up Visit     Estimated weeks of        gestation:  36 1/7     Weight:  160     Blood pressure: 100 / 60     Urine Protein:  neg     Urine Glucose: neg     Urine Nitrite:  neg     Headache:  No     Nausea/vomiting: No     Edema:  0     Vaginal bleeding: no     Vaginal discharge: no     Fundal height:    36     FHR:   146     Fetal activity:  yes     Labor symptoms: no     Cx Dilation:  0     Cx Effacement: 0%     Cx Station:  high     Taking prenatal vits? Y     Smoking:  n/a     Next visit:  1 wk     Comment:  reviewed [facility], s/s of labor and when and where she should report when she is in lobor, ruptured bag of water, vaginla bleeding, decrese fetal movement, extremities swelling , headahe, vision chage or severe right upper abodomenal pain. CRICO consent received and quequed to chart and adv pt to bring to hospital the original copy.        Vital Signs   Weight: 160 lb. (72.73 kg.)  Height: 60.75 in. (154.31 cm.)    Body Mass Index: 30.59  Body Surface Area (m2): 1.71            Blood Pressure #1: 100 / 60 mm Hg (right arm)        Patient in pain? N    Urinalysis (dipstick)   Protein: neg; Glucose: neg mg/dl;  Leukocyte esterase: neg; Nitrite: neg; No Translator Needed   Paige [name].....................Marland KitchenAugust 30, 2017 5:21 PM    Current Problems (No new problems added) - Reviewed Today  [redacted] WEEKS GESTATION OF PREGNANCY (ICD-V28.9) (ICD10-Z3A.36)  CERVICAL LYMPHADENOPATHY (ICD-785.6) (ICD10-R59.0)  HYPERLIPIDEMIA (ICD-272.4) (ICD10-E78.5)  PREVENTIVE HEALTH CARE (ICD-V70.0) (ICD10-Z00.00)  ECZEMA (ICD-692.9) (ICD10-L30.9)  ROUTINE GYNECOLOGICAL EXAMINATION (ICD-V72.31) (ICD10-Z01.419)      POLYCYSTIC OVARIAN SYNDROME; 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)  SUPERVISION OF OTHER HIGH RISK PREGNANCIES, THIRD TRIMESTER (ICD-V23.89) (ICD10-O09.893)      GESTATIONAL DIABETES (ICD-648.80) (ICD10-O24.419)    Current Medications (No new medications added) -  Reviewed Today  TRIAMCINOLONE ACETONIDE 0.1 % CREA (TRIAMCINOLONE ACETONIDE) apply two times daily as needed to  rash - apply sparingly  LORATADINE 10 MG TABS (LORATADINE) take one by mouth daily as needed for allergies  METFORMIN HCL 500 MG TABS (METFORMIN HCL) take one by mouth twice daily for PCO  FISH OIL 1000 MG CAPS (OMEGA-3 FATTY ACIDS) 1 by mouth once daily  CVS PRENATAL MULTI+DHA CAPS (PRENATAL MV-MIN-FE FUM-FA-DHA CAPS)   CALCIUM CARBONATE-VITAMIN D 600-400 MG-UNIT TABS (CALCIUM CARBONATE-VITAMIN D) Take one tablet by mouth daily  ONETOUCH VERIO STRP (GLUCOSE BLOOD) check four times a day  ONETOUCH DELICA LANCETS 33G MISC (LANCETS) check four times a day  ONETOUCH VERIO STRP (GLUCOSE BLOOD) check four times a day    Current Allergies (No new allergies added) - Reviewed Today  * NO KNOWN DRUG  ALLERGIES.        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 ; she has an arcuate uterus and hystersocpy    Family History  Mother -84s;  atrial fibrillation - cardioverted - high cholesterol  Father - 74s HL and kidney stone  one brother, high cholesterol   2 sisters     Social History  married, one son [name] - works full time at [employer] - [place]  Scientist, clinical (histocompatibility and immunogenetics) degree    Risk Factors  Tobacco Use:  never smoked  Passive smoke exposure: No  Alcohol Use:  no  Substance Abuse:  no  Caffeine (drinks/day):  1 cup of tea  Sun exposure:  rarely  Exercise (times/week):  yes  Seatbelt use (%):  100  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 No  Knee Replacement? No  Pacemaker? No  Heart Murmur? No  Antibiotic prior to dental work? No      Trauma/Violence Hx   Partner present, please ask at later date           Problems added or changed during this visit: [redacted] WEEKS GESTATION OF PREGNANCY (ICD-V28.9) (ICD10-Z3A.36)      Prenatal Visit Assessment and Plan: reviewed [facility], s/s of labor and when and where she should report when she is in lobor, ruptured bag of water, vaginla bleeding, decrese fetal movement, extremities swelling , headahe, vision chage or severe right upper abodomenal pain. CRICO consent received and quequed to chart and adv pt to bring to hospital the original copy.         Global Obstetrical Plan: [name],P1,08/29/13 NVSD Boy) [dd] Flu 05/2015  [dd] EDC 06/10/16 by LMP(boy)   DM                [dd]Tdap gvn 03/26/16  [dd] AMA/ERA-NIPT-Neg/ BOY  [dd]MSAFP only=12/26/2015 0.54 MOM  [dd]18 wks FS @ 01/09/16 [facility]-incomplete views of spine- f/u            to complete survey-01/23/16 [address]-COMPLETE NL FS   [dd] 7/26/17GS-3-13  [dd] GS 05/12/16 @ [address]- VTX, 2,573g, 5-11, 31%  HGBEG- Neg  HSV 1&2- neg  Hep panels- Neg  Rubella - Imm  varicella- Imm  Parvavirus -unknown   Toxo - Neg  Adv otc ca++  blood type - A+  CF,SMA,Fragile X- [dd] Counsyl drawn 10/31/15-NEGATIVE  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 Ernest Haber  EPDS 3  Preferred Lab - N/A  [dd] Prog until 12 wks 11/28/15  [dd] Metformin until 12/12/15  EPDS=0 03/11/16  PT for bilat carpal tunnel 03/19/16 @ 3:30pm ([address])  [dd]GDM - [dd] endocrin appt -03/24/16 @ 2:30pm [facility]          [dd] nutritionist appt  [dd] diabetic nurse appointment -          [dd] MFM-  04/09/16 @2pm  [address]  [dd] A1C-wnl  05/14/16 CRICO  [facility] Working [facility]: 06/10/2016      Urinalysis (dipstick)   Leukocyte esterase: neg  Nitrite: neg  Protein: neg  Glucose: neg        Orders:  Added new Service order of Patient Encounter ([id number]) - Signed  Added new Service order of OB  Visit (CPT-00010) - Signed      Education

## 2016-05-15 ENCOUNTER — Ambulatory Visit: Admitting: Obstetrics & Gynecology

## 2016-05-19 ENCOUNTER — Ambulatory Visit: Admitting: Obstetrics & Gynecology

## 2016-05-19 ENCOUNTER — Ambulatory Visit

## 2016-05-21 ENCOUNTER — Ambulatory Visit

## 2016-05-22 ENCOUNTER — Ambulatory Visit: Admitting: Obstetrics & Gynecology

## 2016-05-22 ENCOUNTER — Ambulatory Visit

## 2016-05-22 NOTE — Progress Notes (Signed)
OB Interval Visit        Flowsheet View for Follow-up Visit     Estimated weeks of        gestation:  37 2/7     Weight:  160     Blood pressure: 100 / 60     Urine Protein:  neg     Urine Glucose: neg     Urine Nitrite:  neg     Headache:  No     Nausea/vomiting: No     Edema:  0     Vaginal bleeding: no     Vaginal discharge: no     Fundal height:    37     FHR:   146     Fetal activity:  yes     Labor symptoms: no     Cx Dilation:  0     Cx Effacement: 0%     Cx Station:  high     Taking prenatal vits? Y     Smoking:  n/a     Next visit:  1 wk     Comment:  reviewed [facility], s/s of labor and when and where she should report when she is in lobor, ruptured bag of water, vaginla bleeding, decrese fetal movement, extremities swelling , headahe, vision chage or severe right upper abodomenal pain.   she was advised to call office # and push 0 if we are not in office and ususlly I will call them back within 5-10' and called again if they did not get a return call from me. she should proceed to L/D immediately if she did not here from me after 2nd call.    discussed diet and adv no cutting down to the amt of food and caloeries      Vital Signs   Weight: 160 lb. (72.73 kg.)  Height: 60.75 in. (154.31 cm.)    Body Mass Index: 30.59  Body Surface Area (m2): 1.71            Blood Pressure #1: 100 / 60 mm Hg (right arm)        Patient in pain? N    Urinalysis (dipstick)   Protein: neg; Glucose: neg mg/dl;  Leukocyte esterase: neg; Nitrite: neg; No Translator Needed   Jerry Caras.....................Marland KitchenSeptember  7, 2017 8:53 AM    Current Problems (No new problems added) - Reviewed Today  GESTATION PERIOD GREATER THAN OR EQUAL TO 37 WEEKS [phone]) (ICD10-Z3A.37)  CERVICAL LYMPHADENOPATHY (ICD-785.6) (ICD10-R59.0)  HYPERLIPIDEMIA (ICD-272.4) (ICD10-E78.5)  PREVENTIVE HEALTH CARE (ICD-V70.0) (ICD10-Z00.00)  ECZEMA (ICD-692.9) (ICD10-L30.9)  ROUTINE GYNECOLOGICAL EXAMINATION (ICD-V72.31) (ICD10-Z01.419)      POLYCYSTIC  OVARIAN SYNDROME; 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)  SUPERVISION OF OTHER HIGH RISK PREGNANCIES, THIRD TRIMESTER (ICD-V23.89) (ICD10-O09.893)      GESTATIONAL DIABETES (ICD-648.80) (ICD10-O24.419)    Current Medications (No new medications added) - Reviewed Today  TRIAMCINOLONE ACETONIDE 0.1 % CREA (TRIAMCINOLONE ACETONIDE) apply two times daily as needed to  rash - apply sparingly  LORATADINE 10 MG TABS (LORATADINE) take one by mouth daily as needed for allergies  METFORMIN HCL 500 MG TABS (METFORMIN HCL) take one by mouth twice daily for PCO  FISH OIL 1000 MG CAPS (OMEGA-3 FATTY ACIDS) 1 by mouth once daily  CVS PRENATAL MULTI+DHA CAPS (PRENATAL MV-MIN-FE FUM-FA-DHA CAPS)   CALCIUM CARBONATE-VITAMIN D 600-400 MG-UNIT TABS (CALCIUM CARBONATE-VITAMIN D) Take one tablet by mouth daily  ONETOUCH VERIO STRP (GLUCOSE BLOOD) check four times a day  ONETOUCH DELICA LANCETS  33G MISC (LANCETS) check four times a day  ONETOUCH VERIO STRP (GLUCOSE BLOOD) check four times a day    Current Allergies (No new allergies added) - Reviewed Today  * NO KNOWN DRUG  ALLERGIES.        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 ; she has an arcuate uterus and hystersocpy    Family History  Mother -79s;  atrial fibrillation - cardioverted - high cholesterol  Father - 2s HL and kidney stone  one brother, high cholesterol   2 sisters     Social History  married, one son [name] - works full time at [employer] - [place]  Scientist, clinical (histocompatibility and immunogenetics) degree    Risk Factors  Tobacco Use:  never smoked  Passive smoke exposure: No  Alcohol Use:  no  Substance Abuse:  no  Caffeine (drinks/day):  1 cup of tea  Sun exposure:  rarely  Exercise (times/week):  yes  Seatbelt use (%):  100  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 No  Knee Replacement? No  Pacemaker? No  Heart Murmur? No  Antibiotic prior to dental work? No      Trauma/Violence Hx   Partner present, please ask at later date           Problems added or changed during this visit: GESTATION PERIOD GREATER THAN OR EQUAL TO 37 WEEKS [phone]) (ICD10-Z3A.37)      Prenatal Visit Assessment and Plan: reviewed [facility], s/s of labor and when and where she should report when she is in lobor, ruptured bag of water, vaginla bleeding, decrese fetal movement, extremities swelling , headahe, vision chage or severe right upper abodomenal pain.   she was advised to call office # and push 0 if we are not in office and ususlly I will call them back within 5-10' and called again if they did not get a return call from me. she should proceed to L/D immediately if she did not here from me after 2nd call.    discussed diet and adv no cutting down to the amt of food and caloeries       Global Obstetrical Plan: [name],P1,08/29/13 NVSD Boy) [dd] Flu 05/2015  [dd] EDC 06/10/16 by LMP(boy)   DM                [dd]Tdap gvn 03/26/16  [dd] AMA/ERA-NIPT-Neg/ BOY  [dd]MSAFP only=12/26/2015 0.54 MOM  [dd]18 wks FS @ 01/09/16 [facility]-incomplete views of spine- f/u            to complete survey-01/23/16 [address]-COMPLETE NL FS   [dd] 7/26/17GS-3-13  [dd] GS 05/12/16 @ [address]- VTX, 2,573g, 5-11, 31%  HGBEG- Neg  HSV 1&2- neg  Hep panels- Neg  Rubella - Imm  varicella- Imm  Parvavirus -unknown   Toxo - Neg  Adv otc  ca++  blood type - A+  CF,SMA,Fragile X- [dd] Counsyl drawn 10/31/15-NEGATIVE  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 Ernest Haber  EPDS 3  Preferred Lab - N/A  [dd] Prog until 12 wks 11/28/15  [dd] Metformin until 12/12/15  EPDS=0  03/11/16  PT for bilat carpal tunnel 03/19/16 @ 3:30pm ([address])  [dd]GDM - [dd] endocrin appt -03/24/16 @ 2:30pm [facility]          [dd] nutritionist appt  [dd] diabetic nurse appointment -          [dd] MFM-  04/09/16 @2pm  [address]  [dd] A1C-wnl  05/14/16 CRICO  still diet control  [dd]05/22/2016 breast feeding reviewed and information       given.consent signed. she has no questions and she plans to       breast feed after giving birth.  stil diet control          Best Working [facility]: 06/10/2016      Urinalysis (dipstick)   Leukocyte esterase: neg  Nitrite: neg  Protein: neg  Glucose: neg        Orders:  Added new Service order of Patient Encounter ([id number]) - Signed  Added new Service order of OB Visit (CPT-00010) - Signed      Education

## 2016-05-26 ENCOUNTER — Ambulatory Visit: Admitting: Obstetrics & Gynecology

## 2016-05-28 ENCOUNTER — Ambulatory Visit

## 2016-05-29 ENCOUNTER — Ambulatory Visit

## 2016-05-29 ENCOUNTER — Ambulatory Visit: Admitting: Obstetrics & Gynecology

## 2016-05-29 NOTE — Progress Notes (Signed)
OB Interval Visit        Flowsheet View for Follow-up Visit     Estimated weeks of        gestation:  38 2/7     Weight:  160     Blood pressure: 114 / 70     Urine Protein:  trace     Urine Glucose: neg     Urine Nitrite:  neg     Headache:  No     Nausea/vomiting: No     Edema:  0     Vaginal bleeding: no     Vaginal discharge: no     Fundal height:    38     FHR:   147     Fetal activity:  yes     Labor symptoms: no     Cx Dilation:  0     Cx Effacement: 0%     Cx Station:  high     Taking prenatal vits? Y     Smoking:  n/a     Next visit:  1 wk     Comment:  reviewed F[facility], s/s of labor and when and where she should report when she is in lobor, ruptured bag of water, vaginla bleeding, decrese fetal movement, extremities swelling , headahe, vision chage or severe right upper abodomenal pain.   she was advised to call office # and push 0 if we are not in office and ususlly I will call them back within 5-10' and called again if they did not get a return call from me. she should proceed to L/D immediately if she did not here from me after 2nd call.          Vital Signs   Weight: 160 lb. (72.73 kg.)  Height: 60.75 in. (154.31 cm.)    Body Mass Index: 30.59  Body Surface Area (m2): 1.71            Blood Pressure #1: 114 / 70 mm Hg (right arm)        Patient in pain? 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)  SUPERVISION OF OTHER HIGH RISK PREGNANCIES, THIRD TRIMESTER (ICD-V23.89) (ICD10-O09.893)      GESTATIONAL  DIABETES (ICD-648.80) (ICD10-O24.419)    Current Medications - Reviewed Today  TRIAMCINOLONE ACETONIDE 0.1 % CREA (TRIAMCINOLONE ACETONIDE) apply two times daily as needed to  rash - apply sparingly  LORATADINE 10 MG TABS (LORATADINE) take one by mouth daily as needed for allergies  METFORMIN HCL 500 MG TABS (METFORMIN HCL) take one by mouth twice daily for PCO  FISH OIL 1000 MG CAPS (OMEGA-3 FATTY ACIDS) 1 by mouth once daily  CVS PRENATAL MULTI+DHA CAPS (PRENATAL MV-MIN-FE FUM-FA-DHA CAPS)   CALCIUM CARBONATE-VITAMIN D 600-400 MG-UNIT TABS (CALCIUM CARBONATE-VITAMIN D) Take one tablet by mouth daily  ONETOUCH VERIO STRP (GLUCOSE BLOOD) check four times a day  ONETOUCH DELICA LANCETS 33G MISC (LANCETS) check four times a day  ONETOUCH VERIO STRP (GLUCOSE BLOOD) check four times a day    Current Allergies (No new allergies added) - Reviewed Today  * NO  KNOWN DRUG  ALLERGIES.        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 ; she has an arcuate uterus and hystersocpy    Family History  Mother -19s;  atrial fibrillation - cardioverted - high cholesterol  Father - 42s HL and kidney stone  one brother, high cholesterol   2 sisters     Social History  married, one son [name] - works full time at [employer] - [place]  Scientist, clinical (histocompatibility and immunogenetics) degree    Risk Factors  Tobacco Use:  never smoked  Passive smoke exposure: No  Alcohol Use:  no  Substance Abuse:  no  Caffeine (drinks/day):  1 cup of tea  Sun exposure:  rarely  Exercise (times/week):  yes  Seatbelt use (%):  100  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 No  Knee Replacement? No  Pacemaker? No  Heart Murmur? No  Antibiotic prior to dental work? No      Trauma/Violence Hx   Partner present, please ask at later date             Prenatal Visit Assessment and Plan: reviewed [facility], s/s of labor and when and where she should report when she is in lobor, ruptured bag of water, vaginla bleeding, decrese fetal movement, extremities swelling , headahe, vision chage or severe right upper abodomenal pain.   she was advised to call office # and push 0 if we are not in office and ususlly I will call them back within 5-10' and called again if they did not get a return call from me. she should proceed to L/D immediately if she did not here from me after 2nd call.           Global Obstetrical Plan: [name],P1,08/29/13 NVSD Boy) [dd] Flu 05/2015  [dd] EDC 06/10/16 by LMP(boy)DM-diet control [dd]Tdap gvn07/12/17  [dd] AMA/ERA-NIPT-Neg/ BOY  [dd]MSAFP only=12/26/2015 0.54 MOM  [dd]18 wks  complete survey-01/23/16 [address]-COMPLETE NL FS   [dd] 7/26/17GS-3-13  [dd] GS 05/12/16 @ [address]- VTX, 2,573g, 5-11, 31%  HGBEG- Neg  HSV 1&2- neg  Hep panels- Neg  Rubella - Imm  varicella- Imm  Parvavirus -unknown   Toxo - Neg  Adv otc ca++  blood type - A+  CF,SMA,Fragile X- [dd] Counsyl drawn 10/31/15-NEGATIVE  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 Ernest Haber  EPDS 3  Preferred Lab - N/A  [dd] Prog until 12 wks 11/28/15  [dd] Metformin until 12/12/15  EPDS=0 03/11/16  PT for bilat carpal tunnel 03/19/16 @ 3:30pm ([address])  [dd]GDM - [dd] endocrin appt -03/24/16 @ 2:30pm [facility]          [dd] nutritionist appt  [dd] diabetic nurse appointment -          [dd] MFM-  04/09/16 @2pm  [address]          [  dd] A1C-wnl  [dd]05/22/2016 breast feeding reviewed and information             given.consent signed. she has no questions and she plans to          breast feed after giving birth.  05/14/16 CRICO            Best Working  [facility]: 06/10/2016      Urinalysis (dipstick)   Leukocyte esterase: trace  Nitrite: neg  Protein: trace  Glucose: neg        Orders:  Added new Service order of Patient Encounter ([id number]) - Signed  Added new Service order of OB Visit (CPT-00010) - Signed      Education

## 2016-05-29 NOTE — Progress Notes (Signed)
Receipt of: OB Interval Visit: week 38+    The following were sent to Wendall Mola' at [facility] .[email or web address] on 05/30/2016 12:08:35 AM:     - Secure message created from ACM template     - Attachment created from [facility] template

## 2016-05-30 ENCOUNTER — Ambulatory Visit

## 2016-06-02 ENCOUNTER — Ambulatory Visit

## 2016-06-02 ENCOUNTER — Ambulatory Visit: Admitting: Obstetrics & Gynecology

## 2016-06-04 ENCOUNTER — Ambulatory Visit

## 2016-06-05 ENCOUNTER — Ambulatory Visit

## 2016-06-05 ENCOUNTER — Ambulatory Visit: Admitting: Obstetrics & Gynecology

## 2016-06-05 NOTE — Telephone Encounter (Signed)
Phone Note -     Other Incoming Call    Routine    Call back at Cell [phone]  Initial call taken by: Halina Andreas,  June 05, 2016 9:59 AM  Actual Caller: Patient  Initial Details of Call:  Pt says she is supposed to be induced on Saturday but wants to know if she can change it to Monday instead because of husbands work schedule. If not that is alright.      Follow-up #1  Details: Dr Dorna Bloom said she would rather keep it on Saturday. Pt accepted.  Action: Phone call completed  By: Halina Andreas ~ June 05, 2016 10:02 AM

## 2016-06-05 NOTE — Progress Notes (Signed)
OB Interval Visit        Flowsheet View for Follow-up Visit     Estimated weeks of        gestation:  39 2/7     Weight:  161     Blood pressure: 116 / 76     Urine Protein:  trace     Urine Glucose: neg     Urine Nitrite:  neg     Headache:  No     Nausea/vomiting: No     Edema:  0     Vaginal bleeding: no     Vaginal discharge: no     Fundal height:    39     FHR:   146     Fetal activity:  yes     Labor symptoms: no     Fetal position:  vertex     Cx Dilation:  1+     Cx Effacement: 50%     Cx Station:  -3     Taking prenatal vits? Y     Smoking:  n/a     Next visit:  1 wk     Comment:  Pt wants to discuss flu vaccine.- flu vaccine today  NST/BPP on monday was wnl    discussed IOL on sat @ 5pm - she had IOL before  Risks of Induction of labor  1. hyperstimulation  2. the need of using Terbutaline to decrease the frequency of uterine contraction and the side effects of Terbutaline such as increaseing maternal heart rate  3. fetal deceleration and the need fo emergency cesarean section and the need of future cesarean section if she has one.  4. ruptured uterus  These risks are very small but it is not zero.        Vital Signs   Weight: 161 lb. (73.18 kg.)  Height: 60.75 in. (154.31 cm.)    Body Mass Index: 30.78  Body Surface Area (m2): 1.72            Blood Pressure #1: 116 / 76 mm Hg (right arm)        Urinalysis (dipstick)   Protein: trace; Glucose: neg mg/dl;  Leukocyte esterase: neg; Nitrite: neg; No Translator Needed   [name],[place].....................Marland KitchenSeptember 21, 2017 8:17 AM    Current Allergies (No new allergies added) - Reviewed Today  * NO KNOWN DRUG  ALLERGIES.      Done  Family Medical History    -- Negative history of Colon Cancer   -- Negative history of Breast Cancer    -- Negative history of Other Cancer   -- Negative history of Diabetes   -- Positive history of Hyperlipidemia   -- Negative history of Hypertension   -- Positive history of CAD   -- Negative history of Stroke         Hip  Replacement? No  Knee Replacement? No  Pacemaker? No  Heart Murmur? No  Antibiotic prior to dental work? No      Trauma/Violence Hx   Partner present, please ask at later date        Vaccines Ordered: Flu IM  Vaccine Counselling by Provider     Vaccines Given  Flu IM     Dose:  0.5 mL  Given By:  Halina Andreas  Manufacturer:  Seqirus     Lot Number:  [id number]     Expiration Date:  01/12/2017  Vaccine Type:  Flucelvax Quad Preservative Free (>4 yrs.) [CVX171]  NDC Number:  [id number]  VIS Version Provided, Date:  05/27/2016     Source:  Private Purchase  Route:  IM     Site:  Left Deltoid    Patient was observed for 20 mins after vacc admin  Flu IM Adverse Reaction: none           Problems added or changed during this visit: [redacted] WEEKS GESTATION OF PREGNANCY (ICD-V28.9) (ICD10-Z3A.39)      Prenatal Visit Assessment and Plan: Pt wants to discuss flu vaccine.- flu vaccine today  NST/BPP on monday was wnl    discussed IOL on sat @ 5pm - she had IOL before  Risks of Induction of labor  1. hyperstimulation  2. the need of using Terbutaline to decrease the frequency of uterine contraction and the side effects of Terbutaline such as increaseing maternal heart rate  3. fetal deceleration and the need fo emergency cesarean section and the need of future cesarean section if she has one.  4. ruptured uterus  These risks are very small but it is not zero.         [employer] Obstetrical Plan: [name],P1,08/29/13 NVSD Boy) [dd]Flu 06/05/2016  [dd] EDC 06/10/16 by LMP(boy)DM-diet control [dd]Tdap gvn07/12/17  [dd] AMA/ERA-NIPT-Neg/ BOY  [dd]MSAFP only=12/26/2015 0.54 MOM  [dd]18 wks  complete survey-01/23/16 [address]-COMPLETE NL FS   [dd] 7/26/17GS-3-13  [dd] GS 05/12/16 @ 50 Rowe- VTX, 2,573g, 5-11, 31%  HGBEG- Neg  HSV 1&2- neg  Hep panels- Neg  Rubella - Imm  varicella- Imm  Parvavirus -unknown   Toxo - Neg  Adv otc ca++  blood type - A+  CF,SMA,Fragile X- [dd] Counsyl drawn 10/31/15-NEGATIVE  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 Ernest Haber  EPDS 3  Preferred Lab - N/A  [dd] Prog until 12 wks 11/28/15  [dd] Metformin until 12/12/15  EPDS=0 03/11/16  PT for bilat carpal tunnel 03/19/16 @ 3:30pm ([address])  [dd]GDM - [dd] endocrin appt -03/24/16 @ 2:30pm [facility]          [dd] nutritionist appt  [dd] diabetic nurse appointment -          [dd] MFM-  04/09/16 @2pm  [address]          [dd] A1C-wnl  [dd]05/22/2016 breast feeding reviewed and information             given.consent signed. she has no questions and she plans to          breast feed after giving birth.  05/14/16 CRICO            Best Working EDC: 06/10/2016      Urinalysis (dipstick)   Leukocyte esterase: neg  Nitrite: neg  Protein: trace  Glucose: neg        Orders:  Added new Service order of Patient Encounter ([id number]) - Signed  Added new Service order of Influenza Quadrivalent (P-Free) 4 yrs & older ([id number]) - Signed  Added new Service order of Administered Vacc #1 ([id number]) - Signed  Added new Service order of OB Visit (CPT-00010) - Signed      Education

## 2016-06-06 ENCOUNTER — Ambulatory Visit

## 2016-06-07 ENCOUNTER — Ambulatory Visit

## 2016-06-07 ENCOUNTER — Ambulatory Visit: Admitting: Obstetrics & Gynecology

## 2016-06-07 ENCOUNTER — Inpatient Hospital Stay
Admit: 2016-06-07 | Disposition: A | Discharge status: Home / Self Care | Source: Ambulatory Visit | Attending: Obstetrics & Gynecology | Admitting: Obstetrics & Gynecology

## 2016-06-07 LAB — HX CBC W/DIFFX
HX ABSOLUTE BASOPHILS COUNT: 0.03 10*3/uL (ref 0.0–0.33)
HX ABSOLUTE EOSINOPHIL COUNT: 0.12 10*3/uL (ref 0.0–0.88)
HX ABSOLUTE LYMPHS COUNT: 2.95 10*3/uL (ref 0.99–4.84)
HX ABSOLUTE MONOCYTES COUNT: 0.72 10*3/uL (ref 0.18–1.21)
HX ABSOLUTE NEUTROPHIL CNT: 8.03 10*3/uL — ABNORMAL HIGH (ref 1.8–7.7)
HX BASOS: 0.3 %
HX EOS: 1 %
HX HEMATOCRIT: 40.9 % (ref 36.0–46.0)
HX HEMOGLOBIN: 13.9 g/dL (ref 12.0–16.0)
HX IMMATURE GRANULOCYTES: 0.8 % (ref 0.0–1.0)
HX LYMPHS: 24.7 %
HX MEAN CORP.HEMO.CONC.: 34 g/dL (ref 31.0–37.0)
HX MEAN CORPUSCULAR HEMOGLOBIN: 31.4 pg (ref 26.0–34.0)
HX MEAN CORPUSCULAR VOLUME: 92.3 fL (ref 80.0–100.0)
HX MEAN PLATELET VOLUME: 11.4 fL (ref 9.4–12.4)
HX MONOS: 6 %
HX PLATELET COUNT: 166 10*3/uL (ref 150.0–400.0)
HX POLYS: 67.2
HX RED BLOOD COUNT: 4.4 M/uL (ref 4.0–5.2)
HX RED CELL DISTRIBUTION WIDTH SD: 48.1 fL (ref 35.0–51.0)
HX WHITE BLOOD COUNT: 12.4 10*3/uL — ABNORMAL HIGH (ref 4.5–11.0)

## 2016-06-07 LAB — HX RTN. URINE WITH REFLEX
HX ASCORBIC ACID URINE: NEGATIVE
HX URINE BILE: NEGATIVE
HX URINE BLOOD: NEGATIVE
HX URINE ESTERASE: NEGATIVE
HX URINE GLUCOSE: NEGATIVE
HX URINE KETONES: NEGATIVE
HX URINE NITRITE: NEGATIVE
HX URINE PH: 7 (ref 5.0–8.0)
HX URINE PROTEIN: NEGATIVE
HX URINE SPECIFIC GRAVITY: 1.005 (ref 1.003–1.03)
HX UROBILINOGEN, URINE: NEGATIVE

## 2016-06-07 LAB — HX  COMPLETE BLOOD COUNT

## 2016-06-07 LAB — HX PATIENT TYPE
HX 24HR URINE PROTEIN: 0
HX A CELL: 0
HX GLOMERULAR FILTRATION RATE: 0

## 2016-06-07 LAB — HX COMPLETE BLOOD COUNT: HX ORDERED TEST: NEGATIVE

## 2016-06-07 LAB — HX GLUCOSEX
HX GLUCOSE: 105 mg/dL — ABNORMAL HIGH (ref 70.0–100.0)
HX GLUCOSE: 85 mg/dL (ref 70.0–100.0)

## 2016-06-07 LAB — HX BEDSIDE METER GLUCOSE TESTING: HX BEDSIDE METER GLUCOSE TESTING: 82 mg/dL (ref 70.0–100.0)

## 2016-06-07 NOTE — Discharge Summary (Signed)
DISCHARGE SUMMARY  Rpt # [id number]               [facility]                              [facility]                                  [address]                                  [place], [place]  [address]                                    [phone]                                  Patient:            Paige [name], STILLS                  DOB:                1980/11/28  Pt. Location:       M.MAT6           MR#:                [id number]    Admission Date:     06/07/16 Acct #: [id number]  Attending M.D.:     Sherald Barge MD               Report Status:      Signed     ________________________________________________________________________________                                      DISCHARGE SUMMARY           DATE OF ADMISSION:  06/07/2016  DATE OF DISCHARGE:  06/10/2016     DIAGNOSIS:  Thirty-nine plus weeks gestational age with diet-controlled   gestational diabetes.      PROCEDURE:  She had Cervidil placed on September 23rd.  She had a spontaneous   vaginal delivery on September 24 at 6:49 p.m.  Baby is a viable female, birth   weight 6 pounds 12 ounces with Apgars 9 at 1 minute, 9 at 5 minutes.  Baby   had a circumcision on postpartum day 1 without any problems.     HOSPITAL COURSE:  Patient was admitted to the hospital for induction of labor   with Cervidil, followed by Pitocin.  She had an epidural for pain management.    She had a spontaneous vaginal delivery at 6:49 p.m. on September 24.  Baby is   a viable female, birth weight 6 pounds 12 ounces with Apgar 9 at 1 minute, 9 at   5 minutes.  Baby had a circumcision on postpartum day 2 without any problems.    At time of discharge, she is breast feeding.  Her breasts are soft,   nontender.  Signs and symptoms of breast infection reviewed with her.  She   was advised to continue take her  prenatal vitamins and drink lots of fluids.    Her abdomen is soft, nontender.  Fundus firm and nontender.  Lochia within   normal.  Extremity no edema.   Back, no CVA tenderness.  She was discharged   home with no medications.  She was to continue to take her prenatal vitamins   and drink lots of fluids and eat well.  She can take over-the-counter pain   medication for pain control.  Otherwise, she will make appointment in the   office in 6 weeks or earlier.        ________________________________________  Dictated By:  Sherald Barge, MD     D: 06/11/2016 10:21  T: 06/11/2016 19:03  Voice#/Text#: [id number]  JSW/pi        THIS DOCUMENT IS A DRAFT AND WAS REPRODUCED FROM ITS ELECTRONIC FORM FOR  INFORMATIONAL PURPOSES ONLY.  THIS DRAFT SHOULD NOT, UNDER ANY CIRCUMSTANCES,  BE RELIED ON AS A COMPLETE AND FINAL REPRODUCTION OF THE LEGAL MEDICAL RECORD                 UNLESS AUTHENTICATED BY THE RESPONSIBLE AUTHOR.                                        ELECTRONICALLY SIGNED                                       Sherald Barge, MD                                                 06/12/16    1428

## 2016-06-07 NOTE — Progress Notes (Signed)
[facility]   [address]   [place], [place] [address]  Office: [phone] Fax: [phone]          Name   Paige [name]             Patient #   [id number]    Visit Date Weeks Gest Wt BP UPro UGlu UNit HA N/V Edema VagBld VagD/C   06/05/2016 39 2/7 161 116 / 76 trace neg neg No No 0 no no   05/29/2016 38 2/7 160 114 / 70 trace neg neg No No 0 no no   05/22/2016 37 2/7 160 100 / 60 neg neg neg No No 0 no no   05/14/2016 36 1/7 160 100 / 60 neg neg neg No No 0 no no   05/08/2016 35 2/7 160 106 / 64 trace neg neg No No 0 no no   04/23/2016 33 1/7 161 118 / 68 neg neg neg No No 0 no no   04/09/2016 31 1/7 162 110 / 54 trace neg neg No No 0 no no   03/26/2016 29 1/7 161 110 / 60 neg neg neg No No 0 no no   03/11/2016 27 0/7 163 110 / 60 neg neg neg No No 0 no no   02/20/2016 24 1/7 160 110 / 60 neg neg neg No No 0 no no   01/23/2016 20 1/7 153 100 / 60 neg neg neg No No 0 no no   12/26/2015 16 1/7 150 110 / 60 neg neg neg + No 0 no no   11/28/2015 12 1/7 144 110 / 60 neg neg neg + No 0 no no   10/31/2015 8 1/7 144 116 / 58 NEGATIVE NEGATIVE NEGATIVE + nausea 0 no no                                                                                         Visit Date Weeks  Gest Fund  Ht FHR FetAct LbrSxs Position CxDil CxEff CxSta Vit? SmokHx RTC Res Prec   06/05/2016 39 2/7 39 146 yes no vertex 1+ 50% -3 Y n/a 1 wk     05/29/2016 38 2/7 38 147 yes no  0 0% high Y n/a 1 wk     05/22/2016 37 2/7 37 146 yes no  0 0% high Y n/a 1 wk     05/14/2016 36 1/7 36 146 yes no  0 0% high Y n/a 1 wk     05/08/2016 35 2/7 35 146 yes no  0 0% high Y n/a 1 wk     04/23/2016 33 1/7 33 146 yes no     Y n/a 2 wk     04/09/2016 31 1/7 31 146 yes no  0 0% high Y n/a 2 wk     03/26/2016 29 1/7 29 156 yes no     Y n/a 2 wk     03/11/2016 27 0/7 27 156 yes no     Y n/a 2 wk     02/20/2016 24 1/7 24 146 yes no     Y n/a  3 wk     01/23/2016 20 1/7 20 146 yes no     Y n/a 4 wk     12/26/2015 16 1/7  156 N/A no     Y n/a 4 wk     11/28/2015 12  1/7  + N/A no     Y n/a 4 wk     10/31/2015 8 1/7   N/A no  0 0% high Y n/a 4 wk                                                                                                               Name   Paige [name]             Patient #   [id number]  Hospital # [id number]     Visit Date [name] Comments   06/05/2016 39 2/7 Pt wants to discuss flu vaccine.- flu vaccine today  NST/BPP on monday was wnl    discussed IOL on sat @ 5pm - she had IOL before  Risks of Induction of labor    1. hyperstimulation    2. the need of using Terbutaline to decrease the frequency of uterine contraction and the side effects of Terbutaline such as increaseing maternal heart rate    3. fetal deceleration and the need fo emergency cesarean section and the need of future cesarean section if she has one.    4. ruptured uterus    These risks are very small but it is not zero.     05/29/2016 38 2/7 reviewed [facility], s/s of labor and when and where she should report when she is in lobor, ruptured bag of water, vaginla bleeding, decrese fetal movement, extremities swelling , headahe, vision chage or severe right upper abodomenal pain.     she was advised to call office # and push 0 if we are not in office and ususlly I will call them back within 5-10' and called again if they did not get a return call from me. she should proceed to L/D immediately if she did not here from me after 2nd call.       05/22/2016 37 2/7 reviewed [facility], s/s of labor and when and where she should report when she is in lobor, ruptured bag of water, vaginla bleeding, decrese fetal movement, extremities swelling , headahe, vision chage or severe right upper abodomenal pain.     she was advised to call office # and push 0 if we are not in office and ususlly I will call them back within 5-10' and called again if they did not get a return call from me. she should proceed to L/D immediately if she did not here from me after 2nd call.    discussed diet and adv no cutting down to  the amt of food and caloeries   05/14/2016 36 1/7 reviewed [facility], s/s of labor and when and where she should report when she is in lobor, ruptured bag of water, vaginla bleeding, decrese fetal movement, extremities  swelling , headahe, vision chage or severe right upper abodomenal pain. CRICO consent received and quequed to chart and adv pt to bring to hospital the original copy.     05/08/2016 35 2/7 GBBS done, reviewed [facility], s/s of labor and when and where she should report when she is in labor    [facility] cosent/[facility] blood transfusion consent gave pt to review and bring back to office after signed        [xxx] GS 05/12/16 @ 8am [address]  NST for GDM (diet control) 04/16/16 @ 10am           04/23/2016 33 1/7 has NST for GDM  next [place]  [xxx] 05/12/16 @ 8am [address]   04/09/2016 31 1/7 [xxx]GS @ 28 wks-04/09/16 @ 1pm 50 [address] (changed by pt)  NST for GDM (diet control) 04/16/16 @ 10am         03/26/2016 29 1/7 dx: GDM  xxx]GS @ 28 wks-04/09/16 @ 1pm [address] (changed by pt)       03/11/2016 27 0/7 1 hr GLT/CBC/RPR done today, s/s PET/PTL/[facility] reviewed    [facility] GTT handout given      [xxx]GS @ 28-32 weeks-04/16/16 @ 3:30pm 50 [address]e'    her CTS back and she  had this with her last pregnancy and she used otc device and this time is not working and will refer to PT  her back is better with mat belt   02/20/2016 24 1/7 glt in 3 weks  discussed mat belt while woking   01/23/2016 20 1/7 reviewed 18wks FS & genetic screen result. adv PNC & sleep on her side and discussed diet, exercise, activity, sex and finding a pediatrician.      [dd]MSAFP only=12/26/2015 0.54 MOM    [xxx] 18 wks FS @ 01/09/16 [facility]-incomplete views of spine- f/u to complete survey-01/23/16 @ 4:15pm [address]   [xxx]GS @ 28-32 weeks-04/16/16 @ 3:30pm [address]   12/26/2015 16 1/7 [xxx]MSAFP only @ 16 weeks   [xxx] 18 wks FS @ 01/09/16 at 10:45 am [facility]   11/28/2015 12 1/7    10/31/2015 8 1/7                                  [id number]    [name]    Impression Plan          Copyright 2005, [employer]. All rights reserved.

## 2016-06-07 NOTE — Progress Notes (Signed)
[facility]   [address]   [place], [place] [address]  Office: [phone] Fax: [phone]              Prenatal Record  PATIENT'S RH: POSITIVE  Physician:  Ellan Lambert MD [name]:  06/10/2016 Date printed: 06/07/2016 (39.57 wks )   [id number]                                                 ********Issues/Risks with this Pregnancy for Labor and Delivery********    Dr. Ernest Haber in [place]     Demographics  Name    Paige [name] DOB    04/18/1981 Age    35 Years Old Race    White Status    Married   Address  [address] Phone  [phone] Other Phone     [place]  [place]    [address] SS#  [id number]   Occupation    outside work Work [employer] Patient #    [id number]     Insurance    [employer] [name] Policy #    [id number] Guarantor    Paige [name]   FOB    Paige [name] Involved   Age Race      Occupation  Chef   FOB Phone [phone]    Support Person     Home Phone Relationship   Infant's Physician    Dr. Abe People [facility]  [facility] Feeding plans           Previous Pregnancies G 3 P 1 Term 1 Preterm  LC 1    Ab 1 SAB 1 EAB  Ectopic  Mult Preg    No Date Wks  Gest Delivery  Type Hrs  Labor Anes Location Infant Comments          Sex Weight    1 2013  SAB                2 08/29/2013 40 1/2  NSVD         [place] M 7-1 IUI, IOL for AFP  she had prog supp  Name   Paige [name]             Patient #   [id number]    Physical Exam  Gen appearance: Gen:NAD ,   Skin: no rash,   Eyes: PERLA,EOMI, no lid lag/retraction/corneal injection   Neck: no LNP   Thyroid:Thyroid normal , no nodules, No LNP, no bruit   Chest:  Symmetrical,    CV: S1 S2 normal, RRR. No M/g   Resp:CTA B/L   GI/Abd: BS+, No organomegaly   Foot: DP+,No LE edema    MSK: No joint enlargement  Neuro: AAOx3, No tremors   Psych: mood stable        Affect/Mood:    Head:  Ears:TM's intact and clear with normal canals with grossly normal hearing.    Eyes:TM's intact and clear with normal canals with grossly normal hearing.    Nose: L turbinate boggy/ erythematou, tenderness ethmoid sinus   Oral: no deformity or lesions with good dentition.    Skin:    Neck: supple, no adenopathy, no masses, thyroid normal size, no thyroid tenderness or nodules,.    Heart:RRR, no murmurs, no gallops.    Lungs: No respiratory distress, no accessory muscle use, clear to auscultation.    Breasts:   no asymmetry, no skin changes, no nipple discharge,no masses,no tenderness.    Abdomen: nondistended, nontender, no guarding, normal BS, no hepatosplenomegaly, no hernias, no CVA tenderness bilaterally.    Extremities:   Back:   Neuro:      Vulva: Normal appearance, normal hair distribution, no lesions or masses.    Vagina: Normal, rugated, physiologic discharge, no lesions, no masses, adequate pelvic support.    Cervix: Normal, no motion tenderness, no lesions.    Adnexa: Normal, no masses, mobile, nontender.    Rectal: EXTERNAL HEMORRHOIDS.    Uterus:smooth, mobile, non-tender, firm, adequate support, no prolapse, 6-8 WKS SIZE.    Urethra: Normal, no masses, non-tender, no discharge.         Additional Information      EDC / Dating Information  Current best EDC: 06/10/2016   Cycle Interval: 30 days    LMP: 09/04/2015 =EDC 06/10/2016      Reliability: Yes    Pill period? no   Preg Test Date      Initial Exam 10/31/2015 =EDC    Date Steth Tones   =EDC     Date Quickening   =EDC     U/S     Date GA =EDC   10/22/2015  06/13/2016                    Allergies  * NO KNOWN DRUG  ALLERGIES (Mild)     Paige [name]    [id number]  Medications  TRIAMCINOLONE ACETONIDE 0.1 % CREA (TRIAMCINOLONE  ACETONIDE) apply two times daily as needed to  rash - apply sparingly  LORATADINE 10 MG TABS (LORATADINE) take one by mouth daily as needed for allergies  METFORMIN HCL 500 MG TABS (METFORMIN HCL) take one by mouth twice daily for PCO  FISH OIL 1000 MG CAPS (OMEGA-3 FATTY ACIDS) 1 by mouth once daily  CVS PRENATAL MULTI+DHA CAPS (PRENATAL MV-MIN-FE FUM-FA-DHA CAPS)   CALCIUM CARBONATE-VITAMIN [name] 600-400 MG-UNIT TABS (CALCIUM CARBONATE-VITAMIN [name]) Take one tablet by mouth daily  ONETOUCH VERIO STRP (GLUCOSE BLOOD) check four times a day  ONETOUCH DELICA LANCETS 33G MISC (LANCETS) check four times a day  ONETOUCH VERIO STRP (GLUCOSE BLOOD) check four times a  day                                                        ********Risks with this Pregnancy for Labor and Delivery********    Dr. 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)  SUPERVISION OF OTHER HIGH RISK PREGNANCIES, THIRD TRIMESTER (ICD-V23.89) (ICD10-O09.893)      GESTATIONAL DIABETES (ICD-648.80) (ICD10-O24.419)    [name]37YOMFG3,P1,08/29/13 NVSD Boy) [dd]Flu 06/05/2016  [dd] EDC 06/10/16 by LMP(boy)DM-diet control [dd]Tdap gvn07/12/17  [dd] AMA/ERA-NIPT-Neg/ BOY  [dd]MSAFP only=12/26/2015 0.54 MOM  [dd]18 wks  complete survey-01/23/16 [address]-COMPLETE NL FS   [dd] 7/26/17GS-3-13  [dd] GS 05/12/16 @ [address]- VTX, 2,573g, 5-11, 31%  HGBEG- Neg  HSV 1&2- neg  Hep panels- Neg  Rubella - Imm  varicella- Imm  Parvavirus -unknown   Toxo - Neg  Adv otc ca++  blood type - A+  CF,SMA,Fragile X- [dd] Counsyl drawn 10/31/15-NEGATIVE  [employer] Paige [name]  PCP Dr.  Ernest Haber  EPDS 3  Preferred Lab - N/A  [dd] Prog until 12 wks 11/28/15  [dd] Metformin until 12/12/15  EPDS=0 03/11/16  PT for bilat carpal tunnel 03/19/16 @ 3:30pm ([address])  [dd]GDM - [dd] endocrin appt -03/24/16 @ 2:30pm Paige [name]          [dd] nutritionist appt  [dd] diabetic nurse appointment -          [dd] MFM-  04/09/16 @2pm  [address]          [dd] A1C-wnl  [dd]05/22/2016 breast feeding reviewed and information             given.consent signed. she has no questions and she plans to          breast feed after giving birth.  05/14/16 CRICO         Maternal age = 35 Years Old  Prior Operation(s): 2013 [name]&C ; she has an arcuate uterus and hystersocpy  Hx of Varicella: Hx of Disease: Immune  Hx of STD's  Smoking: never smoked  Hx Cystic fibrosis: Negative Carrier Status         Paige [name]    [id number] Laboratory  Test Date Result   Hemoglobin 03/11/2016 12.4   Hematocrit 03/11/2016 36.5   Blood Type  Rh Antibody Scr  Rh Type 10/31/2015     A POSITIVE       TSH  TSH w/ Reflex   06/20/2015   1.47   Rubella   10/31/2015   POSITIVE     Serology (RPR) 03/11/2016 NON-REACTIVE   Platelets 03/11/2016 199 K/UL   Chlamydia Culture  Chlamydia Ab         GC Culture  GC Ab   10/31/2015   NEGATIVE (RNA-PROBE)   Urine Culture 10/31/2015 <10,000 col/ml SKIN FLORA ISOLATED   PAP 10/31/2015 SOUT   HIV     HPV 11/01/2014 Negative   MSAFP     SEQUENTIAL#1     SEQUENTIAL#2     MSAFP MOM  GTT 1H 50G 03/11/2016 169   GTT FBS 03/14/2016 74    GTT  1 hr 03/14/2016 170     GTT 2 hr 03/14/2016 166     GTT 3 hr 03/14/2016 145     Test Date Result   Dating U/S 10/22/2015 8:00am @ [facility]   Survey U/S     Toxoplasmosis IGG        Toxoplasmosis IGM 10/31/2015        10/31/2015   NEGATIVE         NEGATIVE   Paige [name] 11/28/2015 Absent   Amnio     PPD     RhoGam     KB (Fetaldex)     Group B Strep cult   05/08/2016   NO BETA STREP ISOLATED   Downs Synd Odds     Trisomy 18 Odds     Hep B 10/31/2015 Non-reactive   Hep C 10/31/2015 Non-reactive    Varicella    Parvovirus IGM  Parvovirus IGG   10/31/2015         POSITIVE         Cystic Fibrosis     Last TDAP:   done07/08/2016    Last FLU : Flu IM09/21/2017      Paige [name]  07-Jul-1981                                                   ********Issues/Risks with this Pregnancy for Labor and Delivery********    Dr. Ernest Haber in [place]

## 2016-06-07 NOTE — Op Note (Signed)
OPERATIVE REPORT  Rpt # [phone]               [facility]                              [facility]                                  [address]                                  [place], [place]  [address]                                    [phone]                                  Patient:            Paige [name], REIGER                  DOB:                August 23, 1981  Pt. Location:       M.MAT6           MR #:               [id number]    Admission Date:     06/07/16 Acct #: [id number]  Attending M.D.:     Sherald Barge MD               Report Status:      Signed     ________________________________________________________________________________                                      OPERATIVE REPORT        SURGEON:  Sherald Barge, MD     DATE OF OPERATION:  06/08/2016     DATE OF DELIVERY:  September 24th at 6:49 p.m.     PREOPERATIVE DIAGNOSIS(ES):  39 weeks 4 days gestational age with diet-  controlled gestational diabetes.       POSTOPERATIVE DIAGNOSIS(ES):  39 weeks 4 days gestational age with diet-  controlled gestational diabetes.       OPERATION:  She had Cervidil placed on September 23rd at 6 p.m. and she had   Pitocin started on September 24 and a spontaneous vaginal delivery on   September 24 at 6:49 p.m. with baby in the LOA position, vertex.  No   amnioinfusion, no IUPC, no scalp clip, no episiotomy.  There is a first-  degree perineal laceration with 1 single stitch of 4-0 Vicryl.  Placenta   expelled spontaneously.  Rectal exam was intact.  Baby is a viable female,   birth weight 6 pounds 12 ounces with Apgars 9 at one minute, 9 at five   minutes.  Amniotic fluid was clear.  Sponge count and instrument count were   correct and there were no complications.     HOSPITAL COURSE:  Patient was admitted to the hospital with favorable cervix   at 39+ weeks'  gestational age with diet-controlled gestational diabetes.  She   had Cervidil placed September 23 at 6 p.m. and Cervidil was  removed after 12   hours and she was then given Pitocin on September 24.  She progressed to   complete at 5:50 after epidural management for pain.  She began to push at   6:47 p.m.  Had a spontaneous vaginal delivery at 6:49 p.m.  Once the vertex   was out, baby was in LOA position, the oropharynx was well suctioned followed   by the body of newborn.  Baby was placed on Mom's abdomen immediately   __________  skin.  Cord was clamped after milking the cord toward the baby 15   times and the cord was clamped 1 minute after delivery.  Cord blood was   obtained.  Placenta expelled spontaneously.  Exam of perineum showed a first-  degree perineal laceration, repaired with 4-0 Vicryl __________ .  The   placenta was expelled at 6:51 p.m.  The first-degree perineal laceration was   repaired with 4-0 Vicryl without any problems.  After that, rectal exam was      done and showed no injury to rectal mucosa or rectal sphincter.  Patient was   then placed on the delivery bed in the supine position without any problems.          ________________________________________     Rpt # [id number]  [facility]  [facility]  OPERATIVE REPORT                       Patient:  Paige [name], Paige [name]                  MR #:     [id number]    ________________________________________________________________________________     Dictated By:  Sherald Barge, MD     D: 06/11/2016 10:19  T: 06/11/2016 19:17  Voice#/Text#: [id number]  JSW/pi        THIS DOCUMENT IS A DRAFT AND WAS REPRODUCED FROM ITS ELECTRONIC FORM FOR  INFORMATIONAL PURPOSES ONLY.  THIS DRAFT SHOULD NOT, UNDER ANY CIRCUMSTANCES,  BE RELIED ON AS A COMPLETE AND FINAL REPRODUCTION OF THE LEGAL MEDICAL RECORD                 UNLESS AUTHENTICATED BY THE RESPONSIBLE AUTHOR.                                        ELECTRONICALLY SIGNED                                       Sherald Barge, MD                                                 06/12/16    1428

## 2016-06-08 LAB — HX BEDSIDE METER GLUCOSE TESTING
HX BEDSIDE METER GLUCOSE TESTING: 78 mg/dL (ref 70.0–100.0)
HX BEDSIDE METER GLUCOSE TESTING: 76 mg/dL (ref 70.0–100.0)
HX BEDSIDE METER GLUCOSE TESTING: 73 mg/dL (ref 70.0–100.0)
HX BEDSIDE METER GLUCOSE TESTING: 49 mg/dL — ABNORMAL LOW (ref 70.0–100.0)
HX BEDSIDE METER GLUCOSE TESTING: 91 mg/dL (ref 70.0–100.0)
HX BEDSIDE METER GLUCOSE TESTING: 112 mg/dL — ABNORMAL HIGH (ref 70.0–100.0)
HX BEDSIDE METER GLUCOSE TESTING: 102 mg/dL — ABNORMAL HIGH (ref 70.0–100.0)

## 2016-06-09 ENCOUNTER — Ambulatory Visit

## 2016-06-09 LAB — HX HEMATOCRIT + HEMOGLOBINX
HX HEMATOCRIT: 43.2 % (ref 36.0–46.0)
HX HEMOGLOBIN: 14.4 g/dL (ref 12.0–16.0)

## 2016-06-10 ENCOUNTER — Ambulatory Visit

## 2016-06-10 LAB — HX GLUCOSEX: HX GLUCOSE: 78 mg/dL (ref 70.0–100.0)

## 2016-06-10 NOTE — Progress Notes (Signed)
Referral Information:    Type:Occupational Therapy Referral [OT]  Duration: 12 Month(s)  Number of Visits: 16  Instructions:.    Service Provider Information:   [facility]  [address]  [place], [place]  [address]                  See Order Detail for completed referral information

## 2016-06-12 ENCOUNTER — Ambulatory Visit

## 2016-06-12 NOTE — Telephone Encounter (Signed)
Phone Note -     Outgoing Call    Routine    Call back at Cell [phone]  Initial call taken by: Halina Andreas,  June 12, 2016 12:37 PM  Call placed to: Patient  Summary of Call: WELLNESS CHECK COMPLETED ON 06/12/16    1.ask pt if she is doing well? YES  2.is handing this adjustment well? YES  3. Does she have enough help @ home YES, MOM, HUSBAND  4.ask pt if her baby is doing well HE'S DOING WELL  5.how is her baby feeding. BREAST FEEDING  6. how was her experience in the hospital? GOOD. EVERYONE WAS HELPFUL.  7. does she have any problems or questions? NO  8. does she want to speak to the doctor? NO    NSVD -   6 weeks PP appointent July 29, 2016 @ 11:15AM        Reason for Call: Confirm/Change/Make Appt, Get patient information  Action Taken: Phone Call Completed

## 2016-06-17 ENCOUNTER — Ambulatory Visit

## 2016-06-17 NOTE — Progress Notes (Signed)
Clinical Lists Changes    Observations:  Added new observation of # VAGNL DELV: 2  (06/17/2016 11:35)  Added new observation of LABORANESTH3: epidural  (06/17/2016 11:35)  Added new observation of PRETERMLAB 3: no  (06/17/2016 11:35)  Added new observation of FETALBW 3: 6-12 g (06/17/2016 11:35)  Added new observation of PREPREG GA 3: 39 wk (06/17/2016 11:35)  Added new observation of INFANTGEND 3: Female  (06/17/2016 11:35)  Added new observation of DEL LOCAT 3: [facility]  (06/17/2016 11:35)  Added new observation of DELTYPE 3: NSVD  (06/17/2016 11:35)  Added new observation of DELDATE 3: 06/08/2016  (06/17/2016 11:35)  Added new observation of LIVING: 2  (06/17/2016 11:35)  Added new observation of TERM: 2  (06/17/2016 11:35)  Added new observation of PARA: 2  (06/17/2016 11:35)          Past Pregnancy History      Gravida:  3     Term Births:  2     Living Children: 2     Para:   2     Aborta:  1     Spont. Ab:  1    Pregnancy # 1     Delivery date:   2013     Delivery type:   SAB    Pregnancy # 2     Delivery date:   08/29/2013     Weeks Gestation: 40 1/2      Delivery type:   NSVD     Delivery location:   [place]     Infant Sex:  Female     Birth weight:  7-1     Comments:  IUI, IOL for AFP  she had prog supp    Pregnancy # 3     Delivery date:   06/08/2016     Weeks Gestation: 39     Preterm labor:   no     Delivery type:   NSVD     Anesthesia type:   epidural     Del[facility] location:   [facility]     Infant Sex:  Female     Birth weight:  6-12      Menarche (yrs):  7  Length of period: 3-4  Sexually Active? Yes  Satisfied with Sex? Yes  Partners Female  Multiple Partners No  History of STD's? yes

## 2016-07-29 ENCOUNTER — Ambulatory Visit

## 2016-07-29 NOTE — Progress Notes (Signed)
GYN Annual Visit       Patient's PCP: Ellan Lambert MD    Vital Signs   Weight: 152 lb. (69.09 kg.)  Height: 60.75 in. (154.31 cm.)    Body Mass Index: 29.06  Body Surface Area (m2): 1.68    LMP: 09/04/2015      Blood Pressure #1: 110 / 60 mm Hg (right arm)    GYN Preventive Testing   Pap: Normal Date: 10/31/2015    Patient in pain? N  No Translator Needed   Jerry Caras.....................Marland KitchenNovember 14, 2017 11:14 AM      The Inocente Salles Postnatal Depression Scale (EPDS)   The responses are how patient felt in the last 7 days   Final Score:  3  1.  I have been able to laugh and see the funny side of things.   Score on Question 1:  0  Score 0. As much as I always could   Score 1.  Not quiet so much as now   Score 2.  Definitely not so much now.   Score 3. Not at all   2.  I have looked forward with enjoyment to things.   Score on Question: 2 0  Score 0: As much as I ever did   Score 1: Slightly less than I used to   Score 2: Significantly less than I used to   Score 3: Hardly at all   3.  I have blamed myself unnecessarily when things went wrong.   Score on Question 3:  1  Score 3: Yes, most of the time   Score 2: Yes, some of the time   Score 1: Not very often   Score 0: No, never   4. I have been anxious or worried for no good reason.   Score on Question 4:  1  Score 0: No, not at all   Score 1: Hardly ever   Score 2: Yes, sometimes   Score 3: Yes, very often   5. I have felt scared or panicky without a good reason.   Score on Question 5:  0  Score 3: Yes, quite a lot   Score 2: Yes, sometimes   Score 1: No, not much   Score 0: No, not at all   6.  I sometimes feel overwhelmed by my responsibilities.   Score on Question 6:  1  Score 3: Yes, most of the time I haven't been able to cope at all   Score 2: Yes, sometimes I haven't been coping as well as usual   Score 1: No, most of the time I have coped quite well   Score 0: No, I have been coping as well as ever   7.  I have been so unhappy that I have  had difficulty sleeping.   Score on Question 7:  0  Score 3: Yes, most of the time   Score 2: Yes, sometimes   Score 1: Not very often   Score 0: No, not at all   8.  I have felt sad or miserable.   Score on Question 8: 0  Score 3: Yes, most of the time   Score 2: Yes, quiet often   Score 1: Not very often   Score 0: No, not at all   9.  I have been so unhappy that I have been crying.   Score on Question 9:  0  Score 3: Yes, most of the time   Score 2:  Yes, sometimes   Score 1: Only occasionally   Score 0: No, never   10.  The thought of harming myself has occurred to me.   Score on Question 10:  0  Score 3: Yes, quiet often   Score 2: Sometimes   Score 1: Hardly ever   Score 0: Never       History of Present Illness:    Chief Complaint: 6 weeks pp  HPI: PCP: Dr. Ernest Haber in [place]  07/29/16  CC:Pt is here today for her 6wk PP. Pt states eveything is going well.  breast fedding  EPDS=3  she is interest in birth control today  Other c/o: none  LMP: 09/04/15  Last colonoscopy  Last BDT  Last mammogram  Last pap 10/31/15 11/01/14 NILM, HPV Neg  Any history of abnormal pap  in 2002  Marital status: Married  Lives: w/ husband and  2 sons   Servando Salina active               current partner:   85 y  Acupuncturist control:   HPV vaccine:  Y  Medications:  PNV, DHA, Calcium 600mg - Vit D 400iu once daily.       Perfoms Self Breast Exam: No  Given instructions: Yes  GYN History:   Patient's Age: 35 Years Old  Gravida: 3  Para: 2  NSVD:  2  Spont. Ab: 1  Living Children: 2  Menarche (yrs):  7  Length of period: 3-4    Sexual Health Sexually Active? Yes  Satisfied with Sex? Yes  Partners Female  Multiple Partners No  History of STD's? yes  Desire Pregnancy? 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)      POSTPARTUM EXAMINATION, NORMAL (ICD-V24.2) (ICD10-Z39.2)    Current Medications (No new medications added) - Reviewed Today  TRIAMCINOLONE ACETONIDE 0.1 % EXTERNAL CREAM (TRIAMCINOLONE ACETONIDE) apply two times daily as needed to  rash - apply sparingly  LORATADINE 10 MG ORAL TABLET (LORATADINE) take one by mouth daily as needed for allergies  METFORMIN HCL 500 MG ORAL TABLET (METFORMIN HCL) take one by mouth twice daily for PCO  FISH OIL 1000 MG ORAL CAPSULE (OMEGA-3 FATTY ACIDS) 1 by mouth once daily  [employer] PRENATAL MULTI+DHA CAPSULE (PRENATAL MV-MIN-FE FUM-FA-DHA CAPS)   CALCIUM CARBONATE-VITAMIN D 600-400 MG-UNIT ORAL TABLET (CALCIUM CARBONATE-VITAMIN D) Take one tablet by mouth daily  ONETOUCH VERIO IN VITRO STRIP (GLUCOSE BLOOD) check four times a day  ONETOUCH DELICA LANCETS 33G (LANCETS) check four times a day  ONETOUCH VERIO IN VITRO STRIP (GLUCOSE BLOOD) check four times a day    Current Allergies (No new allergies added) - Reviewed Today  * NO KNOWN DRUG  ALLERGIES.        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 ; she has an arcuate uterus and hystersocpy    Family History  Mother -34s;  atrial fibrillation - cardioverted - high cholesterol  Father - 29s HL and kidney stone  one brother, high cholesterol   2 sisters  Social History  married, one son [name] - works full time at [employer] - [place]  Scientist, clinical (histocompatibility and immunogenetics) degree    Risk Factors  Tobacco Use:  never smoked  Passive smoke exposure: No  Alcohol Use:  no  Substance Abuse:  no  Caffeine (drinks/day):  1  Sun exposure:  rarely  Exercise (times/week):  yes  Seatbelt use (%):  100  Exercise Comments: walking    Done  Family Medical History    -- Negative history  of Colon Cancer   -- Negative history of Breast Cancer    -- Negative history of Other Cancer   -- Negative history of Diabetes   -- Positive history of Hyperlipidemia   -- Negative history of Hypertension   -- Positive history of CAD   -- Negative history of Stroke         Hip Replacement? No  Knee Replacement? No  Pacemaker? No  Heart Murmur? No  Antibiotic prior to dental work? No    Violence Screening   Do you feel safe in your current relationship? Yes  Is a partner from a previous relationship making you feel unsafe now? No  Have you ever been hit, kicked, puched or otherwise hurt by someone? No  Have you ever been forced to have sex when you don't want to? No  Trauma/Violence Hx   Partner present, please ask at later date        Past Pregnancy History      Gravida:  3     Term Births:  2     Living Children: 2     Para:   2     Aborta:  1     Spont. Ab:  1    Pregnancy # 1     Delivery date:   2013     Delivery type:   SAB    Pregnancy # 2     Delivery date:   08/29/2013     Weeks Gestation: 40 1/2      Delivery type:   NSVD     Delivery location:   [place]     Infant Sex:  Female     Birth weight:  7-1     Comments:  IUI, IOL for AFP  she had prog supp    Pregnancy # 3     Delivery date:   06/08/2016     Weeks Gestation: 39     Preterm labor:   no     Delivery type:   NSVD     Anesthesia type:   epidural     Delivery location:   [facility]     Infant Sex:  Female     Birth weight:  6-12    All other pertinent systems were reviewed and are negative       Physical Exam     Constitutional: Alert, no acute distress, well hydrated, well developed, well nourished, appropriate dress,non-ill appearing.   Neck: supple, no adenopathy, no masses, thyroid normal size, no thyroid tenderness or nodules,.   Breasts: no asymmetry, no skin changes, no nipple discharge,no masses,no tenderness.   Cardiac: RRR, no murmurs, no gallops.   Pulmonary: No respiratory distress, no accessory muscle use, clear to  auscultation.   Abdomen: nondistended, nontender, no guarding, normal BS, no hepatosplenomegaly, no hernias, no CVA tenderness bilaterally.   Vulva: Normal appearance, normal hair distribution, no lesions or masses.   Urethra: Normal, no masses, non-tender, no  discharge.   Vagina: Normal, rugated, physiologic discharge, no lesions, no masses, adequate pelvic support.   Cervix: Normal, no motion tenderness, no lesions.   Uterus: smooth, mobile, non-tender, adequate support, firm, no prolapse, average size.   Adnexa: Normal, no masses, mobile, nontender.   Rectum: EXTERNAL HEMORRHOIDS.       Impression and Recommendations:    PROBLEM  #1:  POSTPARTUM EXAMINATION, NORMAL - Comment Only    The pateint was counseled regarding regular monthly self-examinations of the breasts, regular sustained exercise for @ least 30 minutes3-4 times per week, birth control options for the prevention of an unintended pregnancy, routine screening interval for mammogra, prevention of osteoporosis, importance of regular PAP smears, regular use of lap and shoulder seat belts.   She declined STD testing(gen prob, vag cx & blood tests)    Orders: Post Partum Visit          Patient Instructions:  1)  Please schedule a follow-up appointment in 1 year for pap    Orders:  Added new Service order of Patient Encounter ([id number]) - Signed  Added new Service order of Post Partum Visit ([id number]) - Signed

## 2016-07-29 NOTE — Progress Notes (Signed)
Dr. Richarda Osmond. Antony Haste S. Dorna Bloom, M.D.  Phone: [phone]    Fax: [phone]      July 29, 2016    RE: Paige [name]  [address]  [place], [place]  [address]        To Whom it May Concern:    This is to inform you that Paige [name] was seen in the office today. I have medically cleared the patient to return to work 08/31/16 with no restrictions.        Sincerely,        Malachy Chamber, MD  jsw/krl

## 2016-08-17 ENCOUNTER — Ambulatory Visit: Admitting: Obstetrics & Gynecology

## 2016-09-10 ENCOUNTER — Ambulatory Visit

## 2016-09-10 NOTE — Progress Notes (Signed)
Vital Signs     Weight: 152 lbs  on 07/29/2016 (BMI: 29.06)  Height: 60.75  in.      Temperature: 97.3 deg F.     Temp Site: oral    Pulse rate: 90  Pulse Ox (SpO2): 99  BP: 118/62      Patient is not experiencing pain    Unable to weigh  Comments: Paige [name] is a 35 Year Old Female who presents today for PPD Implant  Medications and Allergies Reviewed    Signed: Creig Hines [place].Marland KitchenMarland KitchenMarland KitchenDecember 27, 2017 11:23 AM  PHQ 2    Over the last 2 weeks, how often have you been bothered by any of the following problems?  1. Little interest or pleasure in doing things:  0   - Not at all  2. Feeling down, depressed, or hopeless:  0   - Not at all        Ordering Provider:  Margarette Asal  Vaccine Counselling by Provider   PPD:  Placed   Dose:  0.1cc  Route:  Intradermal   Location:  L forearm  [employer]   Lot Number: [id number]   Exp Date: 10/07/2015  Placed by:  Margarette Asal      TB   Placed  Lot #: [id number]  Route/Site: Intradermal  Administered by: Margarette Asal           Assessment and Plan:

## 2016-09-10 NOTE — Progress Notes (Signed)
Receipt of: PPD Implant    The following were sent to 'Paige [name]' at [email or web address] on 09/10/2016 11:48:40 AM:     - Secure message created from ACM template     - Attachment created from ACM template

## 2016-09-12 ENCOUNTER — Ambulatory Visit

## 2016-11-06 ENCOUNTER — Ambulatory Visit

## 2016-11-06 ENCOUNTER — Ambulatory Visit: Admitting: Internal Medicine

## 2016-11-06 LAB — HX TESTOSTERONE, TOTAL: HX TESTOSTERONE, TOTAL: <40 (ref <50)

## 2016-11-06 LAB — HX 2 HR GLUCOSE: HX 2 HR GLUCOSE: 80 mg/dL (ref <140)

## 2016-11-06 LAB — HX GLYCOHEMOGLOBIN
HX ESTIMATED AVERAGE GLUCOSE: 91 mg/dL
HX GLYCOHEMOGLOBIN EQUIVALENT: 0.519
HX HEMOGLOBIN A1C: 4.8 % (ref 4.2–5.8)

## 2016-11-06 LAB — HX GLUCOSEX: HX GLUCOSE: 81 mg/dL (ref 70.0–100.0)

## 2016-11-06 LAB — HX FASTING GLUCOSE (GTT): HX FASTING GLUCOSE (GTT): 81 mg/dL (ref 70.0–100.0)

## 2016-11-06 LAB — HX 1 HR GLUCOSE: HX 1 HR GLUCOSE: 128 mg/dL (ref <200)

## 2016-11-13 ENCOUNTER — Ambulatory Visit

## 2016-11-13 ENCOUNTER — Ambulatory Visit: Admitting: Internal Medicine

## 2016-11-13 NOTE — Progress Notes (Signed)
ENDOCRINOLOGY ENCOUNTER    PCP: Ellan Lambert MD  HPI: 36 y/o F G3P2 52M post parturm with PMH of PCOS,HL here for f/u of h/o GDM    2.36 y/o boy-7lb 1 ounce, was born in [place], no GDM then but was on metformin until 14 weeks and was on metformin even in this pregnancy until 14 weeks and was stopped by Dr Dorna Bloom    52M boy- 6lb 12 ounces-GDM, diet controlled    OGTT 10-2016 nl,a1c 4.9,testo <40    PCOS Diagnosed 5 years ago based on oligomenorrhea, no acne/hirsuitism-clomiphene/IUI second preg first pregnancy she lost the bay at 9 weeks,this preg-no clomiphene but was on metformin. 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)  ROUTINE GYNECOLOGICAL EXAMINATION (ICD-V72.31) (ICD10-Z01.419)      Medication List:   TRIAMCINOLONE ACETONIDE 0.1 % EXTERNAL CREAM (TRIAMCINOLONE ACETONIDE) apply two times daily as needed to  rash - apply sparingly  LORATADINE 10 MG ORAL TABLET (LORATADINE) take one by mouth daily as needed for allergies  METFORMIN HCL 500 MG ORAL TABLET (METFORMIN HCL) take one by mouth twice daily for PCO  FISH OIL 1000 MG ORAL CAPSULE (OMEGA-3 FATTY ACIDS) 1 by mouth once daily  [employer] PRENATAL MULTI+DHA CAPSULE (PRENATAL MV-MIN-FE FUM-FA-DHA CAPS)   CALCIUM CARBONATE-VITAMIN D 600-400 MG-UNIT ORAL TABLET (CALCIUM CARBONATE-VITAMIN D) Take one tablet by mouth daily  ONETOUCH VERIO IN VITRO STRIP (GLUCOSE BLOOD) check four times a day  ONETOUCH DELICA LANCETS 33G (LANCETS) check four times a day  ONETOUCH VERIO IN VITRO STRIP (GLUCOSE BLOOD) check four times a day      Allergies:    * NO KNOWN DRUG  ALLERGIES.      Histories:    Past Medical History: adv mvt and ca++  Past Surgical History: 2013 D&C ; she has an arcuate uterus and hystersocpy  Social History: married, one son [name] - works full time at [employer] - [place]  Scientist, clinical (histocompatibility and immunogenetics) degree  Family History: Mother -14s;  atrial fibrillation - cardioverted - high cholesterol  Father - 75s HL and kidney stone  one brother, high cholesterol   2 sisters     Review of Systems   General: General: Denies fever/chills  Eyes: Denies visual change or blurring.   Ears/Nose/Throat: Denies difficulty swallowing.   Cardiovascular: Denies chest pain or pressure.   Respiratory: Denies shortness of breath.   Gastrointestinal: Denies change in bowel habits.   Musculoskeletal: Denies back pain.   Skin: Denies skin ulcers.   Neurologic: Denies parasthesias.   Psychiatric: Denies depression.   Endocrine: Denies hypoglycemia  All other pertinent systems reviewed and are negative      Vital Signs   Weight: 158 lb. (71.82 kg.)  (Weight change since last visit: 6 lbs.)  Finger Stick: 81    Height: 60.75 in. (154.31 cm.)  BMI: 30.21    Blood Pressure: 107 /75 mm Hg.  Postion: sitting  Pulse Rate: 80  Comment: right arm    Letha [place]......................November 13, 2016 8:22 AM        Physical Exam:   General: Gen:NAD ,   Skin: no rash,   Eyes: PERLA,EOMI, no lid lag/retraction/corneal injection  Neck: no LNP   Thyroid:Thyroid normal , no nodules, No LNP, no bruit   Chest: Symmetrical,    CV: S1 S2 normal, RRR. No M/g   Resp:CTA B/L   GI/Abd: BS+, No organomegaly   Foot: DP+,No LE edema    MSK: No joint enlargement  Neuro: AAOx3, No tremors   Psych: mood stable          Laboratory Results:   TSH Ultra: 1.47 (06/20/2015 9:11:00 AM)  HgbA1C: 4.8 (11/06/2016 6:42:00 AM)  Glucose: 81 (11/06/2016 6:42:00 AM)  Fasting Blood sugar: 81 (11/06/2016 6:42:00 AM)  Last fingerstick: 114 (04/30/2016 12:54:07 PM)  Sodium: 142 (02/15/2015 9:08:00 AM)  Potassium: 3.9 (02/15/2015 9:08:00  AM)  Chloride: 103 (02/15/2015 9:08:00 AM)  Bicarbonate 24 (02/15/2015 9:08:00 AM)  BUN: 12 (02/15/2015 9:08:00 AM)  Creatinine: 0.8 (02/15/2015 9:08:00 AM)  Serum Calcium: 9.4 (02/15/2015 9:08:00 AM)  Cholesterol: 264 (06/20/2015 9:11:00 AM)  LDL: 195 (06/20/2015 9:11:00 AM)  HDL: 43 (06/20/2015 9:11:00 AM)  CRP: 18.5 mg/L (06/20/2015 9:11:00 AM)  Triglyceride: 129 (06/20/2015 9:11:00 AM)    Assessment & Plan:  36 y/o F G3P2 15M post parturm with PMH of PCOS,HL here for f/u of h/o GDM    Gestational diabetes: A1c is 4.9%,OGTT nl,breast feeding      PCO S: Currently off of metformin .  Metformin will be reinitiated after she stops breast-feeding.Teso <40 discussed plant based diet,resources given      RTC 1 yr w/labs    Thank you for involving me in the care of your patient.    Kind regards,    Rosina Lowenstein, MD    Copy of note sent to referring provider  Note generated using dictation software and errors may occur        Patient's Instructions:   Disposition: 1 year w/labs

## 2016-11-13 NOTE — Progress Notes (Signed)
Receipt of: PCOS, h/o GDM    The following were sent to 'Wendall Mola' at [email or web address] on 11/13/2016 1:15:24 PM:     - Secure message created from ACM template     - Attachment created from ACM template

## 2017-07-22 ENCOUNTER — Ambulatory Visit

## 2017-07-23 ENCOUNTER — Ambulatory Visit

## 2017-07-23 NOTE — Progress Notes (Signed)
Immunization History:  Historical Source: Historical information - from other provider   Influenza: Influenza - Unspecified Formulation  #1 Historical (07/22/2017)

## 2017-07-29 ENCOUNTER — Ambulatory Visit

## 2017-07-30 ENCOUNTER — Ambulatory Visit

## 2017-07-30 ENCOUNTER — Ambulatory Visit: Admitting: Obstetrics & Gynecology

## 2017-07-30 NOTE — Progress Notes (Signed)
GYN Annual Visit       Patient's PCP: Ellan Lambert MD    Vital Signs   Weight: 148 lb. (67.27 kg.)  Height: 60.75 in. (154.31 cm.)    Body Mass Index: 28.30  Body Surface Area (m2): 1.66    LMP: 07/02/2017      Blood Pressure #1: 112 / 76 mm Hg (right arm)        Patient in pain? N  No Translator Needed   Jerry Caras.....................Marland KitchenNovember 15, 2018 10:18 AM      History of Present Illness:    Chief Complaint: Annual exam  HPI: adv vit d 1000IU  PCP: Dr. Ernest Haber in [place]  myriad faminly history screening done on 07/30/17  plan: she does not meet current guidelines for this test. She declined this test today.     07/30/17  [id number]  Other c/o:  still breast feeding  LMP: 07/02/17( only had 2 while on breast feeding  Last colonoscopy  Last BDT  Last mammogram  Last pap 10/31/15 11/01/14 NILM, HPV Neg  Any history of abnormal pap  in 2002  Marital status: Married  Lives: w/ husband and  2 sons   Servando Salina active               current partner:   50 y  Acupuncturist control:   HPV vaccine:  Y  Medications:  PNV, DHA, Calcium 600mg - Vit D 400iu once daily.   Perfoms Self Breast Exam: Yes  Given instructions: No  GYN History:   Patient's Age: 36 Years Old  Gravida: 3  Para: 2  NSVD:  2  Spont. Ab: 1  Living Children: 2  Menarche (yrs):  7  Length of period: 3-4  LMP: 07/02/2017    Sexual Health Sexually Active? Yes  Satisfied with Sex? Yes  Partners Female  Multiple Partners No  History of STD's? yes  Desire Pregnancy? 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)    Current Medications - Reviewed Today  TRIAMCINOLONE ACETONIDE  0.1 % EXTERNAL CREAM (TRIAMCINOLONE ACETONIDE) apply two times daily as needed to  rash - apply sparingly; Route: EXTERNAL  LORATADINE 10 MG ORAL TABLET (LORATADINE) take one by mouth daily as needed for allergies; Route: ORAL  METFORMIN HCL 500 MG ORAL TABLET (METFORMIN HCL) take one by mouth twice daily for PCO; Route: ORAL  FISH OIL 1000 MG ORAL CAPSULE (OMEGA-3 FATTY ACIDS) 1 by mouth once daily; Route: ORAL  CVS PRENATAL MULTI+DHA CAPSULE (PRENATAL MV-MIN-FE FUM-FA-DHA CAPS)   CALCIUM CARBONATE-VITAMIN D 600-400 MG-UNIT ORAL TABLET (CALCIUM CARBONATE-VITAMIN D) Take one tablet by mouth daily; Route: ORAL  ONETOUCH VERIO IN VITRO STRIP (GLUCOSE BLOOD) check four times a day; Route: IN VITRO  ONETOUCH DELICA LANCETS 33G (LANCETS) check four times a day  ONETOUCH VERIO IN VITRO STRIP (GLUCOSE BLOOD) check four times a day; Route: IN VITRO    Current Allergies - Reviewed Today  * NO KNOWN DRUG  ALLERGIES.        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 ; she has an arcuate uterus  and hystersocpy    Family History  Mother -47s;  atrial fibrillation - cardioverted - high cholesterol  Father - 42s HL and kidney stone  one brother, high cholesterol   2 sisters     Social History  married, one son [name] - works full time at [employer] - [place]  Scientist, clinical (histocompatibility and immunogenetics) degree    Risk Factors  Tobacco Use:  never smoked  Passive smoke exposure: No  Alcohol Use:  no  Substance Abuse:  no  Caffeine (drinks/day):  1  Sun exposure:  rarely  Exercise (times/week):  yes  Seatbelt use (%):  100  Exercise Comments: walking    Done  Family Medical History    -- Negative history of Colon Cancer   -- Negative history of Breast Cancer    -- Negative history of Other Cancer   -- Negative history of Diabetes   -- Positive history of Hyperlipidemia   -- Negative history of Hypertension   -- Positive history of CAD   --  Negative history of Stroke     Times per week: yes  Exercise Comments:  walking      Hip Replacement? No  Knee Replacement? No  Pacemaker? No  Heart Murmur? No  Antibiotic prior to dental work? No    Violence Screening   Do you feel safe in your current relationship? Yes  Is a partner from a previous relationship making you feel unsafe now? No  Have you ever been hit, kicked, puched or otherwise hurt by someone? No  Have you ever been forced to have sex when you don't want to? No  Trauma/Violence Hx   Partner present, please ask at later date        Past Pregnancy History      Gravida:  3     Term Births:  2     Living Children: 2     Para:   2     Aborta:  1     Spont. Ab:  1    Pregnancy # 1     Delivery date:   2013     Delivery type:   SAB    Pregnancy # 2     Delivery date:   08/29/2013     Weeks Gestation: 40 1/2      Delivery type:   NSVD     Delivery location:   [place]     Infant Sex:  Female     Birth weight:  7-1     Comments:  IUI, IOL for AFP  she had prog supp    Pregnancy # 3     Delivery date:   06/08/2016     Weeks Gestation: 39     Preterm labor:   no     Delivery type:   NSVD     Anesthesia type:   epidural     Delivery location:   [facility]     Infant Sex:  Female     Birth weight:  6-12    All other pertinent systems were reviewed and are negative       Physical Exam     Constitutional: Alert, no acute distress, well hydrated, well developed, well nourished, appropriate dress,non-ill appearing.   Neck: supple, no adenopathy, no masses, thyroid normal size, no thyroid tenderness or nodules,.   Breasts: no asymmetry, no skin changes, no nipple discharge,no masses,no tenderness.   Cardiac: RRR, no murmurs, no gallops.  Pulmonary: No respiratory distress, no accessory muscle use, clear to auscultation.   Abdomen: nondistended, nontender, no guarding, normal BS, no hepatosplenomegaly, no hernias, no CVA tenderness bilaterally.   Vulva: Normal appearance, normal hair distribution,  no lesions or masses.   Urethra: Normal, no masses, non-tender, no discharge.   Vagina: Normal, rugated, physiologic discharge, no lesions, no masses, adequate pelvic support.   Cervix: Normal, no motion tenderness, no lesions.   Uterus: smooth, mobile, non-tender, adequate support, firm, no prolapse, average size.   Adnexa: Normal, no masses, mobile, nontender.   Rectum: EXTERNAL HEMORRHOIDS.   Pap done: Yes  HPV Done: No  Chlamydia culture done: No  GC culture done: No  Wet [place]: No      Impression and Recommendations:    PROBLEM  #1:  ROUTINE GYNECOLOGICAL EXAMINATION - Comment Only    The pateint was counseled regarding regular monthly self-examinations of the breasts, regular sustained exercise for @ least 30 minutes3-4 times per week. routine screening interval for mammogra, prevention of osteoporosis, importance of regular PAP smears, regular use of lap and shoulder seat belts.  She declined STD testind(gen prob, vag cx and blood tests)    Orders: PAP W/REF TO HPV DNA;  GYN Est, (18-39);  Pap Smear (reimbursed)          Patient Instructions:  1)  she will call if she desires ocp once she is done with breast feeding    Orders:  Added new Service order of Patient Encounter ([id number]) - Signed  Added new Test order of PAP W/REF TO HPV DNA (230) - Signed  Added new Service order of GYN Est, (18-39) ([id number]) - Signed  Added new Service order of Pap Smear (reimbursed) ([id number]) - Signed

## 2017-07-30 NOTE — Progress Notes (Signed)
Receipt of: GYN Annual Exam    The following were sent to 'Paige [name]' at [facility] .com on 08/02/2017 4:25:06 PM:     - Secure message created from ACM template     - Attachment created from ACM template

## 2017-08-02 ENCOUNTER — Ambulatory Visit: Admitting: Obstetrics & Gynecology

## 2017-10-28 ENCOUNTER — Ambulatory Visit

## 2017-10-28 ENCOUNTER — Ambulatory Visit: Admitting: Internal Medicine

## 2017-10-28 LAB — HX LIPID PANEL FASTING
HX CHOLESTEROL (LIPR): 221 mg/dL — ABNORMAL HIGH (ref <=200)
HX HDL CHOLESTEROL: 44 mg/dL (ref >=40)
HX LDL CHOLESTEROL: 150 mg/dL — ABNORMAL HIGH (ref <=130)
HX TRIGLYCERIDES: 133 mg/dL (ref <=150)

## 2017-10-28 LAB — HX GLYCOHEMOGLOBIN
HX ESTIMATED AVERAGE GLUCOSE: 103 mg/dL
HX HEMOGLOBIN A1C: 5.2 % (ref <=5.6)

## 2017-10-30 LAB — HX SENSITIVE TSH W/O REFLEX: HX SENSITIVE TSH W/O REFLEX: 1.07 u[IU]/mL (ref 0.358–3.74)

## 2017-11-10 ENCOUNTER — Ambulatory Visit

## 2017-11-10 NOTE — Telephone Encounter (Signed)
Phone Note -     Patient    Routine    Call back at Ph1 [phone]  Initial call taken by: Deno Lunger,  November 10, 2017 11:25 AM  Actual Caller: Patient  Call For: Jae Dire  Initial Details of Call:  Patient is coming for Physical on 3/21. Was hoping to have labs done before hand. Complains of fatigue; questioning iron. Please fax to [place] lab            Problems:  Added new problem of FATIGUE (ICD-780.79) (UJW11-B14.78)  Orders:  Added new Test order of CBC -CBC Only (No Diff)** (CBCO) - Signed  Added new Test order of Total Iron Binding Count (FETIBC) - Signed  Added new Test order of FER -Ferritin** (FER) - Signed

## 2017-11-11 ENCOUNTER — Ambulatory Visit

## 2017-11-11 ENCOUNTER — Ambulatory Visit: Admitting: Nurse Practitioner

## 2017-11-11 LAB — HX  COMPLETE BLOOD COUNT
HX HEMATOCRIT: 43.9 % (ref 36.0–47.0)
HX HEMOGLOBIN: 14.3 g/dL (ref 11.8–16.0)
HX MEAN CORP.HEMO.CONC.: 32.6 g/dL (ref 31.0–37.0)
HX MEAN CORPUSCULAR HEMOGLOBIN: 30.2 pg (ref 26.0–34.0)
HX MEAN CORPUSCULAR VOLUME: 92.6 fL (ref 80.0–100.0)
HX MEAN PLATELET VOLUME: 10.3 fL (ref 9.4–12.4)
HX NUCLEATED RBC %: 0 % (ref 0.0–0.0)
HX PLATELET COUNT: 273 10*3/uL (ref 150.0–400.0)
HX RED BLOOD COUNT: 4.74 10*6/uL (ref 3.9–5.2)
HX RED CELL DISTRIBUTION WIDTH SD: 42.6 fL (ref 35.0–51.0)
HX WHITE BLOOD COUNT: 11 10*3/uL (ref 3.7–11.2)

## 2017-11-11 LAB — HX TOTAL IRON BINDING CAPACITY
HX % SATURATION: 24 % (ref 10.0–50.0)
HX IRON: 76 ug/dL (ref 30.0–160.0)
HX TOTAL IBC: 318 ug/dL (ref 250.0–450.0)

## 2017-11-11 LAB — HX FERRITIN: HX FERRITIN: 58 ng/mL (ref 11.0–307.0)

## 2017-11-12 ENCOUNTER — Ambulatory Visit

## 2017-11-19 ENCOUNTER — Ambulatory Visit: Admitting: Internal Medicine

## 2017-11-19 ENCOUNTER — Ambulatory Visit

## 2017-11-19 NOTE — Progress Notes (Signed)
Receipt of: h/o GDM, PCOS    The following were sent to 'Wendall Mola' at [facility] .com on 11/19/2017 9:45:27 AM:     - Secure message created from ACM template     - Attachment created from ACM template

## 2017-11-19 NOTE — Progress Notes (Signed)
ENDOCRINOLOGY ENCOUNTER    PCP: Ellan Lambert MD  HPI: 37 y/o F G3P2 65M post parturm with PMH of PCOS,HL here for f/u of h/o GDM    10-2017:Her today with her 72-month-old, still breast-feeding, periods are regular, no acne or hirsutism.  Has gained 13 pounds but not sleeping or eating well.  A1c is normal, LDL has dropped 40 points which is the lowest ever for her.  Hypercholesterolemia runs in the family.  She is trying more plant-based diet    2.39 y/o boy-7lb 1 ounce, was born in [place], no GDM then but was on metformin until 14 weeks and was on metformin even in this pregnancy until 14 weeks and was stopped by Dr Dorna Bloom    65M boy- 6lb 12 ounces-GDM, diet controlled    OGTT 10-2016 nl,a1c 4.9,testo <40    PCOS Diagnosed 5 years ago based on oligomenorrhea, no acne/hirsuitism-clomiphene/IUI second preg first pregnancy she lost the bay at 9 weeks,this preg-no clomiphene but was on metformin. 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)  ROUTINE GYNECOLOGICAL EXAMINATION (ICD-V72.31) (ICD10-Z01.419)      Medication List:   TRIAMCINOLONE ACETONIDE 0.1 % EXTERNAL CREAM (TRIAMCINOLONE ACETONIDE) apply two times daily as needed to  rash - apply sparingly; Route: EXTERNAL  LORATADINE 10 MG ORAL TABLET (LORATADINE) take one by mouth daily as needed for allergies; Route: ORAL  METFORMIN HCL 500 MG ORAL TABLET (METFORMIN HCL) take one by mouth twice daily for PCO; Route: ORAL  FISH OIL 1000 MG ORAL CAPSULE (OMEGA-3 FATTY ACIDS) 1 by mouth once daily; Route: ORAL  CVS PRENATAL MULTI+DHA CAPSULE (PRENATAL  MV-MIN-FE FUM-FA-DHA CAPS)   CALCIUM CARBONATE-VITAMIN D 600-400 MG-UNIT ORAL TABLET (CALCIUM CARBONATE-VITAMIN D) Take one tablet by mouth daily; Route: ORAL  ONETOUCH VERIO IN VITRO STRIP (GLUCOSE BLOOD) check four times a day; Route: IN VITRO  ONETOUCH DELICA LANCETS 33G (LANCETS) check four times a day  ONETOUCH VERIO IN VITRO STRIP (GLUCOSE BLOOD) check four times a day; Route: IN VITRO      Allergies:    * NO KNOWN DRUG  ALLERGIES.      Histories:   Past Medical History: adv mvt and ca++  Past Surgical History: 2013 D&C ; she has an arcuate uterus and hystersocpy  Social History: married, one son [name] - works full time at [employer] - [place]  Scientist, clinical (histocompatibility and immunogenetics) degree  Family History: Mother -33s;  atrial fibrillation - cardioverted - high cholesterol  Father - 15s HL and kidney stone  one brother, high cholesterol   2 sisters     Review of Systems   General: General: Denies fever/chills  Eyes: Denies visual change or blurring.   Ears/Nose/Throat: Denies difficulty swallowing.   Cardiovascular: Denies chest pain or pressure.   Respiratory: Denies shortness of breath.   Gastrointestinal: Denies change in bowel habits.   Musculoskeletal: Denies back pain.   Skin: Denies skin ulcers.   Neurologic: Denies parasthesias.   Psychiatric: Denies depression.   Endocrine: Denies hypoglycemia  All other pertinent systems reviewed and are negative      Vital Signs   Weight: 161 lb. (73.18 kg.)  (Weight change since  last visit: 13 lbs.)    Height: 60.75 in. (154.31 cm.)  BMI: 30.78    Blood Pressure: 124 /72 mm Hg.  Postion: sitting  Pulse Rate: 84  Comment: right arm    Paige [name]......................November 19, 2017 8:30 AM        Physical Exam:   General: Gen:NAD ,   Skin: no rash,   Eyes: PERLA,EOMI, no lid lag/retraction/corneal injection   Neck: no LNP   Thyroid:Thyroid normal , no nodules, No LNP, no bruit   Chest: Symmetrical,    CV: S1 S2 normal, RRR. No M/g   Resp:CTA B/L   GI/Abd:  BS+, No organomegaly   Foot: DP+,No LE edema    MSK: No joint enlargement  Neuro: AAOx3, No tremors   Psych: mood stable          Laboratory Results:   TSH: 1.070 MCLU/ML (10/28/2017 7:30:00 AM)  TSH Ultra: 1.47 (06/20/2015 9:11:00 AM)  Total Prot: 7.1 (02/15/2015 9:08:00 AM)  Albumin: 4.4 (02/15/2015 9:08:00 AM)  AST: 18 (02/15/2015 9:08:00 AM)  Alt: 15 (02/15/2015 9:08:00 AM)  Alkaline Phosphatase: 65 (02/15/2015 9:08:00 AM)  HBSAG Non-reactive (10/31/2015 4:00:00 PM)  Hep C ab: Non-reactive (10/31/2015 4:00:00 PM)  HgbA1C: 5.2 (10/28/2017 7:30:00 AM)  Glucose: 81 (11/06/2016 6:42:00 AM)  Fasting Blood sugar: 81 (11/06/2016 6:42:00 AM)  Last fingerstick: 81 (11/13/2016 8:22:05 AM)  Sodium: 142 (02/15/2015 9:08:00 AM)  Potassium: 3.9 (02/15/2015 9:08:00 AM)  Chloride: 103 (02/15/2015 9:08:00 AM)  Bicarbonate 24 (02/15/2015 9:08:00 AM)  BUN: 12 (02/15/2015 9:08:00 AM)  Creatinine: 0.8 (02/15/2015 9:08:00 AM)  Serum Calcium: 9.4 (02/15/2015 9:08:00 AM)  Cholesterol: 221 (10/28/2017 7:30:00 AM)  LDL: 150 (10/28/2017 7:30:00 AM)  HDL: 44 (10/28/2017 7:30:00 AM)  CRP: 18.5 mg/L (06/20/2015 9:11:00 AM)  Triglyceride: 133 (10/28/2017 7:30:00 AM)    Assessment & Plan:  37 y/o F G3P2 76M post parturm with PMH of PCOS,HL here for f/u of h/o GDM    Gestational diabetes: A1c is 5.2%,OGTT nl,breast feeding    PCO S: Currently off of metformin .  Metformin will be reinitiated after she stops breast-feeding.Teso <40 discussed plant based diet,resources given ,This has benefited her, menses are regular, LDL has dropped 40 points, strong family history of hypercholesterolemia.  Currently not on any meds.    RTC 61m w/labs    Thank you for involving me in the care of your patient.    Kind regards,    Paige Lowenstein, MD    Copy of note sent to referring provider  Note generated using dictation software and errors may occur        Patient's Instructions:   Disposition: RTC 22m w/labs

## 2017-12-03 ENCOUNTER — Ambulatory Visit: Admitting: Nurse Practitioner

## 2017-12-03 ENCOUNTER — Ambulatory Visit

## 2017-12-03 NOTE — Progress Notes (Signed)
Receipt of:    The following were sent to 'Wendall Mola' at [facility] .com on 12/03/2017 12:04:21 PM:     - Secure message created from ACM template     - Attachment created from ACM template

## 2017-12-03 NOTE — Progress Notes (Signed)
Visit Type:  Annual Physical  Primary Provider:  Ellan Lambert MD    CC:  complete exam.    History of Present Illness:     This is a 37 Years Old  Female who presents today for complete exam.    Feeling ok, more tired than normal.  Although some may be due to having two small children and working full time.  No other complaints. She also thinks its a little better lately.      Current Problems- Reviewed during today's visit  MURMUR, CARDIAC, UNDIAGNOSED (R01.1)  FATIGUE (R53.83)  H/O GESTATIONAL DIABETES MELLITUS (E11.9)  HYPERLIPIDEMIA (E78.5)  ECZEMA (L30.9)  POLYCYSTIC OVARIAN SYNDROME (E28.2)  ROUTINE GYNECOLOGICAL EXAMINATION (Z01.419)  PREVENTIVE HEALTH CARE (Z00.00)    Current Medications  TRIAMCINOLONE ACETONIDE 0.1 % EXTERNAL CREAM: apply two times daily as needed to  rash - apply sparingly  LORATADINE 10 MG ORAL TABLET: take one by mouth daily as needed for allergies  METFORMIN HCL 500 MG ORAL TABLET: take one by mouth twice daily for PCO  FISH OIL 1000 MG ORAL CAPSULE (OMEGA-3 FATTY ACIDS): 1 by mouth once daily  CVS PRENATAL MULTI+DHA CAPSULE (PRENATAL MV-MIN-FE FUM-FA-DHA CAPS):   CALCIUM CARBONATE-VITAMIN D 600-400 MG-UNIT ORAL TABLET: Take one tablet by mouth daily  ONETOUCH VERIO IN VITRO STRIP (GLUCOSE BLOOD): check four times a day  ONETOUCH DELICA LANCETS 33G (LANCETS): check four times a day  ONETOUCH VERIO IN VITRO STRIP (GLUCOSE BLOOD): check four times a day    Current Allergies  * NO KNOWN DRUG  ALLERGIES (Mild)  Surgical History  2013 D&C ; she has an arcuate uterus and hystersocpy  Family History  Mother -68s;  atrial fibrillation - cardioverted - high cholesterol  Father - 15s High chol and kidney stone  one brother, high cholesterol   2 sisters -older sister healthy  younger sister has stomach issues  Social History  married, twos sons [name] age 65, [name] age 67 - 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 doe sno snore, when she sleeps she does feel fine  Eyes: Denies visual change or blurring, eye pain.   Ears/Nose/Throat: Denies earache, decreased hearing, difficulty swallowing.   Cardiovascular: Denies chest pain or pressure, palpitations, shortness of breath.   Respiratory: Denies dry cough, productive cough, shortness of breath, wheezing.   Gastrointestinal: Denies acid indigestion, nausea, vomiting, diarrhea, abdominal pain, change in bowel habits, constipation, mucous or blood in stools.   Urologic: Denies dysuria, hematuria, frequency, urgency, flank pain, stress or urge incontinence.   GYN: Denies breast pain, breast mass, breast discharge, irregular menses, vaginal discharge, pelvic pain.   Musculoskeletal: Denies muscle cramps or aches, muscle weakness, morning stiffness, joint pain, joint swelling.   Skin: Denies dry skin, rash, skin ulcers, suspicious lesions.   Neurologic: Denies memory loss, parasthesias, dizziness, headaches, transient weakness.   Psychiatric: Denies anxiety, depression, insomnia.   Endocrine: Denies skin changes, hair  loss, weight gain, weight loss, cold intolerance, heat intolerance, polyuria, polydipsia, loss of libido.   Heme/Lymphatic: Denies easy bruising, fatigue, unusual bleeding, fevers, night sweats.     Vital Signs     Patient: 37 Years Old Female  Height:  60.75 in.  Weight: 157.20 lbs      Wt Chg: -3.80 since 11/19/2017  BMI:  30.06        30.78 on 11/19/2017  BP:  112/68     124/72 on 11/19/2017   Pulse:  76         Resp:  14    PHQ 2    Over the last 2 weeks, how often have you been bothered by any of the following  problems?  1. Little interest or pleasure in doing things:  0   - Not at all  2. Feeling down, depressed, or hopeless:  0   - Not at all        Physical Exam    General:      well developed, well nourished, in no acute distress.    Head:      normocephalic and atraumatic.    Eyes:      PERRL/EOM intact, conjunctiva and sclera clear with out nystagmus.    Ears:      TM's intact and clear with normal canals with grossly normal hearing.    Mouth:      no deformity or lesions with good dentition.    Neck:      no masses, thyromegaly, or abnormal cervical nodes.    Chest Wall:      no deformities or breast masses noted.    Breasts:      no masses, adenopathy or nipple discharge.    Lungs:      clear bilaterally to auscultation.    Heart:      possible Grade one systolic murmur  Abdomen:       normal bowel sounds; no hepatosplenomegaly no ventral,umbilical hernias or masses noted.    Skin:      intact without lesions or rashes.    Cervical Nodes:      no significant adenopathy.    Psych:      alert and cooperative; normal mood and affect; normal attention span and concentration.           Assessment and Plan:      ~ FATIGUE (R53.83) :    could be seasonal or related to busy life  will just monitor as labs are fine and she is fine with that   ~ MURMUR, CARDIAC, UNDIAGNOSED (R01.1) :    could be very minor but will get echo   ~ HYPERLIPIDEMIA (E78.5) :    LDL is a little high,diet, exercise no need for meds at present   ~ PREVENTIVE HEALTH CARE (Z00.00) :    up to date on screenings  one year    Med Compliance and SE's: Pt is compliant with meds with no side effects             Patient Instructions    echo Jae Dire will call you with results  one year for annual exam

## 2017-12-16 ENCOUNTER — Ambulatory Visit

## 2017-12-16 ENCOUNTER — Ambulatory Visit: Admitting: Nurse Practitioner

## 2017-12-17 ENCOUNTER — Ambulatory Visit: Admitting: Nurse Practitioner

## 2017-12-17 NOTE — Progress Notes (Signed)
Problems:  Removed problem of MURMUR, CARDIAC, UNDIAGNOSED (ICD-785.2) (ICD10-R01.1)

## 2017-12-20 ENCOUNTER — Ambulatory Visit

## 2018-02-10 ENCOUNTER — Ambulatory Visit

## 2018-02-10 NOTE — Telephone Encounter (Signed)
Phone Note -     Outgoing Call    **Informational purposes only**    Initial call taken by: Majel Homer Belle,  Feb 10, 2018 9:09 AM  Summary of Call: called pt to find out about diabetes eye exam, pt only had gestantial diabetes and have been to endo twice and has not come back.

## 2018-05-12 ENCOUNTER — Ambulatory Visit

## 2018-05-12 ENCOUNTER — Ambulatory Visit: Admitting: Internal Medicine

## 2018-05-12 LAB — HX GLYCOHEMOGLOBIN
HX ESTIMATED AVERAGE GLUCOSE: 103 mg/dL
HX HEMOGLOBIN A1C: 5.2 % (ref <=5.6)

## 2018-05-12 LAB — HX TSH WITH REFLEX: HX TSH WITH REFLEX: 0.913 u[IU]/mL (ref 0.358–3.74)

## 2018-05-12 LAB — HX BASIC METABOLIC PANEL
HX ANION GAP: 6 (ref 3.0–11.0)
HX BICARBONATE: 28 mmol/L (ref 21.0–32.0)
HX BUN: 12 mg/dL (ref 6.0–20.0)
HX CALCIUM: 9.5 mg/dL (ref 8.5–10.5)
HX CHLORIDE: 105 mmol/L (ref 98.0–110.0)
HX CREATININE: 0.67 mg/dL (ref 0.55–1.3)
HX GLOMERULAR FR AFRICAN AMERICAN: >90
HX GLOMERULAR FR NON AFRICAN AMER: >90
HX GLUCOSE: 82 mg/dL (ref 70.0–110.0)
HX POTASSIUM: 3.9 mmol/L (ref 3.6–5.2)
HX SODIUM: 139 mmol/L (ref 136.0–146.0)

## 2018-05-12 LAB — HX LIPID PANEL FASTING
HX CHOLESTEROL (LIPR): 271 mg/dL — ABNORMAL HIGH (ref <=200)
HX HDL CHOLESTEROL: 48 mg/dL (ref >=40)
HX LDL CHOLESTEROL: 204 mg/dL — ABNORMAL HIGH (ref <=130)
HX TRIGLYCERIDES: 97 mg/dL (ref <=150)

## 2018-05-15 LAB — HX TESTOSTERONE,TOTAL,BIO,FREE,S
HX TESTO ALBUMIN: 4.5 g/dL (ref 3.6–5.1)
HX TESTO SEX HORMONE BINDING GLOB: 39 nmol/L (ref 17.0–124.0)
HX TESTOSTERONE TOTALSD: 33 ng/dL (ref 2.0–45.0)
HX TESTOSTERONE, BIOAVAILABLE, S: 6.9 ng/dL (ref 0.5–8.5)
HX TESTOSTERONE,FREE: 3.4 pg/mL (ref 0.2–5.0)

## 2018-05-17 ENCOUNTER — Ambulatory Visit

## 2018-05-25 ENCOUNTER — Ambulatory Visit

## 2018-05-25 ENCOUNTER — Ambulatory Visit: Admitting: Internal Medicine

## 2018-05-25 NOTE — Progress Notes (Signed)
ENDOCRINOLOGY ENCOUNTER    PCP: Ellan Lambert MD  HPI: 37 y/o F G3P2 31M post parturm with PMH of PCOS,HL here for f/u of h/o GDM    05-2018: Her today with her 61-month-old, still breast-feeding, periods are regular, no acne or hirsutism.    A1c is normal, LDLup again.  Hypercholesterolemia runs in the family.  She is trying more plant-based diet,has HNTD book    2.39 y/o boy-7lb 1 ounce, was born in [place], no GDM then but was on metformin until 14 weeks and was on metformin even in this pregnancy until 14 weeks and was stopped by Dr Dorna Bloom    29M boy- 6lb 12 ounces-GDM, diet controlled    OGTT 10-2016 nl,a1c 4.9,testo <40    PCOS Diagnosed 5 years ago based on oligomenorrhea, no acne/hirsuitism-clomiphene/IUI second preg first pregnancy she lost the bay at 9 weeks,this preg-no clomiphene but was on metformin. 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)  ROUTINE GYNECOLOGICAL EXAMINATION (ICD-V72.31) (ICD10-Z01.419)      Medication List:   TRIAMCINOLONE ACETONIDE 0.1 % EXTERNAL CREAM (TRIAMCINOLONE ACETONIDE) apply two times daily as needed to  rash - apply sparingly; Route: EXTERNAL  LORATADINE 10 MG ORAL TABLET (LORATADINE) take one by mouth daily as needed for allergies; Route: ORAL  METFORMIN HCL 500 MG ORAL TABLET (METFORMIN HCL) take one by mouth twice daily for PCO; Route: ORAL  FISH OIL 1000 MG ORAL CAPSULE (OMEGA-3 FATTY ACIDS) 1 by mouth once daily; Route: ORAL  CVS PRENATAL MULTI+DHA CAPSULE (PRENATAL MV-MIN-FE FUM-FA-DHA CAPS)   CALCIUM CARBONATE-VITAMIN D 600-400 MG-UNIT ORAL TABLET (CALCIUM CARBONATE-VITAMIN D) Take one tablet by mouth daily; Route:  ORAL  ONETOUCH VERIO IN VITRO STRIP (GLUCOSE BLOOD) check four times a day; Route: IN VITRO  ONETOUCH DELICA LANCETS 33G (LANCETS) check four times a day  ONETOUCH VERIO IN VITRO STRIP (GLUCOSE BLOOD) check four times a day; Route: IN VITRO      Allergies:    * NO KNOWN DRUG  ALLERGIES.      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  Family History: Mother -64s;  atrial fibrillation - cardioverted - high cholesterol  Father - 78s High chol and kidney stone  one brother, high cholesterol   2 sisters -older sister healthy  younger sister has stomach issues    Review of Systems   General: General: Denies fever/chills  Eyes: Denies visual change or blurring.   Ears/Nose/Throat: Denies difficulty swallowing.   Cardiovascular: Denies chest pain or pressure.   Respiratory: Denies shortness of breath.   Gastrointestinal: Denies change in bowel habits.   Musculoskeletal: Denies back pain.   Skin: Denies skin ulcers.   Neurologic: Denies parasthesias.   Psychiatric: Denies depression.   Endocrine: Denies hypoglycemia  All other pertinent systems reviewed and are negative      Vital Signs   Weight: 157 lb. (71.36 kg.)  (Weight change  since last visit: -0.20 lbs.)  Finger Stick: 100    Height: 60.75 in. (154.31 cm.)  BMI: 30.02    Blood Pressure: 118 /80 mm Hg.  Postion: sitting  Pulse Rate: 90  Comment: 157left arm    Paige [name] [place].....................Marland KitchenSeptember 10, 2019 7:32 AM        Physical Exam:   General: Gen:NAD ,   Skin: no rash,   Eyes: PERLA,EOMI, no lid lag/retraction/corneal injection   Neck: no LNP   Thyroid:Thyroid normal , no nodules, No LNP, no bruit   Chest: Symmetrical,    CV: S1 S2 normal, RRR. No M/g   Resp:CTA B/L    GI/Abd: BS+, No organomegaly   Foot: DP+,No LE edema    MSK: No joint enlargement  Neuro: AAOx3, No tremors   Psych: mood stable          Laboratory Results:   TSH: 1.070 MCLU/ML (10/28/2017 7:30:00 AM)  TSH Ultra: 0.913 MCLU/ML (05/12/2018 8:38:00 AM)  Total Prot: 7.1 (02/15/2015 9:08:00 AM)  Albumin: 4.4 (02/15/2015 9:08:00 AM)  AST: 18 (02/15/2015 9:08:00 AM)  Alt: 15 (02/15/2015 9:08:00 AM)  Alkaline Phosphatase: 65 (02/15/2015 9:08:00 AM)  HBSAG Non-reactive (10/31/2015 4:00:00 PM)  Hep C ab: Non-reactive (10/31/2015 4:00:00 PM)  HgbA1C: 5.2 (05/12/2018 8:38:00 AM)  Glucose: 82 (05/12/2018 8:38:00 AM)  Fasting Blood sugar: 81 (11/06/2016 6:42:00 AM)  Last fingerstick: 81 (11/13/2016 8:22:05 AM)  Sodium: 139 (05/12/2018 8:38:00 AM)  Potassium: 3.9 (05/12/2018 8:38:00 AM)  Chloride: 105 (05/12/2018 8:38:00 AM)  Bicarbonate 28 (05/12/2018 8:38:00 AM)  BUN: 12 (05/12/2018 8:38:00 AM)  Creatinine: 0.67 (05/12/2018 8:38:00 AM)  Serum Calcium: 9.5 (05/12/2018 8:38:00 AM)  Cholesterol: 271 (05/12/2018 8:38:00 AM)  LDL: 204 (05/12/2018 8:38:00 AM)  HDL: 48 (05/12/2018 8:38:00 AM)  CRP: 18.5 mg/L (06/20/2015 9:11:00 AM)  Triglyceride: 97 (05/12/2018 8:38:00 AM)    Assessment & Plan:  37 y/o F G3P2 12M post parturm with PMH of PCOS,HL here for f/u of h/o GDM    Gestational diabetes: A1c is 5.2%,OGTT nl,breast feeding    PCO S: Currently off of metformin .  Metformin will be reinitiated after she stops breast-feeding.Teso <40 discussed plant based diet,resources given ,This has benefited her, menses are regular, LDL up again, breast feeding, strong family history of hypercholesterolemia.  Currently not on any meds.    RTC 50m w/labs, unless pregnant    Thank you for involving me in the care of your patient.    Kind regards,    Rosina Lowenstein, MD    Copy of note sent to referring provider  Note generated using dictation software and errors may occur        Patient's Instructions:   Disposition: RTc 68yr w/labs

## 2018-05-25 NOTE — Progress Notes (Signed)
Receipt of: PCOS    The following were sent to 'Paige [name]' at [email or web address] on 05/25/2018 8:20:12 AM:     - Secure message created from [facility] template     - Attachment created from [facility] template

## 2018-05-28 ENCOUNTER — Ambulatory Visit

## 2018-05-28 NOTE — Progress Notes (Signed)
Haynes Bast, MD and Ellan Lambert, MD   [address]   [place], [place] [address]  Office: [phone] Fax: [phone]      05/28/2018    Paige [name]  [address]  [place], [place]  [address]    Dear Ms. Sooy:      Your insurance company has notified me that your annual Diabetic Eye Exam is overdue.  If this is correct, please contact your eye physician's office and schedule an appointment as soon as possible.    If you have already had this exam, please contact my office with the date.    You are also due for a Micralalbumin Urine test.  I have enclosed a lab order for you.  Please have this test done by 08/15/2018.    Thank you.          Sincerely,  Ellan Lambert, MD

## 2018-06-02 ENCOUNTER — Ambulatory Visit

## 2018-06-02 NOTE — Telephone Encounter (Signed)
Phone Note -     Patient    **Informational purposes only**    Initial call taken by: Dorris Singh [place],  June 02, 2018 1:20 PM  Initial Details of Call:  pt called states she recieved the letter. She f/u with Dr.Tucker just had gestiational Diabetes and she has been fine since having the baby.Dr.Tucker said no need for DM eye exam

## 2018-07-15 ENCOUNTER — Ambulatory Visit: Admitting: Family Medicine

## 2018-07-15 ENCOUNTER — Ambulatory Visit

## 2018-08-14 ENCOUNTER — Ambulatory Visit

## 2018-09-27 ENCOUNTER — Ambulatory Visit

## 2018-12-07 ENCOUNTER — Ambulatory Visit

## 2018-12-09 ENCOUNTER — Ambulatory Visit

## 2018-12-30 ENCOUNTER — Ambulatory Visit: Admitting: Family Medicine

## 2018-12-30 ENCOUNTER — Ambulatory Visit

## 2018-12-31 ENCOUNTER — Ambulatory Visit

## 2018-12-31 ENCOUNTER — Ambulatory Visit: Admitting: Family Medicine

## 2018-12-31 NOTE — Progress Notes (Signed)
Primary Provider:  Ellan Lambert MD      History of Present Illness:  Telehealth visit:  patient consents to real-time interactive telehealth visit today, recognizing and accepting that there are inherent limitations due to inability to do a full physical exam, but that we will discuss diagnoses, treatment options,any further studies, and any necessary prescriptions.  Patient understands that insurance company will be billed for services, and that this may entail a copay.  Two patient identifiers used.  Patient was at home, I was at my office.  No other participants     patient  has mole base of neck left  side; has had this for yrs but recently noticed a change, "black spot in the middle of it."; no symptoms  or other concerns; no fam hx skin CA        Vital Signs     Patient: 38 Years Old Female  Height:  60.75 in.  Prev Weight:  157 lbs   BMI: 30.02 on 05/25/2018   Old BP:  118/80 on 05/25/2018    Medications and Allergies Reviewed          OBJECTIVE:  Alert, WNWD, NAD  Resp: normal, no distress  Facial movements symmetric.  Hearing grossly normal.  Neck: supple, nontender  skin : left side base of neck raised erythematous nevus, ?darker areas but overall homogenous coloring; no atypical features noted.  Pt had emailed me picture of mole as well but resolution was not great, though appeared to have homogenous coloring and regular borders.         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  Orders:   Added new Service  order of Patient Encounter ([id number]) - Signed  Added new Service order of OV Est Level III ([id number]) - Signed

## 2018-12-31 NOTE — Progress Notes (Signed)
 Receipt of: Primary Care Office Visit    The following were sent to 'Paige [name]' at [email or web address] on 12/31/2018 11:12:57 PM:     - Secure message created from ACM template     - Attachment created from ACM template

## 2019-01-12 ENCOUNTER — Ambulatory Visit

## 2019-01-24 ENCOUNTER — Ambulatory Visit

## 2019-02-18 ENCOUNTER — Ambulatory Visit

## 2019-02-23 ENCOUNTER — Ambulatory Visit

## 2019-03-02 ENCOUNTER — Ambulatory Visit: Admitting: Family Medicine

## 2019-04-06 ENCOUNTER — Ambulatory Visit

## 2019-04-06 ENCOUNTER — Ambulatory Visit: Admitting: Family Medicine

## 2019-04-06 LAB — HX LIPID PANEL FASTING
HX CHOLESTEROL (LIPR): 235 mg/dL — ABNORMAL HIGH (ref <=200)
HX HDL CHOLESTEROL: 43 mg/dL (ref >=40)
HX LDL CHOLESTEROL: 154 mg/dL — ABNORMAL HIGH (ref <=130)
HX TRIGLYCERIDES: 188 mg/dL — ABNORMAL HIGH (ref <=150)

## 2019-04-06 LAB — HX BASIC METABOLIC PANEL
HX ANION GAP: 6 (ref 3.0–11.0)
HX BICARBONATE: 25 mmol/L (ref 21.0–32.0)
HX BUN: 18 mg/dL (ref 6.0–20.0)
HX CALCIUM: 9.4 mg/dL (ref 8.5–10.5)
HX CHLORIDE: 110 mmol/L (ref 98.0–110.0)
HX CREATININE: 0.73 mg/dL (ref 0.55–1.3)
HX GLOMERULAR FR AFRICAN AMERICAN: >90
HX GLOMERULAR FR NON AFRICAN AMER: >90
HX GLUCOSE: 65 mg/dL — ABNORMAL LOW (ref 70.0–110.0)
HX POTASSIUM: 4.1 mmol/L (ref 3.6–5.2)
HX SODIUM: 141 mmol/L (ref 136.0–146.0)

## 2019-04-06 LAB — HX GLYCOHEMOGLOBIN
HX ESTIMATED AVERAGE GLUCOSE: 100 mg/dL
HX HEMOGLOBIN A1C: 5.1 % (ref <=5.6)

## 2019-04-06 LAB — EXTERNAL LIPID PANEL: LDL Cholesterol: 154 mg/dL

## 2019-04-06 NOTE — Progress Notes (Signed)
 Receipt of: Primary Care Office Visit    The following were sent to 'Pallavi Clifton' at [email or web address] on 04/06/2019 12:33:58 PM:     - Secure message created from [facility] template     - Attachment created from [facility] template

## 2019-04-06 NOTE — Progress Notes (Signed)
 Primary Provider:  Ellan Lambert MD      History of Present Illness:  38 yo woman is here for lesion on her neck and followup on her PCO.   Stopped metformin 3 years after the birth of her son.  Also has a hx of gestational diabetes.   Feeling well in general.  No serious complaints, denies chest pain, SOB, Abdominal pain, palpitations, headache.           Vital Signs     Patient: 38 Years Old Female  Height:  60.75 in.  Weight: 157 lbs        BMI:  30.02        30.02 on 05/25/2018  BP:  110/80     118/80 on 05/25/2018     Medications and Allergies Reviewed          PHYSICAL EXAM    Gen: Healthy appearing, in NAD,  HEENT: unremarkable  Chest: clear, Heart: RRR, no murmur  Abdomen: soft, non tender, skin: there is a dangling lesions       Assessment and Plan:      ~POLYCYSTIC OVARIAN SYNDROME (E28.2)   No symptoms recently and off metformin.        ~HX OF DIABETES MELLITUS, GESTATIONAL (Z86.32)   Will check A1C.         ~ECZEMA (L30.9)  Stable.        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           Problems Reviewed  Patient/Caregiver understand instructions and plan.    Medications Removed Today:   TRIAMCINOLONE ACETONIDE 0.1 % EXTERNAL CREAM (TRIAMCINOLONE ACETONIDE) apply two times daily as needed to  rash - apply sparingly; Route: EXTERNAL  METFORMIN HCL 500 MG ORAL TABLET (METFORMIN HCL) take one by mouth twice daily for PCO; Route: ORAL  FISH OIL 1000 MG ORAL CAPSULE (OMEGA-3 FATTY ACIDS) 1 by mouth once daily; Route: ORAL  CALCIUM CARBONATE-VITAMIN D 600-400 MG-UNIT ORAL TABLET (CALCIUM CARBONATE-VITAMIN D) Take one tablet by mouth daily; Route: ORAL  ONETOUCH VERIO IN VITRO STRIP (GLUCOSE BLOOD) check four times a day; Route: IN VITRO  ONETOUCH DELICA LANCETS 33G (LANCETS) check four times a day  ONETOUCH VERIO IN VITRO STRIP (GLUCOSE BLOOD) check four times a day; Route: IN  VITRO    Changes to Medication List Documented Today:   ZYRTEC ALLERGY 10 MG ORAL TABLET (CETIRIZINE HCL) 1 by mouth daily for allergies; Route: ORAL  MULTIVITAMINS ORAL CAPSULE (MULTIPLE VITAMIN) one by mouth daily for health; Route: ORAL    Patient Instructions    Eat a healthy diet, get exercise.   Check with Dr. Dorna Bloom.

## 2019-04-16 ENCOUNTER — Ambulatory Visit

## 2019-04-16 NOTE — Telephone Encounter (Signed)
 Phone Note -       Call back at Ph1 [phone]  Initial call taken by: Rance Muir,  April 16, 2019 9:10 AM  Initial Details of Call:  Patient said the mole on her neck healed and i it looks smaller but it is still there. Wants to know what she should do. She knows you wont be calling her until Monday.       Follow-up #1  Details: Left message to give this a little time, call if it is not shrinking.   May try cortaid cream.   By: Haynes Bast, M.D. ~ April 18, 2019 9:19 AM

## 2019-04-18 ENCOUNTER — Ambulatory Visit

## 2019-04-29 ENCOUNTER — Ambulatory Visit

## 2019-04-29 NOTE — Telephone Encounter (Signed)
 Phone Note -       Initial call taken by: Alvy Beal [place],  April 29, 2019 11:03 AM  Initial Details of Call:  LVM for patient regarding appointment on 9/15 at [facility]      Follow-up #1  Details: Patient called back to cancel her appointment stating that her PCP will monitor her levels from now on and that she does not need to see an endocrine specialist. Patient states that if needed, she will call to schedule an appointment.   By: Alvy Beal [place] ~ April 29, 2019 1:42 PM

## 2019-06-08 ENCOUNTER — Ambulatory Visit: Admitting: Obstetrics & Gynecology

## 2019-06-08 NOTE — Telephone Encounter (Signed)
 Internal Correspondence   Initial call taken by: Malachy Chamber MD,  June 08, 2019 11:58 AM  Type: Patient records  Summary of Call: Almira Coaster  can you put her in tomorrow @ 2:30pm for annual?  Thank you      Follow-up #1  Details: I added her in for annual  By: Gina [place] ~ June 08, 2019 7:21 PM

## 2019-06-09 ENCOUNTER — Ambulatory Visit

## 2019-06-09 ENCOUNTER — Ambulatory Visit: Admitting: Obstetrics & Gynecology

## 2019-06-09 LAB — HX VITAMIN D,25 HYDROXY: HX VITAMIN D,25 HYDROXY: 28 ng/mL — ABNORMAL LOW (ref 30.0–100.0)

## 2019-06-09 NOTE — Progress Notes (Signed)
 Receipt of: GYN Annual Exam    The following were sent to 'Paige [name]' at [email or web address] on 06/09/2019 9:41:22 PM:     - Secure message created from ACM template     - Attachment created from ACM template

## 2019-06-09 NOTE — Progress Notes (Signed)
 GYN Annual Visit    OBGYN Issues  PCP: Dr. Ernest Haber in [place]  myriad faminly history screening done on 06/09/19  plan: she does not meet current guidelines for this test. She declined this test today.     06/09/19  YW:VPXTGG  Other c/o:  done breast feeding  LMP: 06/02/19 every 26-28 days  Last mammogram will schedule for this now  Last pap 07/30/17 wnl, 10/31/15 11/01/14 NILM, HPV Neg  Any history of abnormal pap  in 2002[xxx]next pap/hpv in 2021  Marital status: Married  Lives: w/ husband and  2 sons   Servando Salina active               current partner:   40 y  Acupuncturist control: none  HPV vaccine:  Y  ALLERGIES: NKDA  Medications: WOMEN'S MVT, she works out side  all   adv pnv/dha and vit d   she was on metformin  she is done with with breast feeding.   GDM - diet control 2nd pregnancy in 2017       Patient's PCP: Ellan Lambert MD  Patient's OBGYN: Ranya Fiddler    Vital Signs   Weight: 154 lb. (70 kg.)  Height:          60.75 in. (154.31 cm.)  Body Mass Index: 29.44  BSA:  1.69    LMP: 06/02/2019      Blood Pressure #1: 107 / 75 mm Hg (right arm)  Vitals taken by:  Halina Andreas....................Marland KitchenSeptember 24, 2020 2:34 PM    Pap: Normal Date: 07/30/2017      History of Present Illness:    Chief Complaint: Annual exam  Perfoms Self Breast Exam: Yes  Given instructions: No  GYN History:   Patient's Age: 38 Years Old  Gravida: 3  Para: 2  Prev C-Section: 0  NSVD:  2  TAB:  0  Spont. Ab: 1  Ectopic Preg: 0  Living Children: 2  Mult. Births: 0  Menarche (yrs):  7  Length of period: 3-4  LMP: 06/02/2019    Sexual Health Sexually Active? Yes  Satisfied with Sex? Yes  Partners Female  Multiple Partners No  History of STD's? yes  Desire Pregnancy? 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 to [employer] degree    Risk Factors  Tobacco Use:  never smoked  Passive smoke exposure: No  Alcohol Use:  no  Substance Abuse:  no  Caffeine (drinks/day):  2-3  Sun exposure:  frequently  Exercise (times/week):  yes  Seatbelt use (%):  100  Exercise Comments: walking - AND ACTIVE AT WORK    Done  Family Medical History    -- Negative history of Colon Cancer   -- Negative history of Breast Cancer    -- Negative history of Other Cancer   -- Negative history of Diabetes   -- Positive history of Hyperlipidemia   -- Negative history of Hypertension   -- Positive history of CAD   --  Negative history of Stroke       Exercise: Yes  Times per week: yes  Exercise Comments:  walking - AND ACTIVE AT WORK      Hip Replacement? No  Knee Replacement? No  Pacemaker? No  Heart Murmur? No  Antibiotic prior to dental work? No    Violence Screening   Do you feel safe in your current relationship? Yes  Is a partner from a previous relationship making you feel unsafe now? No  Have you ever been hit, kicked, puched or otherwise hurt by someone? No  Have you ever been forced to have sex when you don't want to? No  Trauma/Violence Hx   Pt feels safe. She has never been abused nor forced to have sex.          Past Pregnancy History      Gravida:  3     Term Births:  2     Premature Births: 0     Living Children: 2     Para:   2     Mult. Births:  0     Prev C-Section: 0     Aborta:  1     Elect. Ab:  0     Spont. Ab:  1     Ectopics:  0    Pregnancy # 1     Delivery date:   2013     Delivery type:   SAB    Pregnancy # 2     Delivery date:   08/29/2013     Weeks Gestation: 40 1/2      Delivery type:   NSVD     Delivery location:   [place]     Infant Sex:  Female     Birth weight:  7-1     Comments:  IUI, IOL for AFP  she had prog supp    Pregnancy # 3     Delivery date:   06/08/2016     Weeks Gestation: 39     Preterm labor:   no     Delivery type:   NSVD     Anesthesia type:   epidural     Del[facility]   [facility]     Infant Sex:  Female     Birth weight:  6-12     Comments:  IOL with cervidil and pitocin  diet  control GDM    All other pertinent systems were reviewed and are negative       Physical Exam     Constitutional: Alert, no acute distress, well hydrated, well developed, well nourished, appropriate dress,non-ill appearing.   Neck: supple, no adenopathy, no masses, thyroid normal size, no thyroid tenderness or nodules,.   Breasts: no asymmetry, no skin changes, no nipple discharge,no masses,no tenderness.   Cardiac: RRR, no murmurs, no gallops.   Pulmonary: No respiratory distress,  no accessory muscle use, clear to auscultation.   Abdomen: nondistended, nontender, no guarding, normal BS, no hepatosplenomegaly, no hernias, no CVA tenderness bilaterally.   Vulva: Normal appearance, normal hair distribution, no lesions or masses.   Urethra: Normal, no masses, non-tender, no discharge.   Vagina: Normal, rugated, physiologic discharge, no lesions, no masses, adequate pelvic support.   Cervix: Normal, no motion tenderness, no lesions.   Uterus: smooth, mobile, non-tender, adequate support, firm, no prolapse, average size.   Adnexa: Normal, no masses, mobile, nontender.   Rectum: EXTERNAL HEMORRHOIDS.   Pap done: Yes  HPV Done: No  Chlamydia culture done: Yes  GC culture done: Yes  [place]et [place]: No      Impression and Recommendations:    PROBLEM  #1:  CERVIX, SCREENING FOR MALIGNANT NEOPLASM - Comment Only    Orders: PAP W/REF TO HPV DNA;  GYN Est, (40-64)    PROBLEM  #1:  VITAMIN A DEFICIENCY(N) - Comment Only    Orders: Vit D 25 Hydroxy    PROBLEM  #1:  ROUTINE GYNECOLOGICAL EXAMINATION - Comment Only    The pateint was counseled regarding regular monthly self-examinations of the breasts, regular sustained exercise for @ least 30 minutes3-4 times per week. routine screening interval for mammogra, prevention of osteoporosis, importance of regular PAP smears, regular use of lap and shoulder seat belts.  She declined STD testind(gen prob, vag cx and blood tests)    Orders: Mammogram-Screening **;  Vit D 25 Hydroxy;  PAP W/REF TO HPV DNA;  Pap Smear (reimbursed);  GYN Est, (40-64)    PROBLEM  #1:  POLYCYSTIC OVARIAN SYNDROME - Comment Only    discussed the need of this med for pregnancy.   since she still has period now monthly, will continue this with pregnancy till she is 12 weeks  Medications:  METFORMIN HCL 500 MG ORAL TABLET take one by mouth twice daily.           Patient Instructions:  1)  Please schedule a follow-up appointment in 1 year.  2)  adv return to office  if her period is abnormal  now.  3)  adv d/c mvt and start pnv/dha if she desires pregnancy  4)  will check her vit d level if it is normal, she does not need to be on vit d supplement since she said that she works out side all the time with adequate sun exposure    Orders:  Added new Service order of Patient Encounter ([id number]) - Signed  Added new Test order of Mammogram-Screening ** (MAMDIGSC) - Signed  Added new Test order of Vit D 25 Hydroxy (VITD) - Signed  Added new Test order of PAP W/REF TO HPV DNA (230) - Signed  Added new Service order of Pap Smear (reimbursed) ([id number]) - Signed  Added new Service order of GYN Est, (40-64) ([id number]) - Signed      Medications:  METFORMIN HCL 500 MG ORAL TABLET (METFORMIN HCL) take one by mouth twice daily  #60[Tablet]  x 6   Route:ORAL   Entered and Authorized by: Malachy Chamber MD   Signed by: Malachy Chamber MD on 06/09/2019   Method used: Electronically to      [id number] (retail)     [address] [place], [place]  [address]     Ph: [phone] or [phone]     Fax: [phone]   Note to Pharmacy: Route: ORAL;    RxID: [id number]          ]

## 2019-06-13 ENCOUNTER — Ambulatory Visit

## 2019-06-13 NOTE — Progress Notes (Signed)
 Haynes Bast, MD and Ellan Lambert, MD   [address]   [place], [place] [address]  Office: [phone] Fax: [phone]      06/13/2019    Paige [name]  [address]  [place], [place]  [address]    Dear Ms. Kopecky:    Your insurance company has notified me that your annual Diabetic Eye Exam is overdue.  If this is correct, please contact your eye physician's office and schedule an appointment as soon as possible.    If you have already had this exam, please contact my office with the date.    You are also due for a diabetic follow up with our office in October.  Please call and schedule this appointment.          Sincerely,    Mortimer Fries, MD

## 2019-06-14 ENCOUNTER — Ambulatory Visit: Admitting: Obstetrics & Gynecology

## 2019-06-15 ENCOUNTER — Ambulatory Visit

## 2019-07-06 ENCOUNTER — Ambulatory Visit

## 2019-07-08 ENCOUNTER — Ambulatory Visit

## 2019-07-18 ENCOUNTER — Ambulatory Visit

## 2019-07-18 ENCOUNTER — Ambulatory Visit: Admitting: Obstetrics & Gynecology

## 2019-07-20 ENCOUNTER — Ambulatory Visit

## 2019-07-21 ENCOUNTER — Ambulatory Visit

## 2019-07-21 ENCOUNTER — Ambulatory Visit: Admitting: Family Medicine

## 2019-07-21 NOTE — Progress Notes (Signed)
 Receipt of: wart removal    The following were sent to 'Wendall Mola' at [facility] .com on 07/21/2019 2:18:28 PM:     - Secure message created from [facility] template     - Attachment created from [facility] template

## 2019-07-21 NOTE — Progress Notes (Signed)
 Primary Provider:  Ellan Lambert MD      History of Present Illness:  38 yo woman is here for FU on her allergic sinusitis.   Just started on metformin for PCO.   Also has wart she would like  removed.         Vital Signs     Patient: 38 Years Old Female  Height:  60.75 in.  Weight: 153 lbs      Wt Chg: -1 since 06/09/2019  BMI:  29.25        29.44 on 06/09/2019  BP:  104/74     107/75 on 06/09/2019   Temp:  97.9  F      Pulse:  106         Pulse Ox: 98 %    Comments: Paige [name] is a 38 Year Old Female who is here today for a wart removal on her neck and also to discus post nasal drip constantly with sinus headaches  Medications and Allergies Reviewed    Signed: Creig Hines Franklin.Marland KitchenMarland KitchenMarland KitchenNovember  5, 2020 11:52 AM    PHQ 2    Over the last 2 weeks, how often have you been bothered by any of the following problems?  1. Little interest or pleasure in doing things:  0   - Not at all  2. Feeling down, depressed, or hopeless:  0   - Not at all            Immunization History:  Historical Source: Historical information - Patient recall   Influenza: Influenza - Unspecified Formulation  #1 Historical (06/01/2019)    PHYSICAL EXAM    Gen: Healthy appearing, in NAD,  HEENT: unremarkable  Chest: clear, Heart: RRR, no murmur  Abdomen: soft, non tender, skin: there is a dangling lesion on her neck.         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       ~OBESITY, BMI 30-34.9, ADULT (E66.9), HYPERLIPIDEMIA (E78.5)  Working on diet.       ~  Wart:  Warned about scarring then area (s)  were frozen with liquid nitrogen x 3, FU in 3 weeks if not improved.           Problems Reviewed  Patient/Caregiver understand instructions and plan.    Changes to Medication List Documented Today:   METFORMIN HCL 500 MG ORAL TABLET (METFORMIN HCL) take one by mouth twice daily for  polycystic ovary.; Route: ORAL  VITAMIN D 2000 UNIT ORAL CAPSULE (CHOLECALCIFEROL) Take one by mouth daily for  bones/low vitamin D; Route: ORAL  FLONASE ALLERGY RELIEF 50 MCG/ACT NASAL SUSPENSION (FLUTICASONE PROPIONATE) Use 1 spray twice daily each nostril for allergic rhinitis.; Route: NASAL    Patient Instructions    Use Flonase, rhinocort or nasonex daily.      Medications:  METFORMIN HCL 500 MG ORAL TABLET (METFORMIN HCL) take one by mouth twice daily for  polycystic ovary.  #180 x 4   Route:ORAL   Entered and Authorized by: Haynes Bast, M.D.   Signed by: Haynes Bast, M.D. on 07/21/2019   Method used: Handwritten   Note to Pharmacy: Route: ORAL;    RxID: [id number]

## 2019-07-26 ENCOUNTER — Ambulatory Visit: Admitting: Family Medicine

## 2019-08-15 ENCOUNTER — Ambulatory Visit

## 2019-08-15 NOTE — Telephone Encounter (Signed)
 Phone Note -     Patient    **Informational purposes only**    Call back at Ph1 [phone]  Initial call taken by: Creig Hines [place],  August 15, 2019 9:26 AM  Initial Details of Call:  Paige [name] is not working - has only been using it for 3 1/2 weeks, wants a Referral to [facility]. Will call there today to see how far they are booking out?

## 2019-08-16 ENCOUNTER — Ambulatory Visit

## 2019-08-16 NOTE — Progress Notes (Signed)
Orders:  Added new Referral order of Otolaryngology Referral (OTOL) - Signed

## 2019-08-25 ENCOUNTER — Ambulatory Visit

## 2019-08-25 NOTE — Progress Notes (Signed)
 Problems:  Added new problem of ALLERGY, 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) (ICD10-T78.40xA)  Orders:  Added new Referral order of Allergy Referral (ALLERG) - Signed  Added new Referral order of Allergy Referral (ALLERG) - Signed

## 2019-09-20 ENCOUNTER — Ambulatory Visit

## 2019-09-20 ENCOUNTER — Ambulatory Visit: Admitting: Nurse Practitioner

## 2019-09-20 NOTE — Progress Notes (Signed)
 Receipt of: Primary Care Office Visit - Video    The following were sent to 'Paige [name]' at [facility] .com on 09/20/2019 6:42:45 PM:     - Secure message created from ACM template     - Attachment created from ACM template

## 2019-09-20 NOTE — Progress Notes (Signed)
 Visit Type:  Telehealth  Primary Provider:  Ellan Lambert MD      History of Present Illness:  This real-time, interactive virtual Telehealth encounter was done by:    phone   /   video ZOOM  Two patient identifiers were used and confirmed.   The patient was at home  My location: office  Others participants:                        Their involvement:   Greater than 50% of the time was spent devoted to counseling/ coordinating care.  The patient has consented to this encounter type.  Total minutes spent:      patient called and requested telehealth visit.  She scraped a finger about one month ago on a rock while scrapping bird feces.  She works at [employer].  It has not healed and was pus filled so she soaked it and covered it - seemed to get  better - was on augmentin by ENT and the redness resolved but that was early December.  It remains pink/red and tender, no longer oozing any pus but won't resolve.          Vital Signs     Patient: 39 Years Old Female  Height:  60.75 in.  Prev Weight:  153 lbs   BMI: 29.25 on 07/21/2019   Old BP:  104/74 on 07/21/2019            Physical Exam    General:      obese.    Head:      normocephalic and atraumatic.    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     [place], [place]  [address]     Ph: [phone] or [phone]     Fax: [phone]   Note to Pharmacy: Route: ORAL;    RxID: [id number]          This real-time, interactive virtual Telehealth encounter was done by video  Two patient identifiers were used and confirmed.  The patient was at home.  My location: office  Other participants/Involvement:  0  Greater than 50% of the time was spent devoted to counseling/coordinating care.  The patient has consented to this encounter type.  Total minutes spent: 24    This treatment is voluntary and a claim will be submitted to your insurance company for payment. You agree there are certain limitations in providing your care in this manner in lieu of an in person visit. Some virtual applications are not considered HIPAA compliant and therefore not secure. If a platform is not HIPAA compliant you agree to use of this application for this visit.    Patient presents during the COVID-19 pandemic / federally declared state of public health emergency. This service conducted via video. Normal HIPAA rules have been waived and patient accepts and understands these conditions.

## 2019-09-21 ENCOUNTER — Ambulatory Visit

## 2019-09-21 ENCOUNTER — Ambulatory Visit: Admitting: Nurse Practitioner

## 2019-09-21 NOTE — Progress Notes (Signed)
 called patient as she has a foreign body under the skin on the finger affected      Orders:  Added new Referral order of Orthopedics Referral (ORTHO) - Signed  Observations:  Added new observation of ENBSRVTYPENC: Quick Note (09/21/2019 16:14)

## 2019-09-21 NOTE — Telephone Encounter (Signed)
 Phone Note -       Initial call taken by: Dorris Singh ,  September 21, 2019 1:05 PM  Initial Details of Call:  please redo the xray order it is 4th finger right and everthing elsecan stay the same             Orders:  Added new Test order of XR - Other (XR-OTHR) - Signed

## 2019-09-22 ENCOUNTER — Ambulatory Visit

## 2019-10-06 ENCOUNTER — Ambulatory Visit: Admitting: Otolaryngology

## 2019-10-06 ENCOUNTER — Ambulatory Visit

## 2019-10-07 ENCOUNTER — Ambulatory Visit

## 2019-10-12 ENCOUNTER — Ambulatory Visit

## 2019-10-12 NOTE — Progress Notes (Signed)
Orders:  Added new Referral order of Otolaryngology Referral (OTOL) - Signed  .refsum

## 2019-10-16 ENCOUNTER — Ambulatory Visit

## 2019-10-17 ENCOUNTER — Ambulatory Visit

## 2019-12-06 ENCOUNTER — Ambulatory Visit

## 2019-12-27 ENCOUNTER — Ambulatory Visit

## 2019-12-27 ENCOUNTER — Ambulatory Visit: Admitting: Orthopaedic Surgery

## 2019-12-28 LAB — HX COVID19 BY PCR CIRCLE HEALTH: HX COVID19 RESULT: NOT DETECTED

## 2019-12-30 ENCOUNTER — Ambulatory Visit

## 2019-12-30 ENCOUNTER — Ambulatory Visit: Admitting: Orthopaedic Surgery

## 2019-12-30 LAB — HX URINE CHORIONIC GONATROPIN: HX URINE CHORIONIC GONATROPIN: NEGATIVE

## 2019-12-30 NOTE — Op Note (Signed)
 [facility] OPERATIVE REPORT            Rpt # [id number]                              [facility]                                 [address]                                  [place], [place] [address]                                    [phone]  Patient:            Paige [name], WAYMENT                  DOB:                August 30, 1981  Pt. Location:       M.SDC       MR #:               [id number]         Account # [id number]  Admission Date:     12/30/19   Attending M.D.:     [facility] MD                 Primary Care:       Ellan Lambert MD      Report Status:      Signed     ______________________________________________________________________                                                                  [facility] OPERATIVE REPORT                     SURGEON:  Glade Nurse, MD     DATE OF OPERATION:  12/30/2019     PREOPERATIVE DIAGNOSIS(ES):  Right ring finger foreign body.     POSTOPERATIVE DIAGNOSIS(ES):  Possible inclusion cyst.     OPERATION:  Excision right ring finger mass.     ANESTHETIC TECHNIQUE:  Local with sedation using lidocaine 1% plain in 9:1 ratio   with sodium bicarbonate for a total of 5 mL along with 5 mL of Marcaine 0.25%   plain.     COMPLICATIONS:  None.     SPECIMENS:  Mass sent to pathology.     CONDITION:  Good condition to recovery.     INDICATIONS FOR PROCEDURE:  [name] is a 39 year old woman who works for the   [employer], and in the process of being at work, she had a metallic foreign body   embedded in her finger.  There was a point a couple months ago where it   appeared almost infected, but she was unable to consider surgical excision at   the time, so we treated this with antibiotics.  This has remained scabbed over   and continues to be symptomatic, so  she presents now for mass excision.     DETAILS OF OPERATION:  Correct side and site of surgery were confirmed both in   the preoperative holding area and once again in the operating room with a   timeout.  Diedre was  placed supine on the operating stretcher, and her right   upper extremity was outstretched on the hand table.  A timeout for safety was   performed.  The right upper extremity was prepared and draped in the usual   sterile fashion.  A digital block was performed under sterile conditions.  She   tolerated this well with her sedation.  A finger base tourniquet was placed   over her right ring finger.  A 1 cm incision was made overlying the apex of her   palpable mass.  She had what appeared to be fibrotic thickened-appearing   subcutaneous tissue consistent with scar tissue and possibly an inclusion cyst.    No metallic foreign body was found.  This was confirmed with fluoroscopy.  The   mass was sent off to pathology.  The wound was thoroughly irrigated and closed   with 4-0 nylon suture.  A dry sterile dressing was applied.  The tourniquet was     Rpt # [id number]  [facility]     [facility] OPERATIVE REPORT               Patient: [name], [name]                  MR #:    [id number]    __________________________________________________________________________      removed with quick return of blood flow to the fingertips.  All counts were   correct.  Blood loss was 2 mL.  The patient returned to recovery in good   condition.           ________________________________________  Dictated By:  Glade Nurse, MD  D:  12/31/2019 16:25   T:  12/31/2019 21:57  Job#/Voice#:  [id number]   EM/BR       THIS DOCUMENT IS A DRAFT AND WAS REPRODUCED FROM ITS ELECTRONIC FORM FOR  INFORMATIONAL PURPOSES ONLY.  THIS DRAFT SHOULD NOT, UNDER ANY CIRCUMSTANCES,  BE RELIED ON AS A COMPLETE AND FINAL REPRODUCTION OF THE LEGAL MEDICAL RECORD                 UNLESS AUTHENTICATED BY THE RESPONSIBLE AUTHOR.                                        ELECTRONICALLY SIGNED                                       Glade Nurse, MD                                                   01/08/20    1553

## 2019-12-31 ENCOUNTER — Ambulatory Visit

## 2019-12-31 ENCOUNTER — Ambulatory Visit: Admitting: Otolaryngology/Facial Plastic Surgery

## 2019-12-31 LAB — HX COVID19 BY PCR CIRCLE HEALTH: HX COVID19 RESULT: NOT DETECTED

## 2020-01-02 ENCOUNTER — Ambulatory Visit

## 2020-01-02 ENCOUNTER — Ambulatory Visit: Admitting: Otolaryngology/Facial Plastic Surgery

## 2020-01-02 LAB — HX URINE CHORIONIC GONATROPIN: HX URINE CHORIONIC GONATROPIN: NEGATIVE

## 2020-01-02 NOTE — Op Note (Signed)
 [facility] OPERATIVE REPORT            Rpt # [id number]                              [facility]                                 [address]                                  [place], [place] [address]                                    [phone]  Patient:            Paige [name], BUCHBINDER                  DOB:                [birth year]  Pt. Location:       M.SDC       MR #:               [id number]         Account # [id number]  Admission Date:     01/02/20   Attending M.D.:     Elyse Jarvis MD               Primary Care:       Ellan Lambert MD      Report Status:      Signed     ______________________________________________________________________                                                                  [facility] OPERATIVE REPORT                     SURGEON:  Elyse Jarvis, MD     DATE OF OPERATION:  01/02/2020     PREOPERATIVE DIAGNOSIS(ES):  1.  Nasal obstruction.  2.  Septal deviation.  3.  Bilateral inferior turbinate hypertrophy.     POSTOPERATIVE DIAGNOSIS(ES):  1.  Nasal obstruction.  2.  Septal deviation.  3.  Bilateral inferior turbinate hypertrophy.     PROCEDURES PERFORMED:  Open septorhinoplasty with major septal repair and   bilateral inferior turbinate submucosal resection.     ANESTHESIA:  General endotracheal.     COMPLICATIONS:  None.     DRAINS:  None.     SPECIMENS:  None.     ESTIMATED BLOOD LOSS:  40 mL.     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  Risks, benefits, and alternatives were   explained.  Informed consent was obtained.  The patient was consented for the   above surgery.     OPERATIVE FINDINGS:  1.  Severely deviated septum  to the left with inferior septal spur and high   septal deviation to the left along the top of the dorsum and high septal    deviation to the left posteriorly.  2.  Nasal dorsum deviated severely off to the right.  3.  Nasal bones deviated severely off to the right.     Rpt # [id number]  [facility]     [facility] OPERATIVE REPORT               Patient: Paige [name]                  MR #:    [id number]    __________________________________________________________________________     4.  Caudal septal deflection slightly off to the right.  5.  Placement of extended spreader on the right and spreader on the left   fashioned from septal cartilage.  6.  Bilateral medial and lateral osteotomies with an intermediate osteotomy on   the left.  7.  Trimming of the left upper lateral cartilage.  8.  Placement of caudal septal extension graft.  9.  Placement of small columellar strut graft.  10.  Bilateral inferior turbinate hypertrophy.  11.  Placement of crushed cartilage grafts over the nasal dorsum.  12.  Interdomal sutures placed.     OPERATIVE PROCEDURE:  The patient was met in the preoperative area.  Informed   consent was obtained.  All questions answered.  Patient was brought to the   operative suite, placed in supine position on the operating table.  Patient was   intubated by Anesthesia.  Eyes were taped shut.  Head of bed rotated 180   degrees.  The 6-0 Prolene sutures were placed in the eyelid skin as   tarsorrhaphy sutures.  These were removed at the end of the case.  Prior to   that, the patient's identification and procedure to be performed were   confirmed.  The nose was injected with 10 mL of 1% lidocaine with 1:100,000   epinephrine.  The right ear was also injected later on in the case as it was   thought we may need conchal cartilage, which ultimately we did not.   Afrin-soaked pledgets were placed inside the nose and patient was prepped and   draped in a sterile fashion.  The Afrin-soaked pledgets were removed.  An   inverted V incision was made in the narrowest portion of the columellar and a   skin soft tissue  envelope was elevated over the nasal cartilages up over the   nasal dorsum.  This was transitioned from a supraperichondrial to a   subperiosteal plane over the nasal bones.  The domes were divided in the   midline and the caudal aspect of the septum was identified.  Mucoperichondrial   flaps were elevated on either side of the septum and then the upper lateral   cartilages were divided from the dorsal septum.  At this point, the deviated   portion of the septal cartilage was harvested with care to leave approximately   1.5 cm dorsal and caudal strut of septal cartilage for tip support.  The caudal   septum was disarticulated from its attachment to the anterior face of the nasal   spine as it was deviated off to the right-hand side.  A portion of the caudal   septum was trimmed so that it could be more easily repositioned later.  A   severe septal spur extending all the way to the back  of the nose was removed   with a straight osteotome inside the nose.  Superior incisions were made along   the septal bone and cartilage to avoid rocking on the cribriform plate prior to   removing the cartilage.  Once the cartilage was harvested, it was placed in   saline for use at a later point.  Then, the medial osteotomies were created   with a straight guarded osteotome followed by an intermediate osteotomy on the   left-hand side given the longer nature of the left nasal bone since the nose   was severely deviated off to the right.  This was done through a percutaneous   incision using a 2 mm osteotome and a postage stamp in a perforated fashion.   Lateral osteotomies were then made on either side through an intranasal   approach by making a stab incision over the pyriform aperture just above the   head of the inferior turbinates bilaterally.  Prior to making the osteotomies,   subperiosteal tunnels were dissected over the anterior face of the maxilla   using a caudal elevator.  Once the osteotomies were created, the nose was    pushed off to the left and the septum fractured as well to allow for     Rpt # [id number]  [facility]     [facility] OPERATIVE REPORT               Patient: NASYA, VINCENT                  MR #:    [id number]    __________________________________________________________________________      repositioning of the nose in the midline.  I then fashioned spreader grafts out   of the septal cartilage and placed them and sutured them in place with an   extended spreader on the right and a spreader on the left using interrupted 5-0   PDS horizontal mattress sutures.  I then sutured a perforation in the left   septal flap using a 4-0 Vicryl suture.  The right septal flap was not violated   during the case.  I then placed a mattress suture with a 4-0 chromic suture   through the septal flaps while holding the tip in its appropriate position.  I   then placed a caudal septal extension graft on the left-hand side of the caudal   septum and sutured this in place with multiple interrupted horizontal mattress   5-0 PDS sutures and locked this in with the extended spreader and spreader   grafts at the junction of all of these grafts at the anterior septal angle.  I   had previously sutured the caudal aspect of the septum to the anterior nasal   spine by using an 18-gauge needle to make a drill hole in the anterior nasal   spine and then using a 4-0 Prolene suture to anchor the caudal septum to the   anterior nasal spine in the midline.     Attention was then drawn to the inferior turbinates.  Stab incisions were made   to the head of the inferior turbinates bilaterally.  Subperiosteal pockets were   elevated with a Cottle elevator.  A microdebrider with a 2 mm turbinate blade   was inserted into the pockets and was turned on and rotated in a 360-degree   fashion to remove both submucosal tissue within the turbinates and turbinate   bone.  Turbinate bone was also resected from the anterior aspect  of the   turbinate using  Takahashi forceps on both sides.  The turbinates were then   outfractured using a Boies elevator.  I then set the tip in the appropriate   position and sutured it in place with multiple transvestibular 4-0 plain gut   sutures.  I sutured the domes together with an inter- and intradomal 5-0 PDS   suture with care to take a little bit more of the dome on the left-hand side   because it was slightly enlarged compared to the right-hand dome given the long   history of deviation.  The superior aspect of the left upper lateral cartilage   was also trimmed because it was overriding once the nose was positioned in the   midline.  The upper lateral cartilages were previously suspended to the   spreader grafts using multiple interrupted 5-0 PDS sutures.  The patient lost a   bit of her dorsal height with realignment of the nose in the midline and with   the intermediate osteotomies and so 2 small crushed cartilage grafts were   placed over the nasal dorsum after being crushed in the Sheen cartilage   crusher.  The nose was then closed with multiple interrupted 5-0 fast-absorbing   gut sutures that were both in a vertical mattress and a simple interrupted   fashion.  Doyle splints were placed on both sides of the nose after being   coated with bacitracin and sutured in place with a 2-0 Prolene suture.   Surgifoam was placed up underneath the nasal bones bilaterally to help support   them.  Mastisol followed by Steri-Strips were placed over the nose.  A single   5-0 fast-absorbing gut was used to close the single percutaneous osteotomy   incision on the left-hand side of the nasal dorsum.  The tarsorrhaphy sutures   were removed.  Patient was turned over to Anesthesia for awakening, transferred   to the PACU in stable condition.  All counts were correct at the end the case.   I was present for the entire case.           ________________________________________  Dictated By:  Elyse Jarvis, MD     Rpt #  [id number]  [facility]     [facility] OPERATIVE REPORT               Patient: JOYCLYN, PLAZOLA                  MR #:    [id number]    __________________________________________________________________________     D:  01/02/2020 13:03   T:  01/02/2020 22:08  Job#/Voice#:  [id number]   LI/NI/LB       THIS DOCUMENT IS A DRAFT AND WAS REPRODUCED FROM ITS ELECTRONIC FORM FOR  INFORMATIONAL PURPOSES ONLY.  THIS DRAFT SHOULD NOT, UNDER ANY CIRCUMSTANCES,  BE RELIED ON AS A COMPLETE AND FINAL REPRODUCTION OF THE LEGAL MEDICAL RECORD                 UNLESS AUTHENTICATED BY THE RESPONSIBLE AUTHOR.                                        ELECTRONICALLY SIGNED  Elyse Jarvis, MD                                                 01/04/20    1821

## 2020-01-02 NOTE — H&P (Signed)
 Rpt # [id number]  [facility]  [address]  [place], [place] [address]  [phone]    Patient:         Paige [name], Paige [name]  DOB:             07/30/1981  MR #:            [id number]             Acct #:  [id number]  Admission Date:  Attending M.D.:  Elyse Jarvis MD  Primary Care:    Ellan Lambert MD  Report Status:   Signed  ________________________________________________________________________________  HISTORY & PHYSICAL        History & Physical    *  * * *    PROCEDURE DATE:  01/02/20      ------------------------------    HPI/PMH/PSH:        REPORT INITIATION DATE:  01/02/20      CHIEF COMPLAINT:  NASAL OBSTRUCTION/NASAL DEFORMITY/SEPTAL DEVIATION      DIAGNOSIS:  [] nasal obstsructoin      HISTORY OF PRESENT ILLNESS:    Paige [name] is a 39 year old Female patient who presents with [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 refratory  to medical management.]      PAST MEDICAL / SURGICAL ILLNESS:    [] reviewed      PERTINENT FAMILY & SOCIAL HISTORY:    [] reviewed        PHYSICAL EXAM:    Vital Signs/I&O:      Temperature: 99.4  Heart Rate: 75  Blood Pressure: 113/68  Respiratory Rate: 18      Vital Signs - Last 16 Hours        Date Time Temp Pulse Resp B/P B/P Pulse O2 O2 Flow FiO2    Mean Ox Delivery Rate    04/19 0637 37.4 75 18 113/68  100        Intake & Output- Last 72 Hours        04/17 2300 04/18 2300 04/19 2300    Intake Total    Output Total    Balance        Patient   150 lb    Weight          General Appearance:  NAD    Cardiovascular:  good peripheral vascularity    Chest/Lungs:  No respiratory distress    Abdominal:  Non-tender    Neurological:  Alert      IMPRESSION:    Impression:      nasal obstructoin        PLAN:    Plan:      plan for open septorhinoplasty, bilateral inferior turbinate reductoin. possible  auricular cartilage, rib cartilage, donor rib cartilage, temporalis fascia  graft.          ELECTRONICALLY  SIGNED  INSALACO,LOUIS MD  01/02/20   0718        This document was reproduced from its electronic form  and should not, under any circumstances, be considered  a complete and final reproduction of the legal medical  record unless signed by the responsible author.

## 2020-01-09 ENCOUNTER — Ambulatory Visit

## 2020-01-12 ENCOUNTER — Ambulatory Visit

## 2020-01-20 ENCOUNTER — Ambulatory Visit

## 2020-01-31 ENCOUNTER — Ambulatory Visit

## 2020-03-15 ENCOUNTER — Ambulatory Visit

## 2020-03-15 ENCOUNTER — Ambulatory Visit: Admitting: Nurse Practitioner

## 2020-03-15 NOTE — Telephone Encounter (Signed)
 Phone Note -     Outgoing Call    Initial call taken by: Remo Lipps NP,  March 15, 2020 12:03 PM  Call placed to: Patient  Summary of Call: called with regards to her secure message  left a message to call me on my cell      Follow-up #1  Details: did get in touch with her - wil have her do warm water soaks 2-3 times per day  do not squeeze it  will add antibiotics  sounds like she may have a yeast infection in axilla  no change in soaps or detergents  she has not shaved or used deodorant but since there  will try lotrisone  By: Remo Lipps NP ~ March 15, 2020 12:50 PM      Medications:  CLOTRIMAZOLE-BETAMETHASONE 1-0.05 % EXTERNAL CREAM (Clotrimazole-Betamethasone) apply to affected area two times daily for fungal rash  #1[Tube] x 1   Route:EXTERNAL   Entered and Authorized by: Remo Lipps NP   Signed by: Remo Lipps NP on 03/15/2020   Method used: Electronically to      [id number]  [address]  [place], [place]  [address]  Ph: [phone] or [phone]  Fax: [phone]   Note to Pharmacy: Route: EXTERNAL;    RxID: [id number]  AMOXICILLIN-POT CLAVULANATE 875-125 MG ORAL TABLET (AMOXICILLIN-POT CLAVULANATE) take one by mouth two times daily for 5 days for infection  #10[Tablet] x 0   Route:ORAL   Entered and Authorized by: Remo Lipps NP   Signed by: Remo Lipps NP on 03/15/2020   Method used: Electronically to      [id number]  [address]  [place], [place]  [address]  Ph: [phone] or [phone]  Fax: [phone]   Note to Pharmacy: Route: ORAL;    RxID: [id number]        Medications:  Added new medication of AMOXICILLIN-POT CLAVULANATE 875-125 MG ORAL TABLET (AMOXICILLIN-POT CLAVULANATE) take one by mouth two times daily for 5 days for infection; Route: ORAL - Signed  Added new medication of CLOTRIMAZOLE-BETAMETHASONE 1-0.05 % EXTERNAL CREAM (Clotrimazole-Betamethasone) apply to affected area two times daily for fungal rash;  Route: EXTERNAL - Signed  Rx of AMOXICILLIN-POT CLAVULANATE 875-125 MG ORAL TABLET (AMOXICILLIN-POT CLAVULANATE) take one by mouth two times daily for 5 days for infection; Route: ORAL  #10[Tablet] x 0;  Signed;  Entered by: Remo Lipps NP;  Authorized by: Remo Lipps NP;  Method used: Electronically to [facility] [phone]*, [address] [place], [place]  [address], Ph: [phone] or [phone], Fax: [phone]; Note to Pharmacy: Route: ORAL;  Rx of CLOTRIMAZOLE-BETAMETHASONE 1-0.05 % EXTERNAL CREAM (Clotrimazole-Betamethasone) apply to affected area two times daily for fungal rash; Route: EXTERNAL  #1[Tube] x 1;  Signed;  Entered by: Remo Lipps NP;  Authorized by: Remo Lipps NP;  Method used: Electronically to [facility] [phone]*, [address] [place], [place]  [address], Ph: [phone] or [phone], Fax: [phone]; Note to Pharmacy: Route: EXTERNAL;

## 2020-03-22 ENCOUNTER — Ambulatory Visit

## 2020-03-23 ENCOUNTER — Ambulatory Visit: Admitting: Nurse Practitioner

## 2020-03-23 NOTE — Telephone Encounter (Signed)
 Phone Note -     Outgoing Call    Initial call taken by: Remo Lipps NP,  March 23, 2020 9:45 AM  Call placed to: Patient  Summary of Call:   called her in response to her secure message        Medications:  DOXYCYCLINE MONOHYDRATE 100 MG ORAL CAPSULE (DOXYCYCLINE MONOHYDRATE) take one capsule by mouth twice daily  for infection  #20[Capsule] x 0   Route:ORAL   Entered and Authorized by: Remo Lipps NP   Signed by: Remo Lipps NP on 03/23/2020   Method used: Electronically to      [id number]  [address]  [place], [place]  [address]  Ph: [phone] or [phone]  Fax: [phone]   Note to Pharmacy: Route: ORAL;    RxID: [id number]  Cancelled AMOXICILLIN-POT CLAVULANATE 875-125 MG ORAL TABLET (AMOXICILLIN-POT CLAVULANATE) take one by mouth two times daily for 5 days for infection  #10[Tablet] x 0   Route:ORAL   Stopped (Inactive)   Entered and Authorized by: Remo Lipps NP   Signed by: Remo Lipps NP on 03/23/2020   Method used: Electronically to      [id number]  [address]  [place], [place]  [address]  Ph: [phone] or [phone]  Fax: [phone]   RxID: [id number]        Medications:  Removed medication of AMOXICILLIN-POT CLAVULANATE 875-125 MG ORAL TABLET (AMOXICILLIN-POT CLAVULANATE) take one by mouth two times daily for 5 days for infection; Route: ORAL - Signed  Added new medication of DOXYCYCLINE MONOHYDRATE 100 MG ORAL CAPSULE (DOXYCYCLINE MONOHYDRATE) take one capsule by mouth twice daily  for infection; Route: ORAL - Signed  Cancelled Cancelled Rx of AMOXICILLIN-POT CLAVULANATE 875-125 MG ORAL TABLET (AMOXICILLIN-POT CLAVULANATE) take one by mouth two times daily for 5 days for infection; Route: ORAL  #10[Tablet] x 0;  Signed;  Entered by: Remo Lipps NP;  Authorized by: Remo Lipps NP;  Method used: [facility] [phone]*, [address] [place], [place]  [address], Ph: [phone] or [phone], Fax:  [phone]  Rx of DOXYCYCLINE MONOHYDRATE 100 MG ORAL CAPSULE (DOXYCYCLINE MONOHYDRATE) take one capsule by mouth twice daily  for infection; Route: ORAL  #20[Capsule] x 0;  Signed;  Entered by: Remo Lipps NP;  Authorized by: Remo Lipps NP;  Method used: [facility] [phone]*, [address] [place], [place]  [address], Ph: [phone] or [phone], Fax: [phone]; Note to Pharmacy: Route: ORAL;

## 2020-05-02 ENCOUNTER — Ambulatory Visit

## 2020-05-02 NOTE — Progress Notes (Signed)
 Orders:  Added new Referral order of Orthopedics Referral (ORTHO) - Signed    .refsum

## 2020-05-07 ENCOUNTER — Ambulatory Visit

## 2020-05-09 ENCOUNTER — Ambulatory Visit

## 2020-06-06 ENCOUNTER — Ambulatory Visit

## 2020-06-14 ENCOUNTER — Ambulatory Visit

## 2020-06-14 ENCOUNTER — Ambulatory Visit: Admitting: Obstetrics & Gynecology

## 2020-06-14 NOTE — Progress Notes (Signed)
 mammo 07/23/20 8:20am [place]  pt made apt last year    Observations:  Added new observation of ENBSRVTYPENC: Quick Note (06/14/2020 9:02)

## 2020-06-14 NOTE — Progress Notes (Signed)
 GYN Visit    OBGYN Issues  PCP: Dr. Ernest Haber in [place]  myriad faminly history screening done on 06/09/19, 06/14/20  plan: she does not meet current guidelines for this test. She declined this test today.     06/14/20  SD:BNRWKE  Other c/o:  none  LMP: 06/02/20  Last mammogram : 07/18/19  Last pap: 06/09/19, 07/30/17 wnl, 10/31/15 11/01/14 NILM, HPV Neg  Any history of abnormal pap  in 2002[xxx]next pap/hpv in 2021  Marital status: Married  Lives: w/ husband and  2 sons   Servando Salina active               current partner:   9 y  Acupuncturist control: none  HPV vaccine:  Y  ALLERGIES: NKDA  Medications: Metformin, prenatal vitamin, vit D, Allegra, WOMEN'S MVT, she works out side  all   adv pnv/dha and vit d   she was on metformin  she is done with with breast feeding.   GDM - diet control 2nd pregnancy in 2017        Vital Signs   Weight: 154 lb. (70 kg.)  Height:          60.75 in. (154.31 cm.)  Body Mass Index: 29.44  BSA:  1.69    LMP: 06/02/2020      Blood Pressure #1: 113 / 78 mm Hg (right arm)  Vitals taken by:  Roney Marion Moro....................Marland KitchenSeptember 30, 2021 8:05 AM        History of Present Illness:    Chief Complaint: Annual exam  HPI: s/p medrer covdi in 12/2019  on metformin two times daily - month   call once    left nipple occ - itching    oldest son - may have autism or ADHD  attemp pregnancy for 4 mos  dx: PCOS and on metformin  Perfoms Self Breast Exam: Yes  Given instructions: No  GYN History:   Patient's Age: 39 Years Old  Gravida: 3  Para: 2  Prev C-Section: 0  NSVD:  2  TAB:  0  Spont. Ab: 1  Ectopic Preg: 0  Living Children: 2  Mult. Births: 0  Menarche (yrs):  7  Length of period: 3-4  LMP: 06/02/2020    Sexual Health Sexually Active? Yes  Satisfied with Sex? Yes  Partners Female  Multiple Partners No  History of STD's? yes  Desire Pregnancy? 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) (ICD10-D22.9)  HX OF DIABETES MELLITUS, GESTATIONAL (ICD-V13.29) (ICD10-Z86.32)  HYPERLIPIDEMIA (ICD-272.4) (ICD10-E78.5)  PREVENTIVE HEALTH CARE (ICD-V70.0) (ICD10-Z00.00)  ECZEMA (ICD-692.9) (ICD10-L30.9)      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)    Current Medications - Reviewed Today  METFORMIN HCL 500 MG ORAL TABLET (METFORMIN HCL) take one by mouth twice daily for  polycystic ovary.; Route: ORAL  VITAMIN D 2000 UNIT ORAL CAPSULE (CHOLECALCIFEROL) Take one by mouth daily for bones/low vitamin D; Route: ORAL  ZYRTEC ALLERGY 10 MG ORAL TABLET (CETIRIZINE HCL) 1 by mouth daily for allergies; Route: ORAL  MULTIVITAMINS ORAL CAPSULE (MULTIPLE VITAMIN) one by mouth daily for health; Route: ORAL  FLONASE ALLERGY RELIEF 50 MCG/ACT NASAL SUSPENSION (FLUTICASONE PROPIONATE) Use 1 spray  twice daily each nostril for allergic rhinitis.; Route: NASAL  CLOTRIMAZOLE-BETAMETHASONE 1-0.05 % EXTERNAL CREAM (Clotrimazole-Betamethasone) apply to affected area two times daily for fungal rash; Route: EXTERNAL    Current Allergies (No new allergies added) - Reviewed Today  * NO KNOWN DRUG  ALLERGIES.        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 - wil be going to [employer] degree    Risk Factors  Tobacco Use:  never smoked  Passive smoke exposure: No  Alcohol Use:  no  Substance Abuse:  no  Caffeine (drinks/day):  2-3  Sun exposure:  frequently  Exercise (times/week):  yes  Seatbelt use (%):  100  Exercise Comments: walking - AND ACTIVE AT WORK    Done  Family Medical History    -- Negative history of Colon Cancer   -- Negative history of Breast Cancer    -- Negative history of Other Cancer   -- Negative history of Diabetes   -- Positive history of Hyperlipidemia   -- Negative history of Hypertension   -- Positive history of CAD   -- Negative history of Stroke       Exercise: Yes  Times per week: yes  Exercise Comments:  walking - AND ACTIVE AT WORK      Hip Replacement? No  Knee Replacement? No  Pacemaker? No  Heart Murmur? No  Antibiotic prior to dental work? No    Violence Screening   Do you feel safe in your current relationship? Yes  Is a partner from a previous relationship making you feel unsafe now? No  Have you ever been hit, kicked, puched or otherwise hurt by someone? No  Have you ever been forced to have sex when you don't want to? No  Trauma/Violence Hx   Pt feels safe. She has never been abused nor forced to have sex.        All other pertinent systems were reviewed and are negative       Physical Exam     Constitutional: Alert, no acute distress, well hydrated, well developed, well nourished, appropriate dress,non-ill appearing.   Neck: supple, no adenopathy, no masses, thyroid normal size, no thyroid tenderness or nodules,.   Breasts: no asymmetry, no skin changes, no nipple discharge,no masses,no tenderness.   Cardiac: RRR, no murmurs, no gallops.   Pulmonary: No respiratory distress, no accessory muscle use, clear to auscultation.   Abdomen: nondistended, nontender, no guarding, normal BS, no hepatosplenomegaly, no hernias, no CVA tenderness bilaterally.   Vulva:  Normal appearance, normal hair distribution, no lesions or masses.   Urethra: Normal, no masses, non-tender, no discharge.   Vagina: Normal, rugated, physiologic discharge, no lesions, no masses, adequate pelvic support.   Cervix: Normal, no motion tenderness, no lesions.   Uterus: smooth, mobile, non-tender, adequate support, firm, no prolapse, average size.  Adnexa: Normal, no masses, mobile, nontender.   Rectum: EXTERNAL HEMORRHOIDS.   Pap done: Yes  HPV Done: Yes  Chlamydia culture done: No  GC culture done: No  Wet [place]: No       Assessment and Plan:     PROBLEM #1:  CERVIX, SCREENING FOR MALIGNANT NEOPLASM (Z12.4)  -      Orders: PAP/HPV;  Pap Smear (reimbursed)    PROBLEM #2:  ANNUAL GYNECOLOGICAL EXAMINATION (Z01.419)  -      The pateint was counseled regarding regular monthly self-examinations of the breasts, regular sustained exercise for @ least 30 minutes3-4 times per week, birth control options for the prevention of an unintended pregnancy, routine screening interval for mammogra, prevention of osteoporosis, importance of regular PAP smears, regular use of lap and shoulder seat belts.     She declined STD testing(gen prob, vag cx & blood tests)    Orders: Mammogram-Screening **;  PAP/HPV;  GYN Est, (40-64);  Pap Smear (reimbursed)      Problems Reviewed            Patient Instructions    Please schedule a follow-up appointment in 1 year.  adv return to office  if abnormal left nipple      Patient Instructions:  1)  Please schedule a follow-up appointment in 1 year.  2)  adv return to office  if abnormal left nipple    Orders:  Added new Service order of Patient Encounter ([id number]) - Signed  Added new Test order of Mammogram-Screening ** (MAMDIGSC) - Signed  Added new Test order of PAP/HPV (231) - Signed  Added new Service order of GYN Est, (40-64) ([id number]) - Signed  Added new Service order of Pap Smear (reimbursed) ([id number]) - Signed

## 2020-06-18 LAB — HX HPV WITH REFLEX TO GENOTYPE: HX HPV WITH REFLEX TO GENOTYPE: NEGATIVE

## 2020-06-20 ENCOUNTER — Ambulatory Visit

## 2020-06-20 NOTE — Progress Notes (Signed)
 Orders Added    Orders:  Added new Referral order of Orthopedics Referral (ORTHO) - Signed  Observations:  Added new observation of ENBSRVTYPENC: Orders (06/20/2020 14:16)

## 2020-06-21 ENCOUNTER — Ambulatory Visit (HOSPITAL_BASED_OUTPATIENT_CLINIC_OR_DEPARTMENT_OTHER)

## 2020-06-21 ENCOUNTER — Ambulatory Visit

## 2020-06-21 ENCOUNTER — Ambulatory Visit: Admitting: Orthopaedic Surgery

## 2020-06-21 NOTE — Progress Notes (Signed)
Receipt of: [facility]    The following were sent to Paige [name]' at [email or web address] on 07/02/2020 5:14:54 PM:     - Secure message created from ACM template     - Attachment created from ACM template

## 2020-06-21 NOTE — Progress Notes (Signed)
 History of Present Illness  PCP: Ellan Lambert MD  Visit Reason right hand   Chief Complaint: Right ring finger lesion  HPI:  Paige [name] is a 39 year old right-hand-dominant woman who I have seen previously for right ring finger surgery. At the time, she thought she had a foreign body. This initially caused some redness and inflammation which subsided with antibiotics. She ultimately returned with recurrent symptoms and we proceeded with attempting an excision of a right ring finger foreign body. Within the lesion was simply indurated and fibrotic tissue without an obvious foreign body. This tissue was sent off to pathology returned as chronic inflammation and fibrosis. Following this the wound healed up without difficulty. She reports that in the last couple weeks over the lesion has returned in the same area. She saw Dr. Donette Larry who is planning on excising this and placing a skin graft. She has come to see me today for a second opinion.  Notably, she works at the [employer]. 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 She knows that the turtle has Mycobacterium, and that some of the animals have Salmonella. 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) (ICD10-D22.9)  HX OF DIABETES MELLITUS, GESTATIONAL (ICD-V13.29) (ICD10-Z86.32)  HYPERLIPIDEMIA (ICD-272.4) (ICD10-E78.5)  PREVENTIVE HEALTH CARE (ICD-V70.0) (ICD10-Z00.00)  ECZEMA (ICD-692.9) (ICD10-L30.9)  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; Route: NASAL  CLOTRIMAZOLE-BETAMETHASONE 1-0.05 % EXTERNAL CREAM (Clotrimazole-Betamethasone) apply to affected area two times daily for fungal rash; Route: EXTERNAL    Allergies   * NO KNOWN DRUG  ALLERGIES.      Vital Signs    Weight: 154 lb. (70 kg.)    Height: 60.75 in. 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 There is a normal gait without an obvious limp. Examination of bilateral hands shows skin to be intact and normal  with regard to temperature, texture, and color. Patient has full range of motion of the wrist and fingers at the MCP, PIP, and DIP joints. There is 5/5 strength with thumb opposition, pinch, grip and interosseous function. Patient has intact and symmetric sensation throughout all fingertips. There is brisk  capillary refill of the fingertips.  No swelling, deformity, or atrophy, and has no instability of the wrist or DRUJ by exam.  There is no tenderness to palpation. She has an defined area about the size of a pencil eraser at the site of her previous scar of reddened thickened indurated.  Tissue with about 3 areas of pinpoint pustules.  This is nontender to palpation.              Assessment and Plan  Impression: Right ring finger skin lesion    Plan: At the opportunity to discuss Paige [name]'s case with our ID specialist Dr. Allena Katz.  Given her line of work this certainly opens her up to zoonotic infections which may not be seen in the regular population.  I have obtained some cultures and scrapings from her wound and sent it for cultures including fungal cultures viral cultures and AFB.  I was also able to review both my previous operative note as well as her hospital records and previous pathology reports.She is very concerned about any procedures which may keep her out of work.  Taking the small skin samples for further diagnosis were done in a way that she is able to cover the wounds and return to work without waiting for a wound to heal.  In the meantime she has been advised to wear gloves with all contact with animals or their food.  I will see her back next week for recheck.      ]

## 2020-06-22 ENCOUNTER — Ambulatory Visit

## 2020-06-28 ENCOUNTER — Ambulatory Visit (HOSPITAL_BASED_OUTPATIENT_CLINIC_OR_DEPARTMENT_OTHER)

## 2020-06-28 NOTE — Progress Notes (Signed)
 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  We had taken some cultures and scraped her lesion for tissue at her previous visit.  She actually felt like this improve the appearance of the lesion temporarily.  She has been covering her skin at work.  She has had no worsening of the lesion.  She has no pain with range of motion of her finger.  No fevers or chills.  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) (ICD10-D22.9)  HX OF DIABETES MELLITUS, GESTATIONAL (ICD-V13.29) (ICD10-Z86.32)  HYPERLIPIDEMIA (ICD-272.4) (ICD10-E78.5)  PREVENTIVE HEALTH CARE (ICD-V70.0) (ICD10-Z00.00)  ECZEMA (ICD-692.9) (ICD10-L30.9)  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)    Orthopedics Medication List:   METFORMIN HCL 500 MG ORAL TABLET (METFORMIN HCL) take one by mouth twice daily for  polycystic ovary.; Route: ORAL  VITAMIN D 2000 UNIT ORAL CAPSULE (CHOLECALCIFEROL) Take one by mouth daily for bones/low vitamin D; Route: ORAL  ZYRTEC ALLERGY 10 MG ORAL TABLET (CETIRIZINE HCL) 1 by mouth daily for allergies; Route: ORAL  MULTIVITAMINS ORAL CAPSULE (MULTIPLE VITAMIN) one by mouth daily for health; Route: ORAL  FLONASE ALLERGY RELIEF 50 MCG/ACT NASAL SUSPENSION (FLUTICASONE PROPIONATE) Use 1 spray twice daily each nostril for allergic rhinitis.; Route: NASAL  CLOTRIMAZOLE-BETAMETHASONE 1-0.05 % EXTERNAL CREAM  (Clotrimazole-Betamethasone) apply to affected area two times daily for fungal rash; Route: EXTERNAL    Allergies   * NO KNOWN DRUG  ALLERGIES.      Vital Signs    Weight: 154 lb. (70 kg.)    Height: 60.75 in. 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 There is a normal gait without an obvious limp. Examination of bilateral hands shows skin to be intact and normal with regard to temperature, texture, and color. Patient has full range of motion of the wrist and fingers at the MCP, PIP, and DIP joints. There is 5/5 strength with thumb opposition, pinch, grip and interosseous function. Patient has intact and symmetric sensation throughout all fingertips. There is brisk capillary refill of the fingertips.  No swelling, deformity, or atrophy, and has no instability of the wrist or DRUJ by exam.  There is no tenderness to palpation. She has an defined area about the size of  a pencil eraser at the site of her previous scar of reddened thickened indurated.   This is nontender to palpation.            Assessment and Plan  Impression: Right ring finger skin lesion    Plan: Paige [name] and I discussed the situation with her finger overall.  Given that some lesions are self-limited and would not necessarily require surgical excision, there may be some benefit to holding  off before proceeding with an excision of the skin.  Excision of the skin area may require grafting and may lead to stiffness given that it is on the back of the PIP joint.  For now it is remaining quite localized and not affecting the function of her hand or making her ill systemically.  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  We did discuss trying an over-the-counter steroid cream to see if this improve the appearance of the lesion.  I will plan to see her back in a couple weeks  to monitor the lesion.      ]

## 2020-06-28 NOTE — Progress Notes (Signed)
Receipt of: [facility]    The following were sent to Wendall Mola' at [email or web address] on 07/02/2020 5:19:58 PM:     - Secure message created from ACM template     - Attachment created from ACM template

## 2020-07-23 ENCOUNTER — Ambulatory Visit: Admitting: Obstetrics & Gynecology

## 2020-07-23 ENCOUNTER — Ambulatory Visit

## 2020-07-23 NOTE — Progress Notes (Signed)
Orders:  Added new Referral order of Orthopedics Referral (ORTHO) - Signed

## 2020-08-02 ENCOUNTER — Ambulatory Visit: Admitting: Orthopaedic Surgery

## 2020-08-02 ENCOUNTER — Ambulatory Visit

## 2020-08-02 ENCOUNTER — Ambulatory Visit (HOSPITAL_BASED_OUTPATIENT_CLINIC_OR_DEPARTMENT_OTHER)

## 2020-08-02 NOTE — Progress Notes (Signed)
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  We had taken some cultures and scraped her lesion for tissue at her previous visit.  She actually felt like this improve the appearance of the lesion temporarily.  She has been covering her skin at work.  The lesion is unchanged.  She has no pain with range of motion of her finger.  No fevers or chills.  It is still very localized. We are planning on repeat cultures and a biopsy today.  Her previous cultures have not shown anything.  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) (ICD10-D22.9)  HX OF DIABETES MELLITUS, GESTATIONAL (ICD-V13.29) (ICD10-Z86.32)  HYPERLIPIDEMIA (ICD-272.4) (ICD10-E78.5)  PREVENTIVE HEALTH CARE (ICD-V70.0) (ICD10-Z00.00)  ECZEMA (ICD-692.9) (ICD10-L30.9)  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)    Orthopedics Medication List:   METFORMIN HCL 500 MG ORAL TABLET (METFORMIN HCL) take one by mouth twice daily for  polycystic ovary.; Route: ORAL  VITAMIN D 2000 UNIT ORAL CAPSULE (CHOLECALCIFEROL) Take one by mouth daily for bones/low vitamin D; Route: ORAL  ZYRTEC ALLERGY 10 MG ORAL TABLET (CETIRIZINE HCL) 1 by mouth daily for allergies; Route: ORAL  MULTIVITAMINS ORAL CAPSULE (MULTIPLE VITAMIN) one by mouth daily for health; Route: ORAL  FLONASE ALLERGY RELIEF 50 MCG/ACT NASAL SUSPENSION (FLUTICASONE PROPIONATE) Use 1 spray twice daily each nostril for  allergic rhinitis.; Route: NASAL  CLOTRIMAZOLE-BETAMETHASONE 1-0.05 % EXTERNAL CREAM (Clotrimazole-Betamethasone) apply to affected area two times daily for fungal rash; Route: EXTERNAL    Allergies   * NO KNOWN DRUG  ALLERGIES.      Vital Signs    Weight: 154 lb. (70 kg.)    Height: 60.75 in. (154.31 cm.)  BMI: 29.44      Tobacco use: never smoked  Alcohol use: no          On examination today, patient appears well, in no acute distress. There is a normal gait without an obvious limp. Examination of bilateral hands shows skin to be intact and normal with regard to temperature, texture, and color. Patient has full range of motion of the wrist and fingers at the MCP, PIP, and DIP joints. There is 5/5 strength with thumb opposition, pinch, grip and interosseous function. Patient has intact and symmetric sensation throughout all fingertips. There is brisk capillary refill of the fingertips.  No swelling, deformity, or atrophy, and has no instability of the wrist or DRUJ by exam.  There is no tenderness to palpation. She has an defined area about the size of a pencil eraser at the site of her previous scar of reddened thickened indurated.   This is nontender to palpation.There are 2 small pinpoint pustules with expressible purulence.        She would like to try a cortisone injection. Prior to injection, we reviewed the risks and benefits. She was advised that it takes at least a week to feel the effects of the cortisone.     Procedure: A digital block was performed on the right ring finger with 0.5 mL of lidocaine (1%) combined with  0.25 mL of sodium bicarbonate (4.5%) for 5ml under sterile conditions.  After ensuring adequate anesthesia the pustules were expressed and the purulence was swabbed and sent for Gram stain and culture.  A 1.5 mm punch biopsy was performed centrally in the lesion.  A dry sterile dressing was applied.          Assessment and Plan  Impression: Right ring finger skin lesion    Plan: Paige [name] and I  discussed the situation with her finger overall.  Given that some lesions are self-limited and would not necessarily require surgical excision, there may be some benefit to holding off before proceeding with an excision of the skin.  Excision of the skin area may require grafting and may lead to stiffness given that it is on the back of the PIP joint.  For now it is remaining quite localized and not affecting the function of her hand or making her ill systemically.  At this point, I think we are dealing with an unusual skin lesion.  We will seek dermatology input.  I will discuss with Dr. Ernest Haber about the referral.  We will await the results of this biopsy as well.      ]

## 2020-08-02 NOTE — Progress Notes (Signed)
 Receipt of: [facility]    The following were sent to Wendall Mola' at [email or web address] on 08/03/2020 3:53:53 PM:     - Secure message created from ACM template     - Attachment created from ACM template

## 2020-08-02 NOTE — Progress Notes (Signed)
 [facility]   [address]   [place], [place] [address]  Office: [phone] Fax: [phone]      08/02/2020    RE:  Paige [name]  DOB:  March 31, 1981  [address]  [place], [place]  [address]    To Whom It May Concern,    The above named individual is under my medical care and may return to work on: November Sunday 21, 2021.         If you have any further questions or need additional information, please call.         Sincerely,        Glade Nurse, MD

## 2020-08-15 ENCOUNTER — Ambulatory Visit

## 2020-09-05 ENCOUNTER — Ambulatory Visit

## 2020-09-05 NOTE — Progress Notes (Signed)
Orders:  Added new Referral order of Dermatology Referral (DERM) - Signed

## 2020-09-15 NOTE — L&D Delivery Note (Signed)
 OB Delivery Note  05/06/2021 @23 :50pm  Paige [name]  40 y.o.     Diagnosis:  Diet control GDM  Mon-Di twin  AMA  36 3/[redacted] weeks    Gestational Age: [redacted]w[redacted]d  Gravida/Para: [id number]  Estimated Blood Loss:   Delivery Blood Loss  05/06/21 1800 - 05/07/21 0006    100cc          Quantitative Blood Loss:       Assistant : Dr. Clabe Seal, Girl TUNISHA RULAND [[id number]]    Labor Events    Preterm labor?: No  Antenatal steroids?: Full Course  Antibiotics during labor?: No  Sac identifier: Sac 1  Rupture type: Artificial  Fluid color: Clear  Labor type: Induced Onset of Labor  Trial of labor?: Yes  Induction indications: Other (Comment)  Complications: None     Labor Event Times    Labor onset date/time: 05/06/2021 1800  Dilation complete date/time: 05/06/2021 2234  Start pushing date/time: 05/06/2021 2205     Labor Length    1st stage: 4h 68m  2nd stage: 0h 68m  3rd stage: 0h 8m     Placenta    Placenta delivery date/time: 05/06/2021 2310  Placenta removal: Spontaneous  Placenta appearance: Central cord insertion  Placenta disposition: pathology     Cord    Vessels: 3 vessels  Complications: None  Delayed cord clamping?: No  Cord blood disposition: Lab  Gases sent?: No  Stem cell collection (by provider): No     Lacerations    Episiotomy: None  Perineal laceration: Superficial skin  Perineal laceration repaired?: Yes  Other lacerations?: No  Repair suture: 3-0 Vicryl     Anesthesia    Method: Epidural     Operative Delivery    Forceps attempted?: No  Vacuum extractor attempted?: No     Shoulder Dystocia    Shoulder dystocia present?: No     Newborn Delivery    Time head delivered: 05/06/2021 23:05:00  Birth date/time: 05/06/2021 23:06:00  Delivery type: Vaginal, Spontaneous  Complications: None     Resuscitation    Method: Drying, Bulb suctioning, Tactile stimulation     Apgars    Living status: Living  Apgars:  1 min.:  5 min.:  10 min.:  15 min.:  20 min.:    Skin color:  1  1       Heart rate:  2  2       Reflex  irritability:  2  2       Muscle tone:  2  2       Respiratory effort:  2  2       Total:  9  9       Apgars assigned by: Tamela Gammon     Delivery Providers    Delivering clinician: Richarda Osmond. Dorna Bloom, MD   Provider Role     Delivery Assist     Delivery Nurse    Joannie [place], RN Nursery Nurse    Debbe Odea, MD Neonatologist         [place], Girl KATHYA WILZ [[id number]]    Labor Events    Preterm labor?: No  Antenatal steroids?: Full Course  Antibiotics during labor?: No  Sac identifier: Sac 2  Rupture date/time: 05/06/2021 2307  Rupture type: Artificial  Fluid color: Clear  Fluid odor: None  Labor type: Induced Onset of Labor  Trial of labor?: Yes  Induction indications: Multiple Gestation, Other (Comment)  Complications: None  Labor Event Times    Dilation complete date/time: 05/06/2021 2234  Start pushing date/time: 05/06/2021 2308     Labor Length    1st stage: 4h 27m  2nd stage: 0h 2m  3rd stage: 0h 70m     Placenta    Placenta delivery date/time: 05/06/2021 2310  Placenta removal: Spontaneous  Placenta appearance: Intact  Placenta disposition: pathology     Cord    Vessels: 3 vessels  Complications: None  Delayed cord clamping?: Yes  Cord blood disposition: Lab  Gases sent?: No  Stem cell collection (by provider): No     Lacerations    Episiotomy: None  Perineal laceration: Superficial skin  Perineal laceration repaired?: Yes  Other lacerations?: No  Repair suture: 3-0 Vicryl     Anesthesia    Method: Epidural     Operative Delivery    Forceps attempted?: No  Vacuum extractor attempted?: No     Shoulder Dystocia    Shoulder dystocia present?: No     Newborn Delivery    Time head delivered: 05/06/2021 23:10:00  Birth date/time: 05/06/2021 23:10:00  Delivery type: Vaginal, Spontaneous  Complications: None     Resuscitation    Method: Bulb suctioning, Tactile stimulation, Drying     Apgars    Living status: Living  Apgars:  1 min.:  5 min.:  10 min.:  15 min.:  20 min.:    Skin color:  1  1       Heart rate:  2  2        Reflex irritability:  2  2       Muscle tone:  2  2       Respiratory effort:  2  2       Total:  9  9       Apgars assigned by: Tamela Gammon     Delivery Providers    Delivering clinician: Richarda Osmond. Dorna Bloom, MD   Provider Role     Delivery Assist    Gwyneth Revels, RN Delivery Nurse    Warrick Parisian, RN Nursery Nurse    Debbe Odea, MD Neonatologist    Wilhelmina Mcardle, RN            05/06/2021   09:33am start pitocin with [place] confirmed vtx/vtx by me  12:30pm cx 2/50%/-1  17:17pm cx 3-4/50%/-1, AROM with pitocin @ 78mu/min clear fluid  Scalp clip applied on twin A(L) Disa Riedlinger  19:58pm  cx 4/75%/-1, pitocin to 12mu/min Jahvier Aldea  21:15pm cx 5/75%/0, pitocin @ 18mu/min soft, there was on variable Ladarrion Telfair  22:34pm cx complete/0, pitocin @ 36mu/min  22:50pm to OR      [place]. Dorna Bloom, MD

## 2020-09-20 ENCOUNTER — Ambulatory Visit

## 2020-09-20 ENCOUNTER — Ambulatory Visit: Admitting: Women's Health

## 2020-09-20 NOTE — Telephone Encounter (Signed)
 Phone Note -       Initial call taken by: Chrisangel Eskenazi H. Dorianne Perret NP,  September 20, 2020 8:27 PM  Initial Details of Call:  Pt reports pos preg test  will [facility] bw [facility]      [facility] Calculations   EDC Confirmation:     New working [facility]: 05/31/2021     Last menses onset (LMP) date: 08/24/2020     EDC by LMP: 05/31/2021        Problems:  Added new problem of AMENORRHEA, SECONDARY, R/O PREGNANCY (ICD-626.0) (ICD10-N91.1)  Orders:  Added new Test order of PROG - Progesterone (PROG) - Signed  Added new Test order of HCG - Quant ** (HCG) - Signed  Added new Test order of PROG - Progesterone (PROG) - Signed  Added new Test order of HCG - Quant ** (HCG) - Signed  Added new Test order of PRNTL - Prenatal Panel (PRNTL) - Signed  Added new Test order of TSH -TSH (Only)** (TSH) - Signed  Added new Test order of T4F -Thyroxine, Free** (FT4) - Signed

## 2020-09-21 ENCOUNTER — Ambulatory Visit

## 2020-09-22 ENCOUNTER — Ambulatory Visit

## 2020-09-22 ENCOUNTER — Ambulatory Visit: Admitting: Women's Health

## 2020-09-22 LAB — HX PATIENT TYPE
HX 24HR URINE PROTEIN: 0
HX A CELL: 0
HX GLOMERULAR FILTRATION RATE: 0

## 2020-09-22 LAB — HX CBC W/DIFF
HX ABSOLUTE BASO COUNT AUTODIFF: 0.03 10*3/uL (ref 0.0–0.22)
HX ABSOLUTE EOS COUNT AUTODIFF: 0.11 10*3/uL (ref 0.0–0.45)
HX ABSOLUTE LYMPHS COUNT AUTODIFF: 1.79 10*3/uL (ref 0.74–5.04)
HX ABSOLUTE MONO COUNT AUTODIFF: 0.32 10*3/uL (ref 0.0–1.34)
HX ABSOLUTE NEUTRO COUNT AUTODIFF: 3.26 10*3/uL (ref 1.48–7.95)
HX BASOPHIL AUTOMATED: 0.5 %
HX EOSINOPHIL AUTOMATED: 2 %
HX HEMATOCRIT: 40.4 % (ref 36.0–47.0)
HX HEMOGLOBIN: 13.2 g/dL (ref 11.8–16.0)
HX IG AUTOMATED: 0.2 % (ref 0.0–2.0)
HX LYMPHOCYTE AUTOMATED: 32.4 %
HX MEAN CORP.HEMO.CONC.: 32.7 g/dL (ref 31.0–37.0)
HX MEAN CORPUSCULAR HEMOGLOBIN: 29.3 pg (ref 26.0–34.0)
HX MEAN CORPUSCULAR VOLUME: 89.6 fL (ref 80.0–100.0)
HX MEAN PLATELET VOLUME: 10.4 fL (ref 9.4–12.4)
HX MONOCYTE AUTOMATED: 5.8 %
HX NEUTROPHIL AUTOMATED: 59.1 %
HX NUCLEATED RBC %: 0 % (ref 0.0–0.0)
HX PLATELET COUNT: 274 10*3/uL (ref 150.0–400.0)
HX RED BLOOD COUNT: 4.51 10*6/uL (ref 3.9–5.2)
HX RED CELL DISTRIBUTION WIDTH SD: 43.8 fL (ref 35.0–51.0)
HX WHITE BLOOD COUNT: 5.5 10*3/uL (ref 3.7–11.2)

## 2020-09-22 LAB — EXTERNAL CBC W/DIFF
Hematocrit: 40.4 %
Hemoglobin: 13.2 g/dL
Platelets: 274 10*3/uL

## 2020-09-22 LAB — HX RUBELLA AB IGG: HX RUBELLA AB IGG: 38.2 [IU]/mL

## 2020-09-22 LAB — HX FREE T4: HX FREE T4: 0.99 ng/dL (ref 0.7–1.7)

## 2020-09-22 LAB — HX HEPATITIS B SURFACE ANTIGEN: HX HEPATITIS B SURFACE ANTIGEN: NEGATIVE

## 2020-09-22 LAB — HX SERUM HCG BETA SUBUNIT - QUANT: HX SERUM HCG BETA SUBUNIT - QUANT: 297 m[IU]/mL (ref <5)

## 2020-09-22 LAB — HX  COMPLETE BLOOD COUNT: HX ORDERED TEST: NEGATIVE

## 2020-09-22 LAB — EXTERNAL TSH: TSH: 1.69 u[IU]/mL

## 2020-09-22 LAB — HX SENSITIVE TSH W/O REFLEX: HX SENSITIVE TSH W/O REFLEX: 1.69 u[IU]/mL (ref 0.358–3.74)

## 2020-09-22 LAB — HX PROGESTERONE: HX PROGESTERONE: 10.5 ng/mL

## 2020-09-24 ENCOUNTER — Ambulatory Visit

## 2020-09-24 ENCOUNTER — Ambulatory Visit: Admitting: Women's Health

## 2020-09-24 LAB — HX SERUM HCG BETA SUBUNIT - QUANT: HX SERUM HCG BETA SUBUNIT - QUANT: 744 m[IU]/mL (ref <5)

## 2020-09-24 LAB — HX PROGESTERONE: HX PROGESTERONE: 24.2 ng/mL

## 2020-09-24 NOTE — Telephone Encounter (Signed)
 Phone Note -       Initial call taken by: Gowri Suchan H. Nunzio Cobbs NP,  September 24, 2020 10:40 AM  Initial Details of Call:  reviewed bw with pt  continue on prog support through 12 weeks  will go for bw 1/14 and sch [place]  mondays are best for [place] - sch 1/31 @ 10am [facility]  has amenorrhea visit 10/02/20  call with any pain/bleeding   she verbalized understanding of the above, all questions answered.               Problems added or changed during this visit: ENCOUNTER FOR ANTENATAL SCREENING FOR UNCERTAIN DATES (ICD10-Z36.87)          Family Medical History    -- Negative history of Colon Cancer   -- Negative history of Breast Cancer    -- Negative history of Other Cancer   -- Negative history of Diabetes   -- Positive history of Hyperlipidemia   -- Negative history of Hypertension   -- Positive history of CAD   -- Negative history of Stroke         Hip Replacement? No  Knee Replacement? No  Pacemaker? No  Heart Murmur? No  Antibiotic prior to dental work? No      Trauma/Violence Hx   Pt feels safe. She has never been abused nor forced to have sex.        Past Pregnancy History      Gravida:  4    Pregnancy # 4     Comments:  on prog support        Problems:  Added new problem of ENCOUNTER FOR ANTENATAL SCREENING FOR UNCERTAIN DATES [id number])  Orders:  Added new Test order of HCG - Quant ** (HCG) - Signed  Added new Test order of PROG - Progesterone (PROG) - Signed  Added new Test order of Varicella-Zoster AB (VZA) - Signed  Added new Test order of HIV (HIV) - Signed  Added new Test order of HEPA - Hepatitis Panel (HEPA) - Signed  Added new Test order of GLU -Glucose (GLUC) - Signed  Added new Test order of TSH -TSH (Only)** (TSH) - Signed  Added new Test order of T4F -Thyroxine, Free** (FT4) - Signed  Added new Test order of US-1st Trimester under 14 WKS (UPREG1ST) - Signed

## 2020-09-25 ENCOUNTER — Ambulatory Visit: Admitting: Women's Health

## 2020-09-25 LAB — HX RAPID PLASMA REAGIN: HX RAPID PLASMA REAGIN: NONREACTIVE

## 2020-09-25 NOTE — Progress Notes (Signed)
 Medications:  Added new medication of Prenatal + DHA 28 mg iron- 975 mcg-200 mg combo pack (prenatal vit 91-iron-folic-dha) - Signed  Removed medication of * MULTIVITAMINS ORAL CAPSULE 1 by mouth once a day ; Route: BY MOUTH - Signed  Observations:  Added new observation of HEIGHT (CM): 154.31 cm (09/25/2020 21:05)  Added new observation of OBALERTNOTES: PCP: Dr. Ernest Haber in [place]  myriad faminly history screening done on 06/09/19, 06/14/20  plan: she does not meet current guidelines for this test. She declined this test today.     Date: 10/02/20  GM:[id number] visit  prenatal bloodwork done  on prog support  [facility] - 10/15/20 @ 10am, arrive @ 9:30am  Other c/o:  G4P2  SAB 2013  NSVD 2014/2017 2 sons    LMP:08/24/20  Last mammogram : 07/23/20 birads1  07/18/19   Last pap: 06/14/20 NL, HPV NEG  06/09/19, 07/30/17 wnl, 10/31/15 11/01/14 NILM, HPV Neg  Any history of abnormal pap  in 2002  Marital status: Married  Lives: w/ husband and  2 sons   Sexually active               current partner:   26 y  Birth control: none  HPV vaccine:  Y  ALLERGIES: NKDA  Medications: Metformin, prenatal vitamin, vit D, Allegra, WOMEN'S MVT, she works out side  all   adv pnv/dha and vit d   she was on metformin  she is done with with breast feeding.   GDM - diet control 2nd pregnancy in 2017    (09/25/2020 21:05)            Family Medical History    -- Negative history of Colon Cancer   -- Negative history of Breast Cancer    -- Negative history of Other Cancer   -- Negative history of Diabetes   -- Positive history of Hyperlipidemia   -- Negative history of Hypertension   -- Positive history of CAD   -- Negative history of Stroke         Hip Replacement? No  Knee Replacement? No  Pacemaker? No  Heart Murmur? No  Antibiotic prior to dental work? No      Trauma/Violence Hx   Pt feels safe. She has never been abused nor forced to have sex.        Vital Signs     Height:          60.75 in. (154.31 cm.)

## 2020-09-26 ENCOUNTER — Ambulatory Visit

## 2020-09-28 ENCOUNTER — Ambulatory Visit: Admitting: Women's Health

## 2020-09-28 ENCOUNTER — Ambulatory Visit

## 2020-09-28 LAB — HX PROGESTERONE: HX PROGESTERONE: 21.9 ng/mL

## 2020-09-28 LAB — HX SERUM HCG BETA SUBUNIT - QUANT: HX SERUM HCG BETA SUBUNIT - QUANT: 3529 m[IU]/mL (ref <5)

## 2020-10-02 ENCOUNTER — Ambulatory Visit

## 2020-10-02 ENCOUNTER — Ambulatory Visit: Admitting: Women's Health

## 2020-10-02 NOTE — Progress Notes (Signed)
 GYN Annual Visit         Vital Signs   Weight: 161 lb. (73.18 kg.)  Height:          60.75 in. (154.31 cm.)  Body Mass Index: 30.78  BSA:  1.72        Blood Pressure #1: 122 / 78 mm Hg (right arm)  Vitals taken by:  Terri Piedra. Nunzio Cobbs NP.....................October 02, 2020 3:17 PM    Pap: Normal Date: 06/14/2020    Pregnancy   Lot# [id number]  Expiration: 03/14/2022  Result: Positive  No Translator Needed  Brandolyn Shortridge H. Nunzio Cobbs NP.....................October 02, 2020 3:19 PM      History of Present Illness:    Chief Complaint: GYN Amenorrhea  HPI: Paige [name] is a 40 Years Old,Married,  Female who is a G 4, P 2, and LMP of 08/24/2020, last pap smear NL (06/14/2020 8:23:00 AM) , last hpv NEGATIVE (06/14/2020 8:23:00 AM) who uses none for birth control.      Date: 10/02/20  UJ:[id number] visit  prenatal bloodwork done except glucose, hiv, hep a, varicella - will go for this on 1/31  on prog support  [facility] - 10/15/20 @ 10am, arrive @ 9:30am  Other c/o:  G4P2  SAB 2013  NSVD 2014/2017 - 2 sons  on metformin, gdm in prior preg - reports prescribed by dr Dorna Bloom  no pain/bleeding    LMP:08/24/20  Last mammogram : 07/23/20 birads1  07/18/19   Last pap: 06/14/20 NL, HPV NEG  06/09/19, 07/30/17 wnl, 10/31/15 11/01/14 NILM, HPV Neg  Any history of abnormal pap  in 2002  Marital status: Married  Lives: w/ husband and  2 sons   Sexually active    yes           current partner:   8+ y  Birth control: none  HPV vaccine:  Y    GYN History:   Patient's Age: 40 Years Old  Gravida: 4  Para: 2  Prev C-Section: 0  NSVD:  2  TAB:  0  Spont. Ab: 1  Ectopic Preg: 0  Living Children: 2  Mult. Births: 0  Menarche (yrs):  7  Length of period: 3-4    Sexual Health Sexually Active? Yes  Satisfied with Sex? Yes  Partners Female  Multiple Partners No  History of STD's? yes  Desire Pregnancy? Yes  Current Contraceptive:   None    Current Problems - Reviewed Today  PREGNANCY TEST POSITIVE (ICD-V72.42) (ICD10-Z32.01)  ENCOUNTER FOR ANTENATAL SCREENING FOR UNCERTAIN  DATES (ICD10-Z36.87)  AMENORRHEA, SECONDARY, R/O PREGNANCY (ICD-626.0) (ICD10-N91.1)  VESICULAR SKIN LESIONS (ICD-782.1) (ICD10-R23.8)  CERVIX, SCREENING FOR MALIGNANT NEOPLASM (ICD-V76.2) (FAO13-Y86.4)  ANNUAL GYNECOLOGICAL EXAMINATION (ICD-V72.3) (ICD10-Z01.419)  DEVIATED NASAL SEPTUM (ICD-470) (ICD10-J34.2)  ALLERGY, 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) (ICD10-T78.40xA)  POST-NASAL DRIP (ICD-784.91) (ICD10-R09.82)  VITAMIN D 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) (ICD10-E55.9)  OBESITY, BMI 30-34.9, ADULT (ICD-278.00) (ICD10-E66.9)  NEVUS: L SIDE  , 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) (ICD10-D22.9)  HX OF DIABETES MELLITUS, GESTATIONAL (ICD-V13.29) (ICD10-Z86.32)  HYPERLIPIDEMIA (ICD-272.4) (ICD10-E78.5)  PREVENTIVE HEALTH CARE (ICD-V70.0) (ICD10-Z00.00)  ECZEMA (ICD-692.9) (ICD10-L30.9)      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)    Current Medications - Reviewed Today  METFORMIN HCL 500 MG ORAL TABLET (METFORMIN HCL) take one by mouth twice daily for  polycystic ovary.; Route: ORAL  VITAMIN D 2000 UNIT ORAL CAPSULE (CHOLECALCIFEROL) Take one by mouth daily for bones/low vitamin D; Route: ORAL  ZYRTEC ALLERGY 10 MG ORAL TABLET (CETIRIZINE HCL) 1 by mouth daily for allergies; Route: ORAL  FLONASE  ALLERGY RELIEF 50 MCG/ACT NASAL SUSPENSION (FLUTICASONE PROPIONATE) Use 1 spray twice daily each nostril for allergic rhinitis.; Route: NASAL  CLOTRIMAZOLE-BETAMETHASONE 1-0.05 % EXTERNAL CREAM (Clotrimazole-Betamethasone) apply to affected area two times daily for fungal rash; Route: EXTERNAL  Prometrium 200 mg capsule (progesterone micronized) Take 1 capsule twice a day insert 1 tablet into the vagina twice daily through [redacted] weeks gestation(DO NOT TAKE by mouth)  Prenatal + DHA 28 mg iron- 975 mcg-200 mg combo pack (prenatal vit 91-iron-folic-dha)     Current Allergies - Reviewed Today  * NO KNOWN DRUG  ALLERGIES.        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 - wil be going to [employer] degree    Risk Factors  Tobacco Use:  never smoked  Passive smoke exposure: No  Alcohol Use:  no  Substance Abuse:  no  Caffeine (drinks/day):  2-3  Sun exposure:  frequently  Exercise (times/week):  yes  Seatbelt use (%):  100  Exercise Comments: walking - AND ACTIVE AT WORK    Family Medical History    -- Negative history of Colon Cancer   -- Negative history of Breast Cancer    -- Negative history of Other Cancer   -- Negative history of Diabetes   -- Positive history of Hyperlipidemia   -- Negative history of Hypertension   -- Positive history of CAD   -- Negative history of Stroke       Exercise: Yes  Times per week: yes  Exercise Comments:  walking - AND ACTIVE AT WORK  OB/GYN History   G 4     T 2     P 0     CS 0     NSVD 2     SAB 1     Ect 0     Live 2     Mult 0  Menarche (yrs):  7     Length of period: 3-4     Contraceptive History: None      Hip Replacement? No  Knee Replacement? No  Pacemaker? No  Heart Murmur? No  Antibiotic prior to dental work? No    Sexual Health  Sexually Active? Yes  Satisfied with Sex? Yes  Partners Female  Multiple Partners No  History of STD's? yes  Sexual Health Comments: sees Dr Dorna Bloom  Violence Screening   Do you feel safe in your current relationship? Yes  Is a partner from a previous relationship making you feel unsafe now? No  Have you ever been hit, kicked, puched or otherwise hurt by someone? No  Have you ever been forced to have sex when you don't want to?  No  Trauma/Violence Hx   Pt feels safe. She has never been abused nor forced to have sex.          Past Pregnancy History  Gravida:  4     Term Births:  2     Premature Births: 0     Living Children: 2     Para:   2     Mult. Births:  0     Prev C-Section: 0     Aborta:  1     Elect. Ab:  0     Spont. Ab:  1     Ectopics:  0    Pregnancy # 1     Delivery date:   2013     Delivery type:   SAB    Pregnancy # 2     Delivery date:   08/29/2013     Weeks Gestation: 40 1/2      Delivery type:   NSVD     Delivery location:   [place]     Infant Sex:  Female     Birth weight:  7-1     Comments:  IUI, IOL for AFP  she had prog supp    Pregnancy # 3     Delivery date:   06/08/2016     Weeks Gestation: 39     Preterm labor:   no     Delivery type:   NSVD     Anesthesia type:   epidural     [facility] location[facility]   [facility]     Infant Sex:  Female     Birth weight:  6-12     Comments:  IOL with cervidil and pitocin  diet control GDM    Pregnancy # 4     Comments:  on prog support    Review of Systems   General: Denies fevers, chills, sweats, anorexia, fatigue, malaise, weight loss,headache.   Cardiovascular: Denies chest pains, palpitations, syncope.   Respiratory: Denies cough, dyspnea, wheezing.   Gastrointestinal: Denies nausea, vomiting, diarrhea, constipation, change in bowel habits, abdominal pain.   Genitourinary: Complains of amenorrhea. Denies see HPI, vaginal discharge, incontinence, dysuria, hematuria, urinary frequency, menorrhagia, abnormal vaginal bleeding, pelvic pain.   Musculoskeletal: Denies back pain, joint pain.   Neurologic: Denies syncope, vertigo, headache.   Psychiatric: Denies depression, anxiety.   All other pertinent systems were reviewed and are negative       Physical Exam     Constitutional: Alert, no acute distress, well hydrated, well developed, well nourished, appropriate dress,non-ill appearing.   Neck: supple, no adenopathy, no masses, thyroid normal size, no thyroid  tenderness or nodules,.   Breasts: no asymmetry, no skin changes, no nipple discharge,no masses,no tenderness.   Cardiac: RRR, no murmurs.   Pulmonary: No respiratory distress, no accessory muscle use, clear to auscultation.   Abdomen: nondistended, nontender, no guarding, normal BS, no hepatosplenomegaly, no hernias, no CVA tenderness bilaterally.   Vulva: Normal appearance, normal hair distribution, no lesions or masses.   Urethra: Normal, no masses, non-tender, no discharge.   Vagina: Normal, rugated, physiologic discharge, no lesions, no masses, adequate pelvic support.   Cervix: Normal, no motion tenderness, no lesions.   Uterus: smooth, mobile, non-tender, adequate support, firm, no prolapse, average size.   Adnexa: Normal, no masses, mobile, nontender.   Rectum: EXTERNAL HEMORRHOIDS.   Chlamydia culture done: Yes  GC culture done: Yes        Assessment and Plan:      ~ AMENORRHEA, SECONDARY, R/O PREGNANCY:    normal exam today.  cultures and urine sent out today.  to have remainder of prenatal bw w/ ob US on 10/15/20.  I will call with results.  initial OB ppw given to pt.  reviewed to continue on PNV, vit d.  reviewed covid/zika precautions for her and husb.  briefly reviewed exercise/diet for pregnancy.            Patient Instructions:  1)  call if any pain, bleeding, or other concerns  2)  obus/bw on 09/27/20  3)  sch telehealth week of 10/22/20    Orders:  Added new Service order of Patient Encounter ([id number]) - Signed  Added new Service order of Pregnancy Test ([id number]) - Signed  Added new Test order of Varicella-Zoster AB (VZA) - Signed  Added new Test order of HIV (HIV) - Signed  Added new Test order of HEPA - Hepatitis Panel (HEPA) - Signed  Added new Test order of GLU -Glucose (GLUC) - Signed  Added new Test order of GENP -Genprobe (GC&CHLM) (GENP) - Signed  Added new Test order of GET - Genital Culture (GET) - Signed  Added new Test order of GLYCO - A1C** (GLYCO.) - Signed  Added new Service order  of OV Est Level III ([id number]) - Signed          ]Dr. Dorna Bloom provided supervision for this service and is in agreement with the assessment and plan as stated above.     signed Ferd Hibbs, NP    signed Richarda Osmond. Dorna Bloom MD

## 2020-10-03 ENCOUNTER — Ambulatory Visit: Admitting: Women's Health

## 2020-10-03 LAB — HX GENPROBE (CHLAM/GC AMPLIFIED)
HX GENPROBE CHLAMYDIA-AMPLIFIED: NOT DETECTED
HX GENPROBE GC AMPLIFIED: NOT DETECTED

## 2020-10-03 NOTE — Telephone Encounter (Signed)
 Phone Note -       Initial call taken by: Nakeya Adinolfi H. Nunzio Cobbs NP,  October 03, 2020 9:00 AM  Initial Details of Call:  BV on GET        Follow-up #1  Details: Spoke w/ pt  reviewed BV seen on culture  will tx after 12 weeks  reviewed to avoid soaps, thongs, tight fitting underwear, panty liners, or other irritants  wear loose cotton underwear only   she verbalized understanding of the above, all questions answered.      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 No  Knee Replacement? No  Pacemaker? No  Heart Murmur? No  Antibiotic prior to dental work? No      Trauma/Violence Hx   Pt feels safe. She has never been abused nor forced to have sex.

## 2020-10-15 ENCOUNTER — Ambulatory Visit: Admitting: Women's Health

## 2020-10-15 ENCOUNTER — Ambulatory Visit

## 2020-10-15 LAB — HX HIV ANTIGEN & ANTIBODY SCREEN: HX HIV ANTIGEN & ANTIBODY SCREEN: NONREACTIVE

## 2020-10-15 LAB — HX ACUTE HEPATITIS PANEL
HX HEPATITIS A VIRAL ANTIBODY IGM: NONREACTIVE
HX HEPATITIS B CORE ANTIBODY IGM: NONREACTIVE
HX HEPATITIS B SURFACE ANTIGEN: NEGATIVE
HX HEPATITIS C VIRAL ANTIBODY: NONREACTIVE

## 2020-10-15 LAB — HX GLUCOSE: HX GLUCOSE: 85 mg/dL (ref 70.0–110.0)

## 2020-10-15 LAB — HX GLYCOHEMOGLOBIN
HX ESTIMATED AVERAGE GLUCOSE: 105 mg/dL
HX HEMOGLOBIN A1C: 5.3 % (ref <=5.6)

## 2020-10-15 NOTE — Telephone Encounter (Signed)
 Phone Note -       Initial call taken by: Miley Blanchett H. Nunzio Cobbs NP,  October 15, 2020 7:44 PM  Initial Details of Call:  Hi Luana,    Would you be able to schedule this patient an [place] on 10/22/20?  Any time, [facility], order is in her chart.    EGA 8w 2d  follow up pregnancy [place] for subchorionic hematoma    Thank you,  Jodie Leiner      Follow-up #1  Details: [facility] [place] 2/7 @1230    By: Oletta Cohn ~ October 16, 2020 10:07 AM        Orders:  Added new Test order of US-Pregnancy Followup/need instruction (UPREG/FU) - Signed

## 2020-10-15 NOTE — Telephone Encounter (Signed)
 Phone Note -       Initial call taken by: Jamiya Nims H. Nunzio Cobbs NP,  October 15, 2020 7:40 PM  Initial Details of Call:  spoke w/ pt -  reviewed   twin pregnancy  EDD: 05/31/21            EGA : 7 weeks 3 days            Fetal Heart Rate: 166 bpm, Regular rhythm            Crown-Rump Length: 1.27 cm      Gestational Sac Definition: Normal      Yolk Sac :Normal       Amniotic Fluid Volume: Normal            Survey Visible Fetal/Placental Structure: Visualization limited due to      gestational age            12 B            EDD: 05/31/21            EGA : 7 weeks 2 days            Fetal Heart Rate: 166 bpm, Regular rhythm            Crown-Rump Length: 1.16 cm      Gestational Sac Definition: Normal      Yolk Sac :Normal       Amniotic Fluid Volume: Normal            Survey Visible Fetal/Placental Structure: Visualization limited due to      gestational age            Maternal Uterus and Adnexa: Perigestational bleed measuring 0.7 x 0.4      x 0.5 cm. Probable corpus luteum cyst in right ovary. Left ovary is      unremarkable.            Impression: Twin intrauterine gestations with size and dates as given      above. They appear to be diamniotic and monochorionic.    advised on pelvic rest and will repeat US in 7-10 days   she verbalized understanding of the above, all questions answered.        EDC Calculations   EDC Confirmation:     [facility]: 05/31/2021         Problems added or changed during this visit: SUBCHORIONIC HEMATOMA, ANTEPARTUM, UNSPECIFIED TRIMESTER (DGU-440.34) (ICD10-O43.899)          Problems:  Added new problem of SUBCHORIONIC HEMATOMA, ANTEPARTUM, UNSPECIFIED TRIMESTER (VQQ-595.63) (ICD10-O43.899)

## 2020-10-17 ENCOUNTER — Ambulatory Visit

## 2020-10-18 ENCOUNTER — Ambulatory Visit: Admitting: Family Medicine

## 2020-10-18 LAB — HX VARICELLA AB IGG: HX VARICELLA AB IGG: 601.8 {index} (ref >=165.00)

## 2020-10-22 ENCOUNTER — Ambulatory Visit: Admitting: Women's Health

## 2020-10-22 ENCOUNTER — Ambulatory Visit

## 2020-10-22 ENCOUNTER — Ambulatory Visit: Admitting: Obstetrics & Gynecology

## 2020-10-22 NOTE — Telephone Encounter (Signed)
 Phone Note -       Initial call taken by: Chanson Teems H. Nunzio Cobbs NP,  October 22, 2020 2:45 PM  Initial Details of Call:  Baby B            EGA by initial exam: 8 weeks 3 days      EGA by today's exam: 8 weeks 5 days            Fetal Heart Rate: 179 bpm, regular rhythm            Crown-Rump Length: 2.1 cm      Gestational Sac Definition: Normal      Yolk Sac :Normal       Amniotic Fluid Volume: Normal            Survey Visible Fetal/Placental Structure: Visualization limited due to      gestational age                  Maternal Uterus and Adnexa: There are 2 perigestational bleeds now      seen measuring 1.5 x 1.8 x 0.5 cm on the left and 1.8 x 1.3 x 0.4 cm      on the right. Corpus luteum cyst in the right ovary. Left ovary is      unremarkable.            Impression: Twin monochorionic monoamniotic intrauterine gestations      with size and dates as given above. 2 small perigestational      implantation bleeds are noted.      Follow-up #1  Details: amended report  monochorionic diamniotic  By: Terri Piedra. Nunzio Cobbs NP ~ October 22, 2020 7:41 PM    Details: spoke w/ pt  reviewed [place] result - discussed diamniotic monochorionic  advised on pelvic rest   will have mfm consut for twins/ama  telehealth tomorrow  By: Terri Piedra. Nunzio Cobbs NP ~ October 23, 2020 10:13 AM           Global Obstetrical Plan: [name],P2,08/29/13 NVSD Boy, NSVD 2017 boy) DIAMNIOTIC MONOCHORIONIC TWINS  [xx] sono for dates 10/15/20 - twins, small perigest bld - adv pelvic  [[x]  f/u sono - perigest blds persisi, adv pelvic rest  [xx] Flu                                                  [xxx] Tdap   [dd] EDC = 05/31/21 by LMP  [xxx] NIPT -  [xxx] GS for AMA  [xxx] NT Scan  [xx]MSAFP only=  [xx] 18 wks FS  [xx] GS for AMA in 3rd tri -   HGBEG- Neg  HSV 1&2- neg in past  Hep panels-   Rubella -   varicella-   Parvavirus -unknown   Toxo - Neg  Adv otc ca++  blood type - A+  CF,SMA,Fragile X- [dd] Counsyl drawn 10/31/15-NEGATIVE  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

## 2020-10-22 NOTE — Progress Notes (Signed)
 [facility] OBGYN at [facility]   [address]   [place], [place] [address]  Office: [phone] Fax: [phone]  [facility]  Date:   October 22, 2020                                                      New Patient:____XXX_______   Follow-up:____________    [place] MR#: ___________  Patient Information:   Name: Paige [name]       Phone:[phone]       Cell:  Address: [address]      [place], [place]        Zip:  [address]  D.O.B.: 1980/12/04  SS#: [id number]                                                                                                                                                                                                                                                 Insurance:  BC.HMOB                   Insurance ID:   [id number]                                                                                         Provider Information:   Name:      Dorna Bloom  Phone/Fax:  Address:                                           City:                                    Zip:  Building control surveyor (check all that apply):     _XXX_ 1st Trimester Screen - NT scan   _XXX_ - OB consultation for AMA, monochorionic diamniotic twins  _XXX__ Genetics for AMA  _XXX__ 18 wk anatomy scan ( 18 wks on _________)    _XXX_ Schedule Follow Up US if indicated    Preferred Site: ___XXX__ [facility]  __________[place]  Interpreter needed:                  no                 Language:  Medical Information:   LMP:08/24/2020     EDC:  05/31/21           Blood Type:       A+          Multiple Gestation:   yes  OB History : G 4 P 2   SAB     TAB       Ectopic   Reason for Appointment/Comments:   Please fax these records: __ All ultrasounds(current pregnancy) ___ Current ABO   ___Down Syndrome Screen ( pending / declined) ___ CBC w/MCV( pending/declined)   ___ CF ( pending/declined)___ Sickle cell/ Hgb  Electrophoresis (pending/ decline)   ___ Alfonzo Feller ( pending/ decline) * if any positives, include Fob's screening results     [place] internal use only:   Date of appnt :                    Time of GC:                 Time of U/S:            Date scheduled :   OB office confirmation:         spoke to:                    Interpreter:                PSC initials:  Follow up:                          Initials:                           Comments:   Follow up:                          Initials:                           Comments:   Follow up:                          Initials:  Comments:

## 2020-10-22 NOTE — Progress Notes (Signed)
 [facility] OBGYN at [facility]   [address]   [place], [place] [address]  Office: [phone] Fax: [phone]      October 22, 2020        Paige [name]  [address]  [place], [place]  [address]    To Whom It May Concern,    Paige [name] is currently a patient receiving care at our clinic.  Due to the nature of her condition, it is medically advised that she avoid lifting over 15lbs for the foreseeable future.          Sincerely,         Ferd Hibbs, NP

## 2020-10-22 NOTE — Progress Notes (Signed)
 [facility] OBGYN at [facility]   [address]   [place], [place] [address]  Office: [phone] Fax: [phone]      October 22, 2020        MARELIN TAT  [address]  [place], [place]  [address]    To Whom It May Concern,    Birdena Kingma is currently a patient receiving care at our clinic.  Due to the nature of her condition, it is medically advised that she avoid traveling by air for the foreseeable future.          Sincerely,         Ferd Hibbs, NP

## 2020-10-24 ENCOUNTER — Ambulatory Visit

## 2020-10-24 NOTE — Progress Notes (Signed)
 Receipt of: OB Initial Visit - Phone    The following were sent to 'Paige [name]' at [email or web address] on 10/29/2020 8:11:52 PM:     - Secure message created from ACM template     - Attachment created from ACM template

## 2020-10-24 NOTE — Progress Notes (Signed)
 Problems:  Added new problem of TWIN PREGNANCY, ANTEPARTUM, UNSPECIFIED TRIMESTER (ICD-651.03) (ICD10-O30.009)  Added new problem of SUPERVISION OF ELDERLY MULTIGRAVIDA, 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) (ICD10-O09.523)  Added new problem of SUPERVISION OF ELDERLY MULTIGRAVIDA, 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)  Added new problem of SUPERVISION OF ELDERLY MULTIGRAVIDA, 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) (ICD10-O09.521)  Observations:  Added new observation of OBCOMMENTS: [name],P2, 2014 NVSD Boy, NSVD 2017 boy) DIAMNIOTIC MONOCHORIONIC TWINS  [dd] sono for dates 10/15/20 - twins, small perigest bld - adv pelvic  [dd] f/u sono - perigest blds persisi, adv pelvic rest  [xx] Flu          [xx] covid vax                  [xxx] Tdap   [dd] EDC = 05/31/21 by LMP  [xxx] NIPT -  [xxx] GS for AMA  [xxx] NT Scan-w/ check [facility]  [xx]MSAFP only=  [xx] 18 wks FS  [xx] GS for AMA in 3rd tri -   HGBEG- Neg  HSV 1&2- neg in past  Hep panels-neg 10/15/20   Rubella - imm 10/15/20  varicella- imm 10/15/20  Parvavirus -unknown   Toxo - Neg 10/15/20  Adv otc ca++  blood type - A+  [dd]CF,SMA,Fragile X- [dd] Counsyl drawn 10/31/15-NEGATIVE  [xxx] Natera-274  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   (10/24/2020 15:09)             Problems added or changed during this visit: SUPERVISION OF ELDERLY MULTIGRAVIDA, 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) (ICD10-O09.521)  SUPERVISION OF ELDERLY MULTIGRAVIDA, 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)  SUPERVISION OF ELDERLY MULTIGRAVIDA, 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) (ICD10-O09.523)  TWIN PREGNANCY, ANTEPARTUM, UNSPECIFIED TRIMESTER (ICD-651.03) (ICD10-O30.009)      Global Obstetrical Plan: [name],P2, 2014 NVSD Boy, NSVD 2017 boy) DIAMNIOTIC MONOCHORIONIC TWINS  [dd] sono for dates 10/15/20 - twins, small perigest bld - adv  pelvic  [dd] f/u sono - perigest blds persisi, adv pelvic rest  [xx] Flu          [xx] covid vax                  [xxx] Tdap   [dd] EDC = 05/31/21 by LMP  [xxx] NIPT -  [xxx] GS for AMA  [xxx] NT Scan-w/ check [facility]  [xx]MSAFP only=  [xx] 18 wks FS  [xx] GS for AMA in 3rd tri -   HGBEG- Neg  HSV 1&2- neg in past  Hep panels-neg 10/15/20   Rubella - imm 10/15/20  varicella- imm 10/15/20  Parvavirus -unknown   Toxo - Neg 10/15/20  Adv otc ca++  blood type - A+  [dd]CF,SMA,Fragile X- [dd] Counsyl drawn 10/31/15-NEGATIVE  [xxx] Natera-274  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

## 2020-10-24 NOTE — Progress Notes (Signed)
 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 [facility]     FOB Information      Husband/Father of baby: Linette Gunderson     FOB occupation Chef     Phone: [phone]     FOB Comments: 02/02[birth year]1979    Menstrual History      LMP (date): 08/24/2020     EDC by LMP: 05/31/2021     LMP - Character: normal     Menarche: 7 years     Menses interval: 30 days     Menstrual flow 3-4 days     On BCP's at conception: no     Date of positive (+) home preg. test: 09/29/2020     Pre Pregnancy Weight: 160 lbs.     Symptoms since LMP: amenorrhea, nausea, fatigue, tender breasts    Prenatal Visit   EDC Confirmation:     EDC by LMP: 05/31/2021      Vital Signs   Weight:Unable to weigh  Weight: 161 lb. (73.18 kg.)  Height:          60.75 in. (154.31 cm.)  Body Mass Index: 30.78  BSA:  1.72      Patient in pain? N      Blood Pressure #1: 122 / 78 mm Hg (right arm)  Nursing Comments: This is the most recent BP. Unable to take BP due to telehealth visit. Pt has no sxs of abnormal blood pressure    Vitals taken by:  Wendall Papa RN....................Marland KitchenFebruary  9, 2022 5:35 PM      [facility] Calculations   [facility] Confirmation:     EDC by LMP: 05/31/2021      Past Pregnancy History      Gravida:  4     Term Births:  2     Premature Births: 0     Living Children: 2     Para:   2     Mult. Births:  0     Prev C-Section: 0     Aborta:  1     Elect. Ab:  0     Spont. Ab:  1     Ectopics:  0    Pregnancy # 1     Delivery date:   2013     Delivery type:   SAB    Pregnancy # 2     Delivery date:   08/29/2013     Weeks Gestation: 40 1/2      Delivery type:   NSVD     Delivery location:   [place]     Infant  Sex:  Female     Birth weight:  7-1     Comments:  IUI, IOL for AFP  she had prog supp    Pregnancy # 3     Delivery date:   06/08/2016     Weeks Gestation: 39     Preterm labor:   no     Delivery type:  NSVD     Anesthesia type:   epidural     Delivery location:   [facility]     Infant Sex:  Female     Birth weight:  6-12     Comments:  IOL with cervidil and pitocin  diet control GDM    Pregnancy # 4     Comments:  on prog support    Current Problems (No new problems added) - Reviewed Today  TWIN PREGNANCY, ANTEPARTUM, UNSPECIFIED TRIMESTER (ICD-651.03) (ICD10-O30.009)  [redacted] WEEKS GESTATION OF PREGNANCY (ICD-V28.9) (ICD10-Z3A.08)  VESICULAR SKIN LESIONS (ICD-782.1) (ICD10-R23.8)  CERVIX, SCREENING FOR MALIGNANT NEOPLASM (ICD-V76.2) (ZOX09-U04.4)  ANNUAL GYNECOLOGICAL EXAMINATION (ICD-V72.3) (ICD10-Z01.419)  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521)      SUPERVISION OF ELDERLY MULTIGRAVIDA, 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)  SUPERVISION OF ELDERLY MULTIGRAVIDA, 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) (ICD10-O09.523)    Current Medications (No new medications added) - Reviewed Today  METFORMIN HCL 500 MG ORAL TABLET (METFORMIN HCL) take one by mouth twice daily for  polycystic ovary.; Route: ORAL  VITAMIN D 2000 UNIT ORAL CAPSULE (CHOLECALCIFEROL) Take one by mouth daily for bones/low vitamin D; Route: ORAL  ZYRTEC ALLERGY  10 MG ORAL TABLET (CETIRIZINE HCL) 1 by mouth daily for allergies; Route: ORAL  FLONASE ALLERGY RELIEF 50 MCG/ACT NASAL SUSPENSION (FLUTICASONE PROPIONATE) Use 1 spray twice daily each nostril for allergic rhinitis.; Route: NASAL  CLOTRIMAZOLE-BETAMETHASONE 1-0.05 % EXTERNAL CREAM (Clotrimazole-Betamethasone) apply to affected area two times daily for fungal rash; Route: EXTERNAL  Prometrium 200 mg capsule (progesterone micronized) Take 1 capsule twice a day insert 1 tablet into the vagina twice daily through [redacted] weeks gestation(DO NOT TAKE by mouth)  Prenatal + DHA 28 mg iron- 975 mcg-200 mg combo pack (prenatal vit 91-iron-folic-dha)     Current Allergies (No new allergies added) - Reviewed Today  * NO KNOWN DRUG  ALLERGIES.        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 - wil be going to [employer] degree    Risk Factors  Tobacco Use:  never smoked  Passive smoke exposure: No  Alcohol Use:  no  Substance Abuse:  no  Caffeine (drinks/day):  1  Sun exposure:  frequently  Exercise (times/week):  yes  Seatbelt use (%):  100  Exercise Comments: walking - AND ACTIVE AT WORK    Done  Family Medical History    -- Negative history of Colon Cancer   -- Negative  history of Breast Cancer    -- Negative history of Other Cancer   -- Negative history of Diabetes   -- Positive history of Hyperlipidemia   -- Negative history of Hypertension   -- Positive history of CAD   -- Negative  history of Stroke       Exercise: No  Times per week: yes  Exercise Comments:  walking - AND ACTIVE AT WORK      Hip Replacement? No  Knee Replacement? No  Pacemaker? No  Heart Murmur? No  Antibiotic prior to dental work? No    Violence Screening   Do you feel safe in your current relationship? Yes  Is a partner from a previous relationship making you feel unsafe now? No  Have you ever been hit, kicked, puched or otherwise hurt by someone? No  Have you ever been forced to have sex when you don't want to? No  Trauma/Violence Hx   Pt feels safe. She has never been abused nor forced to have sex.          Genetic History      Genetic History Reviewed:     Maternal age:  62     Father of baby: Aoki Wedemeyer       Thalassemia:  mother: no father: no     Neural tube defect: mother: no father: no     Down's Syndrome: mother: no father: no     Tay-Sachs:  mother: no father: no     Sickle Cell Dz/Trait: mother: no father: no     Hemophilia:  mother: no father: no     Muscular Dystrophy: mother: no father: no     Cystic Fibrosis: mother: no father: no comments: Negative Carrier Status     Huntington's Dz: mother: no father: no     Mental Retardation: mother: no father: no     Fragile X:  mother: no father: no     Other Genetic or        Chromosomal Dz: mother: no father: no     Child with other        birth defect:  mother: no father: no     > 3 spont. abortions: mother: no     Hx of stillbirth:   mother: no    Infection Risk History      Infection Risk History Reviewed:     High Risk Hepatitis B: no     Immunized against Hepatitis B: no     Exposure to TB: no     Patient with history of Genital Herpes: no     Sexual partner with history of Genital Herpes: no     History of STD (GC, Chlamydia, Syphilis, HPV): no     Rash, Viral, or Febrile Illness since LMP: no     Exposure to Cat Litter: yes     Chicken Pox Immune Status: Hx of Disease: Immune     History of Parvovirus (Fifth Desease): no     Occupational Exposure to Children:  none    Environmental Exposures      Environmental Exposures Reviewed:     Xray Exposure since LMP: no     Chemical or other exposure: no     Medication, drug, or alcohol use since LMP: no    All other pertinent systems were reviewed and are negative       Physical Exam     Constitutional: Alert, no acute distress, well hydrated, well developed, well nourished, appropriate  dress,non-ill appearing.       Flowsheet View for Follow-up Visit     Estimated weeks of        gestation:  8 5/7     Weight:  161     Blood pressure: 122 / 78     Headache:  No     Nausea/vomiting: nausea     Edema:  0     Vaginal bleeding: no     Vaginal discharge: no     Fetal activity:  N/A     Labor symptoms: no     Cx Dilation:  0     Cx Effacement: 0%     Cx Station:  high     Taking prenatal vits? Y     Smoking:  n/a     Next visit:  4 wk     Comment:  40 yo G4P2 @ 8wks 5 d gest by sure LMP with TWINS  reports for NOB.Taking PNV/Vit D, C/o mild nausea, getting better  hx of GDM  PCOS- she takes metformin-when to stop?  sono-shows small SCH-will check at NT  Labs-WNL  NIPT-scheduled 11/08/20   declines-Natera-did Counsyl 176 panel-neg         Orders:  Added new Service order of Patient Encounter ([id number]) - Signed  Added new Service order of OB Est Pt 1st OB ([id number]) - Signed  Added new Service order of Post-Partum Screening ([id number]) - Signed      Education   1st Trimester Education   Sequential Screen #1,orientation to system,changes of pregnancy/self-care,fetal development,exercise/nutrition/wt gain,tobacco/alcohol/drugs,toxoplasmosis (cat/raw meat),warning signs - early pregnancy,emotional adjustments/sex,travel/seatbelts,environment/work hazards,HIV risks, Benefits of Breast Feeding,Cord Blood Banking,Discussed Delivery at [facility],Discussed Coverage Group,OB Education Package Given/Reviewed  2nd Trimester Education   Sequential Screen #2,breast vs. bottle feeding,childbirth class & tour,signs of preterm labor(PTL),contraception,fetal  development,nutrition,exercise,circumcision,car seat,preparation for baby,patient concerns/fears,Selection of Pediatrician            ]This real-time, interactive virtual Telehealth encounter was done by phone  This real-time, interactive virtual Telehealth encounter was done by phone  Two patient identifiers were used and confirmed.  The patient was at home.  My location: office  The patient has consented to this encounter type.  Total minutes spent: 45    This treatment is voluntary and a claim will be submitted to your insurance company for payment. You agree there are certain limitations in providing your care in this manner in lieu of an in person visit. Some virtual applications are not considered HIPAA compliant and therefore not secure. If a platform is not HIPAA compliant you agree to use of this application for this visit.    Patient presents during the COVID-19 pandemic / federally declared state of public health emergency. This service conducted via phone. Normal HIPAA rules have been waived and patient accepts and understands these conditions.         Problems added or changed during this visit: TWIN PREGNANCY, ANTEPARTUM, UNSPECIFIED TRIMESTER (ICD-651.03) (ICD10-O30.009)  [redacted] WEEKS GESTATION OF PREGNANCY (ICD-V28.9) (ICD10-Z3A.08)      Prenatal Visit Assessment and Plan: 40 yo G4P2 @ 8wks 5 d gest by sure LMP with TWINS  reports for NOB.Taking PNV/Vit D, C/o mild nausea, getting better  hx of GDM  PCOS- she takes metformin-when to stop?  sono-shows small SCH-will check at NT  Labs-WNL  NIPT-scheduled 11/08/20   declines-Natera-did Counsyl 176 panel-neg          Global Obstetrical Plan: [name],P2, 2014 NVSD Boy, NSVD 2017 boy) DIAMNIOTIC MONOCHORIONIC TWINS  [  dd] sono for dates 10/15/20 - twins, small perigest bld - adv pelvic  [dd] f/u sono - perigest blds persisi, adv pelvic rest  [dd] Flu-vax 06/2013       [dd] covid vax-boosted 06/2020                   [xxx] Tdap   [dd] EDC = 05/31/21 by  LMP  [xxx] NIPT- draw 11/08/20   [xxx] GS for AMA  [xxx] NT Scan-w/ check SCH-requested   [xx]MSAFP only @ 16 wks  [xx] 18 wks FS  [xx] GS for AMA in 3rd tri -   HGBEG- Neg  HSV 1&2- neg in past  Hep panels-neg 10/15/20   Rubella - imm 10/15/20  varicella- imm 10/15/20  Parvavirus -unknown   Toxo - Neg 10/15/20  Adv otc ca++  blood type - A+  [dd]CF,SMA,Fragile X- [dd] Counsyl drawn 10/31/15-NEGATIVE  [dd] Natera-274-declines   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        The Edinburgh Postnatal Depression Scale (EPDS)   Final Score:  0  The responses are how patient felt in the last 7 days   1.  I have been able to laugh and see the funny side of things.   Score on Question 1:  0  Score 0.  As much as I always could   Score 1.  Not quite so much as nowScore 2.  Definitely not so much now   Score 3.  Not at all   2.  I have looked forward with enjoyment to things.   Score on Question 2: 0  Score 0:  As much as I ever did   Score 1:  Slightly less than I used to   Score 2:  Significantly less than I used to   Score 3:  Hardly at all   3.  I have blamed myself unnecessarily when things went wrong.   Score on Question 3:  0  Score 3: Yes, most of the time   Score 2: Yes, some of the time   Score 1: Not very often   Score 0: No, never   4. I have been anxious or worried for no good reason.   Score on Question 4:  0  Score 0: No, not at all   Score 1: Hardly ever   Score 2: Yes, sometimes   Score 3: Yes, very often   5. I have felt scared or panicky without a good reason.   Score on Question 5:  0  Score 3: Yes, quite a lot   Score 2: Yes, sometimes   Score 1: No, not much   Score 0: No, not at all   6.  I sometimes feel overwhelmed by my responsibilities.   Score on Question 6:  0  Score 3: Yes, most of the time I haven't been able to cope at all   Score 2:  Yes, sometimes I haven't been coping as well as usual   Score 1: No, most of the time I have coped quite well   Score 0: No, I have been coping as well as ever   7.  I have been so unhappy that I have had difficulty sleeping.   Score on Question 7:  0  Score 3: Yes, most of the time   Score 2: Yes, sometimes   Score 1: Not very often   Score 0: No, not at all   8.  I have felt sad or miserable.   Score on Question 8: 0  Score 3: Yes, most of the time   Score 2: Yes, quiet often   Score 1: Not very often   Score 0: No, not at all   9.  I have been so unhappy that I have been crying.   Score on Question 9:  0  Score 3: Yes, most of the time   Score 2: Yes, sometimes   Score 1: Only occasionally   Score 0: No, never   10.  The thought of harming myself has occurred to me.   Score on Question 10:  0  Score 3: Yes, quiet often   Score 2: Sometimes   Score 1: Hardly ever   Score 0: Never

## 2020-10-26 ENCOUNTER — Ambulatory Visit: Admitting: Women's Health

## 2020-10-26 ENCOUNTER — Ambulatory Visit

## 2020-10-26 NOTE — Progress Notes (Signed)
 [facility] at [facility]   [address]   [place], [place] [address]  Office: [phone] Fax: [phone]      October 26, 2020        Tatjana Turcott  [address]  [place], [place]  [address]    To Whom It May Concern,  Katisha Shimizu (DOB: [birth year]) is currently a patient receiving care at our clinic.    Due to the nature of her condition, it is advised that she remain off her feet/sit for 15 minutes out of every 60-90 minutes.    Sincerely,         Ferd Hibbs, NP

## 2020-10-30 ENCOUNTER — Ambulatory Visit (HOSPITAL_BASED_OUTPATIENT_CLINIC_OR_DEPARTMENT_OTHER): Admitting: Orthopaedic Surgery

## 2020-10-30 NOTE — Telephone Encounter (Signed)
----   Converted from flag ----  ---- 10/30/2020 9:53 AM, Glade Nurse MD wrote:  I can't find her derm not in our system. She saw Dr. Jake Bathe. Can you see if you can track down that note?  Thanks!  ------------------------------

## 2020-10-31 ENCOUNTER — Ambulatory Visit (HOSPITAL_BASED_OUTPATIENT_CLINIC_OR_DEPARTMENT_OTHER)

## 2020-11-06 ENCOUNTER — Ambulatory Visit: Admitting: Women's Health

## 2020-11-06 NOTE — Telephone Encounter (Signed)
 Phone Note -       Initial call taken by: Amilia Vandenbrink H. Nunzio Cobbs NP,  November 06, 2020 9:05 AM  Initial Details of Call:  Damali scheduled Mayreli  11/21/20 @ 10am [address]  telehealth 11/19/20 @ 11:15  4/27 @ 10:30am [address]        Follow-up #1  Details: LM to call our office   [re: 11/21/20 @ 10am [address]  telehealth 11/19/20 @ 11:15  4/27 @ 10:30am [address]    ]    By: Terri Piedra. Nunzio Cobbs NP ~ November 06, 2020 9:07 AM           Prenatal Visit Assessment and Plan: 11/21/20 @ 10am [address]  telehealth 11/19/20 @ 11:15  4/27 @ 10:30am [address]         Global Obstetrical Plan: [name],P2, 2014 NVSD Boy, NSVD 2017 boy) DIAMNIOTIC MONOCHORIONIC TWINS  [dd] sono for dates 10/15/20 - twins, small perigest bld - adv pelvic  [dd] f/u sono - perigest blds persisi, adv pelvic rest  [dd] Flu-vax 06/2013       [dd] covid vax-boosted 06/2020                   [xxx] Tdap   [dd] EDC = 05/31/21 by LMP  [xxx] NIPT- draw 11/08/20   [xxx] GC for AMA - telehealth 11/19/20 @ 11:15  [xxx] NT Scan-w/ check [facility]- 11/21/20 @ 10am [address]  [xx]MSAFP only @ 16 wks  [xx] 18 wks FS - 4/27 @ 10:30am [address]  [xx] GS for AMA in 3rd tri -   HGBEG- Neg  HSV 1&2- neg in past  Hep panels-neg 10/15/20   Rubella - imm 10/15/20  varicella- imm 10/15/20  Parvavirus -unknown   Toxo - Neg 10/15/20  Adv otc ca++  blood type - A+  [dd]CF,SMA,Fragile X- [dd] Counsyl drawn 10/31/15-NEGATIVE  [dd] Natera-274-declines   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

## 2020-11-07 ENCOUNTER — Ambulatory Visit

## 2020-11-07 NOTE — Progress Notes (Signed)
 Observations:  Added new observation of OBCOMMENTS: [name],P2, 2014 NVSD Boy, NSVD 2017 boy) DIAMNIOTIC MONOCHORIONIC TWINS  [dd] sono for dates 10/15/20 - twins, small perigest bld - adv pelvic  [dd] f/u sono - perigest blds persisi, adv pelvic rest  [dd] Flu-vax 06/2013       [dd] covid vax-boosted 06/2020                   [xxx] Tdap   [dd] EDC = 05/31/21 by LMP  [xxx] NIPT- draw 11/08/20   [xxx] GC for AMA - telehealth 11/19/20 @ 11:15am  [xxx] NT Scan-w/ check [facility]- 11/21/20 @ 10am [address]  [xx]MSAFP only @ 16 wks  [xx] 18 wks FS - 4/27 @ 10:30am [address]  [xx] GS  in 3rd tri - will be scheduled after MFM  HGBEG- Neg  HSV 1&2- neg in past  Hep panels-neg 10/15/20   Rubella - imm 10/15/20  varicella- imm 10/15/20  Parvavirus -unknown   Toxo - Neg 10/15/20  Adv otc ca++  blood type - A+  [dd]CF,SMA,Fragile X- [dd] Counsyl drawn 10/31/15-NEGATIVE  [dd] Natera-274-declines   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   (11/07/2020 19:45)               Global Obstetrical Plan: [name],P2, 2014 NVSD Boy, NSVD 2017 boy) DIAMNIOTIC MONOCHORIONIC TWINS  [dd] sono for dates 10/15/20 - twins, small perigest bld - adv pelvic  [dd] f/u sono - perigest blds persisi, adv pelvic rest  [dd] Flu-vax 06/2013       [dd] covid vax-boosted 06/2020                   [xxx] Tdap   [dd] EDC = 05/31/21 by LMP  [xxx] NIPT- draw 11/08/20   [xxx] GC for AMA - telehealth 11/19/20 @ 11:15am  [xxx] NT Scan-w/ check [facility]- 11/21/20 @ 10am [address]  [xx]MSAFP only @ 16 wks  [xx] 18 wks FS - 4/27 @ 10:30am [address]  [xx] GS  in 3rd tri - will be scheduled after MFM  HGBEG- Neg  HSV 1&2- neg in past  Hep panels-neg 10/15/20   Rubella - imm 10/15/20  varicella- imm 10/15/20  Parvavirus -unknown   Toxo - Neg 10/15/20  Adv otc ca++  blood type - A+  [dd]CF,SMA,Fragile X- [dd]  Counsyl drawn 10/31/15-NEGATIVE  [dd] Natera-274-declines   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

## 2020-11-08 ENCOUNTER — Ambulatory Visit

## 2020-11-08 ENCOUNTER — Ambulatory Visit: Admitting: Obstetrics & Gynecology

## 2020-11-08 NOTE — Progress Notes (Addendum)
 OB Interval Visit        Flowsheet View for Follow-up Visit     Estimated weeks of        gestation:  10 6/7     Weight:  158     Blood pressure: 128 / 85     Urine Protein:  trace     Urine Glucose: NEG     Urine Nitrite:  negative     Headache:  No     Nausea/vomiting: No     Edema:  0     Vaginal bleeding: no     Vaginal discharge: no     FHR:   +/+     Fetal activity:  N/A     Labor symptoms: no     Smoking:  n/a     Next visit:  4 wk     Comment:  NATERA PANORAMA DRAWN TODAY-NIPT  declined Natara-14 or 274  nausea improve  M, MWF- 2 tablet pnv  fu vac in 06/2020  next on ov 3/25 or 3/28-ROB with MSAFP only    bx on left ring finger - muciprocin          Problems added or changed during this visit: [redacted] WEEKS GESTATION OF PREGNANCY (ICD-V28.9) (ICD10-Z3A.10)    Updated medication list:   * Flagyl 500 mg tablet (metronidazole) Take 1 tablet by mouth twice a day to start on 11/17/2020      Prenatal Visit Assessment and Plan: NATERA PANORAMA DRAWN TODAY-NIPT  declined Natara-14 or 274  nausea improve  M, MWF- 2 tablet pnv  fu vac in 06/2020  next on ov 3/25 or 3/28-ROB with MSAFP only    bx on left ring finger - muciprocin        Global Obstetrical Plan: [name],P2, 2014 NVSD Boy, NSVD 2017 boy) DIAMNIOTIC MONOCHORIONIC TWINS  [dd]sono for dates 10/15/20 - twins, small perigest bld - adv pelvic  [dd]f/u sono - perigest blds persisi, adv pelvic rest  [dd]EDC = 05/31/21 by LMP           [dd] covid vax-boosted 06/2020       [xxx]NIPT- draw 11/08/20-pending  [xxx] GC for AMA - telehealth 11/19/20 @ 11:15am  [xxx] NT Scan-w/ check [facility]- 11/21/20 @ 10am [address]  [xx]MSAFP only @ 16 wks           [dd] Flu-vax 06/2013  [xx] 18 wks FS - 4/27 @ 10:30am [address]  [xx] GS  in 3rd tri - will be scheduled after MFM  HGBEG- Neg                                  [xxx]Tdap  HSV 1&2- neg in past  Hep panels-neg 10/15/20   Rubella - imm 10/15/20  varicella- imm 10/15/20  Parvavirus -unknown   Toxo - Neg 10/15/20  Adv otc ca++  blood  type - A+  [dd]CF,SMA,Fragile X- [dd] Counsyl drawn 10/31/15-NEGATIVE  [dd]Natera-274-declines   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 (71.82 kg.)  Height:  60.75 in. (154.31 cm.)  Body Mass Index: 30.21  BSA:  1.71        Blood Pressure #1: 128 / 85 mm Hg (right arm)  Vitals taken by:  Jacques Earthly Christine....................Marland KitchenFebruary 24, 2022 8:12 AM      Urinalysis DipstickProtein: trace  Glucose: NEG  Leukocytes: negative  Nitrite: negative    Venipuncture   Venipuncture Completed  No Translator Needed  Jacques Earthly Saunemin....................Marland KitchenFebruary 24, 2022 8:12 AM    Current Problems - Reviewed Today  [redacted] WEEKS GESTATION OF PREGNANCY (ICD-V28.9) (ICD10-Z3A.10)  VESICULAR SKIN LESIONS (ICD-782.1) (ICD10-R23.8)  CERVIX, SCREENING FOR MALIGNANT NEOPLASM (ICD-V76.2) (DEK06-J49.4)  ANNUAL GYNECOLOGICAL EXAMINATION (ICD-V72.3) (ICD10-Z01.419)  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009)    Current Medications - Reviewed Today  METFORMIN HCL 500 MG ORAL TABLET (METFORMIN HCL) take one by mouth twice daily for  polycystic ovary.; Route: ORAL  VITAMIN D 2000 UNIT ORAL CAPSULE (CHOLECALCIFEROL) Take one by mouth daily for bones/low vitamin D; Route: ORAL  ZYRTEC ALLERGY 10 MG ORAL TABLET (CETIRIZINE HCL) 1 by mouth daily for allergies; Route: ORAL  FLONASE ALLERGY RELIEF 50 MCG/ACT NASAL SUSPENSION (FLUTICASONE PROPIONATE) Use 1 spray twice daily each nostril for allergic rhinitis.; Route: NASAL  CLOTRIMAZOLE-BETAMETHASONE 1-0.05 % EXTERNAL CREAM (Clotrimazole-Betamethasone) apply to affected area two times daily for fungal rash; Route: EXTERNAL  Prometrium 200 mg capsule (progesterone micronized) Take 1 capsule twice a day insert 1 tablet into the vagina twice daily through [redacted] weeks gestation(DO NOT TAKE by mouth)  Prenatal + DHA 28 mg iron- 975 mcg-200 mg combo pack (prenatal vit 91-iron-folic-dha)   * Flagyl 500 mg tablet (metronidazole) Take 1 tablet by mouth twice a day to start on 11/17/2020    Current Allergies (No new allergies added) - Reviewed Today  * NO KNOWN DRUG  ALLERGIES.        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 - wil be going to [employer] degree    Risk Factors  Tobacco Use:  never smoked  Passive smoke exposure: No  Alcohol Use:  no  Substance Abuse:   no  Caffeine (drinks/day):  1  Sun exposure:  frequently  Exercise (times/week):  yes  Seatbelt use (%):  100  Exercise Comments: walking -  AND ACTIVE AT WORK    Done  Family Medical History    -- Negative history of Colon Cancer   -- Negative history of Breast Cancer    -- Negative history of Other Cancer   -- Negative history of Diabetes   -- Positive history of Hyperlipidemia   -- Negative history of Hypertension   -- Positive history of CAD   -- Negative history of Stroke       Exercise: No  Times per week: yes  Exercise Comments:  walking - AND ACTIVE AT WORK      Hip Replacement? No  Knee Replacement? No  Pacemaker? No  Heart Murmur? No  Antibiotic prior to dental work? No      Trauma/Violence Hx   Pt feels safe. She has never been abused nor forced to have sex.          Best Working [facility]: 05/31/2021      Urinalysis (dipstick)   Leukocyte esterase: negative  Nitrite: negative  Protein: trace  Glucose: NEG        Orders:  Added new Service order of Patient Encounter ([id number]) - Signed  Added new Service order of OB Visit (CPT-00010) - Signed      Education       Medications:  Flagyl 500 mg tablet (metronidazole) Take 1 tablet by mouth twice a day to start on 11/17/2020  #14 tablet x 0   Entered and Authorized by: Malachy Chamber MD   Signed by: Malachy Chamber MD on 11/08/2020   Method used: Electronically to        [id number] [address], [place], [place] [address], Ph: [phone] Fax: [phone]     RxID: [id number]          ]

## 2020-11-08 NOTE — Progress Notes (Addendum)
 Receipt of: OB Interval Visit: week  10 6/7    The following were sent to Paige [name]' at [email or web address] on 11/12/2020 8:57:21 PM:     - Secure message created from [facility] template     - Attachment created from [facility] template

## 2020-11-13 ENCOUNTER — Ambulatory Visit: Admitting: Obstetrics & Gynecology

## 2020-11-13 ENCOUNTER — Ambulatory Visit

## 2020-11-13 NOTE — Telephone Encounter (Addendum)
 Internal Correspondence   Initial call taken by: Malachy Chamber MD,  November 13, 2020 10:18 AM  Type: Patient records  Summary of Call: dr Jake Bathe  called re: bx on hter right finger and ok  with 1% lidocaine                  Created By Malachy Chamber MD on 11/13/2020 at 10:18 AM    Electronically Signed By Malachy Chamber MD on 11/13/2020 at 10:19 AM

## 2020-11-19 ENCOUNTER — Ambulatory Visit

## 2020-11-19 NOTE — Progress Notes (Addendum)
Orders:  Added new Referral order of Otolaryngology Referral (OTOL) - Signed

## 2020-11-21 ENCOUNTER — Ambulatory Visit

## 2020-11-21 ENCOUNTER — Ambulatory Visit: Admitting: Specialist

## 2020-11-22 ENCOUNTER — Ambulatory Visit

## 2020-11-22 NOTE — Telephone Encounter (Addendum)
 Phone Note -       Initial call taken by: Wendall Papa RN,  November 22, 2020 9:18 AM  Initial Details of Call:  Spoke to pt. She had MFM with [facility] @ NT appt. WNL, no SCH seen. She has started baby ASAMFM done for risks of Moni-di twins, and Q2 wk fluid checks and monthly GS are scheduled at [address]NIPT is low risk. She is having twin girls.             Prenatal Visit Assessment and Plan: Spoke to pt. She had MFM with [facility] @ NT appt. WNL, no SCH seen. She has started baby ASAMFM done for risks of Moni-di twins, and Q2 wk fluid checks and monthly GS are scheduled at [address]NIPT is low risk. She is having twin girls.                         Created By Konrad Penta RN on 11/22/2020 at 09:18 AM    Electronically Signed By Konrad Penta RN on 11/22/2020 at 09:38 AM

## 2020-11-23 ENCOUNTER — Ambulatory Visit

## 2020-11-29 ENCOUNTER — Ambulatory Visit

## 2020-11-29 ENCOUNTER — Ambulatory Visit: Admitting: Dermatology

## 2020-12-01 ENCOUNTER — Other Ambulatory Visit (HOSPITAL_BASED_OUTPATIENT_CLINIC_OR_DEPARTMENT_OTHER): Admitting: Pediatrics

## 2020-12-05 ENCOUNTER — Encounter (HOSPITAL_BASED_OUTPATIENT_CLINIC_OR_DEPARTMENT_OTHER): Admitting: Obstetrics & Gynecology

## 2020-12-07 ENCOUNTER — Encounter (INDEPENDENT_AMBULATORY_CARE_PROVIDER_SITE_OTHER)

## 2020-12-10 LAB — HX SENSITIVE TSH W/O REFLEX

## 2020-12-11 ENCOUNTER — Encounter (INDEPENDENT_AMBULATORY_CARE_PROVIDER_SITE_OTHER)

## 2020-12-11 ENCOUNTER — Ambulatory Visit

## 2020-12-11 ENCOUNTER — Encounter (HOSPITAL_BASED_OUTPATIENT_CLINIC_OR_DEPARTMENT_OTHER)

## 2020-12-11 ENCOUNTER — Ambulatory Visit: Admitting: Obstetrics & Gynecology

## 2020-12-11 NOTE — Progress Notes (Signed)
 OB Interval Visit        Flowsheet View for Follow-up Visit     Estimated weeks of        gestation:  15 4/7     Weight:  165     Blood pressure: 110 / 70     Urine Protein:  negative     Urine Glucose: normal     Urine Nitrite:  negative     Headache:  No     Nausea/vomiting: No     Edema:  0     Vaginal bleeding: no     Vaginal discharge: no     Fundal height:    u     FHR:   140s/140s     Fetal activity:  N/A     Labor symptoms: no     Smoking:  n/a     Next visit:  4 wk     Comment:  msAFP drawn today  venipuncture done today    has [place] scheduled 4/7 @ 10am - [address]  every 2 weeks to check     [xxx]MSAFP only @ 16 wks           [dd] Flu-vax 06/2013  [xxx] 18 wks FS - 4/22 @ [facility]   [xxx] fetal echo- TBD @ fetal survey  [xxx] GS  in 3rd tri- Q4 wks          [xxx] 24 wk-02/12/21 @ 10:45am @[address]          [xxx] 28 wk-03/12/21 @ 1pm [address]          [xxx] 32 wk-04/09/21 @ 10:45am [address]          [xxx] 36 wk-05/06/21 @ 10:45am [address]     [xxx]Prog until 12 wks -stop 11/16/20  [xxx]h/o GDM, on Metformin in 1st trimester - she will stop now  plan for 1 hr glucola in 2 weeks    [xxx]BV on GET 10/02/20, tx after 12 weeks - she was tx;ed           Problems added or changed during this visit: [redacted] WEEKS GESTATION OF PREGNANCY (ICD-V28.9) (ICD10-Z3A.15)      Prenatal Visit Assessment and Plan: msAFP drawn today  venipuncture done today    has [place] scheduled 4/7 @ 10am - [address]  every 2 weeks to check     [xxx]MSAFP only @ 16 wks           [dd] Flu-vax 06/2013  [xxx] 18 wks FS - 4/22 @ [facility]   [xxx] fetal echo- TBD @ fetal survey  [xxx] GS  in 3rd tri- Q4 wks          [xxx] 24 wk-02/12/21 @ 10:45am @[address]          [xxx] 28 wk-03/12/21 @ 1pm [address]          [xxx] 32 wk-04/09/21 @ 10:45am [address]          [xxx] 36 wk-05/06/21 @ 10:45am [address]     [xxx]Prog until 12 wks -stop 11/16/20  [xxx]h/o GDM, on Metformin in 1st trimester - she will stop now  plan for 1 hr glucola in 2 weeks    [xxx]BV on GET 10/02/20, tx after 12 weeks  - she was tx;ed         Global Obstetrical Plan: [name],P2, 2014 NVSD Boy, NSVD 2017 boy) DIAMNIOTIC MONOCHORIONIC TWINS  [dd]sono for dates 10/15/20 - twins, small perigest  bld - adv pelvic  [dd]f/u sono - perigest blds persisi, adv pelvic rest  [dd]EDC = 05/31/21 by LMP           [dd] covid vax-boosted 06/2020       [dd]NIPT- Low risk, 2 girls   [dd] GC for AMA - telehealth 11/19/20   [dd] NT Scan-w/ MFM check [facility]- 11/21/20 @ [address]-NL NT both Mono-Di twins, no SCH seen, start baby ASA, Q  [xxx]MSAFP only @ 16 wks           [dd] Flu-vax 06/2013  [xxx] 18 wks FS - 4/22 @ [facility]   [xxx] fetal echo- TBD @ fetal survey -   [xxx] GS  in 3rd tri- Q4 wks  [xx]  [place] scheduled 4/7 @ 10am - [address]  every 2 weeks to check          [xxx] 24 wk-02/12/21 @ 10:45am @[address]          [xxx] 28 wk-03/12/21 @ 1pm [address]          [xxx] 32 wk-04/09/21 @ 10:45am [address]          [xxx] 36 wk-05/06/21 @ 10:45am [address]   HGBEG- Neg                                  [xxx]Tdap  HSV 1&2- neg in past  Hep panels-neg 10/15/20   Rubella - imm 10/15/20  varicella- imm 10/15/20  Parvavirus -unknown   Toxo - Neg 10/15/20  Adv otc ca++  blood type - A+  [dd]CF,SMA,Fragile X- [dd] Counsyl drawn 10/31/15-NEGATIVE  [dd]Natera-274-declines   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  EPDS=0  Preferred Lab - N/A  [dd]Prog until 12 wks -stop 11/16/20  [xxx]h/o GDM, on Metformin in 1st trimester - she will stop now  [dd]BV on GET 10/02/20, tx after 12 weeks        Vital Signs   Weight: 165 lb. (75 kg.)  Height:          60.75 in. (154.31 cm.)  Body Mass Index: 31.55  BSA:  1.74    LMP: 08/24/2020      Blood Pressure #1: 110 / 70 mm Hg (right arm)  Vitals taken by:  Roney Marion [place].....................December 11, 2020 1:57 PM      Urinalysis DipstickProtein: negative  Glucose: normal  Leukocytes: negative  Nitrite: negative    Venipuncture   Venipuncture Completed  No Translator Needed  Roney Marion [place].....................December 11, 2020  1:57 PM      Family Medical History    -- Negative history of Colon Cancer   -- Negative history of Breast Cancer    -- Negative history of Other Cancer   -- Negative history of Diabetes   -- Positive history of Hyperlipidemia   -- Negative history of Hypertension   -- Positive history of CAD   -- Negative history of Stroke         Hip Replacement? No  Knee Replacement? No  Pacemaker? No  Heart Murmur? No  Antibiotic prior to dental work? No      Trauma/Violence Hx   Pt feels safe. She has never been abused nor forced to have sex.          Best Working [facility]: 05/31/2021      Urinalysis (dipstick)   Leukocyte esterase: negative  Nitrite:  negative  Protein: negative  Glucose: normal        Orders:  Added new Service order of Patient Encounter ([id number]) - Signed  Added new Service order of Venipuncture ([id number]) - Signed  Added new Test order of AFP (only) (MSAFP) - Signed  Added new Service order of OB Visit (CPT-00010) - Signed  Added new Service order of OB Visit (CPT-00010) - Signed      Education             ]Dr. Dorna Bloom provided supervision for this service and is in agreement with the assessment and plan as stated above.     signed Ferd Hibbs, NP    signed Richarda Osmond. Dorna Bloom MD       Created By Freddrick March Kingston on 12/11/2020 at 01:56 PM    Electronically Signed By Ferd Hibbs NP on 12/11/2020 at 02:35 PM

## 2020-12-11 NOTE — Discharge Summary (Signed)
DISCHARGE SUMMARY  Rpt # [id number]               [facility]                              [facility]                                  [address]                                  [place], [place]  [address]                                    [phone]                                  Patient:            Paige [name], STILLS                  DOB:                1980/11/28  Pt. Location:       M.MAT6           MR#:                [id number]    Admission Date:     06/07/16 Acct #: [id number]  Attending M.D.:     Sherald Barge MD               Report Status:      Signed     ________________________________________________________________________________                                      DISCHARGE SUMMARY           DATE OF ADMISSION:  06/07/2016  DATE OF DISCHARGE:  06/10/2016     DIAGNOSIS:  Thirty-nine plus weeks gestational age with diet-controlled   gestational diabetes.      PROCEDURE:  She had Cervidil placed on September 23rd.  She had a spontaneous   vaginal delivery on September 24 at 6:49 p.m.  Baby is a viable female, birth   weight 6 pounds 12 ounces with Apgars 9 at 1 minute, 9 at 5 minutes.  Baby   had a circumcision on postpartum day 1 without any problems.     HOSPITAL COURSE:  Patient was admitted to the hospital for induction of labor   with Cervidil, followed by Pitocin.  She had an epidural for pain management.    She had a spontaneous vaginal delivery at 6:49 p.m. on September 24.  Baby is   a viable female, birth weight 6 pounds 12 ounces with Apgar 9 at 1 minute, 9 at   5 minutes.  Baby had a circumcision on postpartum day 2 without any problems.    At time of discharge, she is breast feeding.  Her breasts are soft,   nontender.  Signs and symptoms of breast infection reviewed with her.  She   was advised to continue take her  prenatal vitamins and drink lots of fluids.    Her abdomen is soft, nontender.  Fundus firm and nontender.  Lochia within   normal.  Extremity no edema.   Back, no CVA tenderness.  She was discharged   home with no medications.  She was to continue to take her prenatal vitamins   and drink lots of fluids and eat well.  She can take over-the-counter pain   medication for pain control.  Otherwise, she will make appointment in the   office in 6 weeks or earlier.        ________________________________________  Dictated By:  Sherald Barge, MD     D: 06/11/2016 10:21  T: 06/11/2016 19:03  Voice#/Text#: [id number]  JSW/pi        THIS DOCUMENT IS A DRAFT AND WAS REPRODUCED FROM ITS ELECTRONIC FORM FOR  INFORMATIONAL PURPOSES ONLY.  THIS DRAFT SHOULD NOT, UNDER ANY CIRCUMSTANCES,  BE RELIED ON AS A COMPLETE AND FINAL REPRODUCTION OF THE LEGAL MEDICAL RECORD                 UNLESS AUTHENTICATED BY THE RESPONSIBLE AUTHOR.                                        ELECTRONICALLY SIGNED                                       Sherald Barge, MD                                                 06/12/16    1428

## 2020-12-11 NOTE — Op Note (Signed)
OPERATIVE REPORT  Rpt # [phone]               [facility]                              [facility]                                  [address]                                  [place], [place]  [address]                                    [phone]                                  Patient:            Paige [name], REIGER                  DOB:                August 23, 1981  Pt. Location:       M.MAT6           MR #:               [id number]    Admission Date:     06/07/16 Acct #: [id number]  Attending M.D.:     Sherald Barge MD               Report Status:      Signed     ________________________________________________________________________________                                      OPERATIVE REPORT        SURGEON:  Sherald Barge, MD     DATE OF OPERATION:  06/08/2016     DATE OF DELIVERY:  September 24th at 6:49 p.m.     PREOPERATIVE DIAGNOSIS(ES):  39 weeks 4 days gestational age with diet-  controlled gestational diabetes.       POSTOPERATIVE DIAGNOSIS(ES):  39 weeks 4 days gestational age with diet-  controlled gestational diabetes.       OPERATION:  She had Cervidil placed on September 23rd at 6 p.m. and she had   Pitocin started on September 24 and a spontaneous vaginal delivery on   September 24 at 6:49 p.m. with baby in the LOA position, vertex.  No   amnioinfusion, no IUPC, no scalp clip, no episiotomy.  There is a first-  degree perineal laceration with 1 single stitch of 4-0 Vicryl.  Placenta   expelled spontaneously.  Rectal exam was intact.  Baby is a viable female,   birth weight 6 pounds 12 ounces with Apgars 9 at one minute, 9 at five   minutes.  Amniotic fluid was clear.  Sponge count and instrument count were   correct and there were no complications.     HOSPITAL COURSE:  Patient was admitted to the hospital with favorable cervix   at 39+ weeks'  gestational age with diet-controlled gestational diabetes.  She   had Cervidil placed September 23 at 6 p.m. and Cervidil was  removed after 12   hours and she was then given Pitocin on September 24.  She progressed to   complete at 5:50 after epidural management for pain.  She began to push at   6:47 p.m.  Had a spontaneous vaginal delivery at 6:49 p.m.  Once the vertex   was out, baby was in LOA position, the oropharynx was well suctioned followed   by the body of newborn.  Baby was placed on Mom's abdomen immediately   __________  skin.  Cord was clamped after milking the cord toward the baby 15   times and the cord was clamped 1 minute after delivery.  Cord blood was   obtained.  Placenta expelled spontaneously.  Exam of perineum showed a first-  degree perineal laceration, repaired with 4-0 Vicryl __________ .  The   placenta was expelled at 6:51 p.m.  The first-degree perineal laceration was   repaired with 4-0 Vicryl without any problems.  After that, rectal exam was      done and showed no injury to rectal mucosa or rectal sphincter.  Patient was   then placed on the delivery bed in the supine position without any problems.          ________________________________________     Rpt # [id number]  [facility]  [facility]  OPERATIVE REPORT                       Patient:  Paige [name], Paige [name]                  MR #:     [id number]    ________________________________________________________________________________     Dictated By:  Sherald Barge, MD     D: 06/11/2016 10:19  T: 06/11/2016 19:17  Voice#/Text#: [id number]  JSW/pi        THIS DOCUMENT IS A DRAFT AND WAS REPRODUCED FROM ITS ELECTRONIC FORM FOR  INFORMATIONAL PURPOSES ONLY.  THIS DRAFT SHOULD NOT, UNDER ANY CIRCUMSTANCES,  BE RELIED ON AS A COMPLETE AND FINAL REPRODUCTION OF THE LEGAL MEDICAL RECORD                 UNLESS AUTHENTICATED BY THE RESPONSIBLE AUTHOR.                                        ELECTRONICALLY SIGNED                                       Sherald Barge, MD                                                 06/12/16    1428

## 2020-12-12 NOTE — Progress Notes (Signed)
[facility]   [address]   [place], [place] [address]  Office: [phone] Fax: [phone]          Name   Paige [name]             Patient #   [id number]    Visit Date Weeks Gest Wt BP UPro UGlu UNit HA N/V Edema VagBld VagD/C   06/05/2016 39 2/7 161 116 / 76 trace neg neg No No 0 no no   05/29/2016 38 2/7 160 114 / 70 trace neg neg No No 0 no no   05/22/2016 37 2/7 160 100 / 60 neg neg neg No No 0 no no   05/14/2016 36 1/7 160 100 / 60 neg neg neg No No 0 no no   05/08/2016 35 2/7 160 106 / 64 trace neg neg No No 0 no no   04/23/2016 33 1/7 161 118 / 68 neg neg neg No No 0 no no   04/09/2016 31 1/7 162 110 / 54 trace neg neg No No 0 no no   03/26/2016 29 1/7 161 110 / 60 neg neg neg No No 0 no no   03/11/2016 27 0/7 163 110 / 60 neg neg neg No No 0 no no   02/20/2016 24 1/7 160 110 / 60 neg neg neg No No 0 no no   01/23/2016 20 1/7 153 100 / 60 neg neg neg No No 0 no no   12/26/2015 16 1/7 150 110 / 60 neg neg neg + No 0 no no   11/28/2015 12 1/7 144 110 / 60 neg neg neg + No 0 no no   10/31/2015 8 1/7 144 116 / 58 NEGATIVE NEGATIVE NEGATIVE + nausea 0 no no                                                                                         Visit Date Weeks  Gest Fund  Ht FHR FetAct LbrSxs Position CxDil CxEff CxSta Vit? SmokHx RTC Res Prec   06/05/2016 39 2/7 39 146 yes no vertex 1+ 50% -3 Y n/a 1 wk     05/29/2016 38 2/7 38 147 yes no  0 0% high Y n/a 1 wk     05/22/2016 37 2/7 37 146 yes no  0 0% high Y n/a 1 wk     05/14/2016 36 1/7 36 146 yes no  0 0% high Y n/a 1 wk     05/08/2016 35 2/7 35 146 yes no  0 0% high Y n/a 1 wk     04/23/2016 33 1/7 33 146 yes no     Y n/a 2 wk     04/09/2016 31 1/7 31 146 yes no  0 0% high Y n/a 2 wk     03/26/2016 29 1/7 29 156 yes no     Y n/a 2 wk     03/11/2016 27 0/7 27 156 yes no     Y n/a 2 wk     02/20/2016 24 1/7 24 146 yes no     Y n/a  3 wk     01/23/2016 20 1/7 20 146 yes no     Y n/a 4 wk     12/26/2015 16 1/7  156 N/A no     Y n/a 4 wk     11/28/2015 12  1/7  + N/A no     Y n/a 4 wk     10/31/2015 8 1/7   N/A no  0 0% high Y n/a 4 wk                                                                                                               Name   Paige [name]             Patient #   [id number]  Hospital # [id number]     Visit Date [name] Comments   06/05/2016 39 2/7 Pt wants to discuss flu vaccine.- flu vaccine today  NST/BPP on monday was wnl    discussed IOL on sat @ 5pm - she had IOL before  Risks of Induction of labor    1. hyperstimulation    2. the need of using Terbutaline to decrease the frequency of uterine contraction and the side effects of Terbutaline such as increaseing maternal heart rate    3. fetal deceleration and the need fo emergency cesarean section and the need of future cesarean section if she has one.    4. ruptured uterus    These risks are very small but it is not zero.     05/29/2016 38 2/7 reviewed [facility], s/s of labor and when and where she should report when she is in lobor, ruptured bag of water, vaginla bleeding, decrese fetal movement, extremities swelling , headahe, vision chage or severe right upper abodomenal pain.     she was advised to call office # and push 0 if we are not in office and ususlly I will call them back within 5-10' and called again if they did not get a return call from me. she should proceed to L/D immediately if she did not here from me after 2nd call.       05/22/2016 37 2/7 reviewed [facility], s/s of labor and when and where she should report when she is in lobor, ruptured bag of water, vaginla bleeding, decrese fetal movement, extremities swelling , headahe, vision chage or severe right upper abodomenal pain.     she was advised to call office # and push 0 if we are not in office and ususlly I will call them back within 5-10' and called again if they did not get a return call from me. she should proceed to L/D immediately if she did not here from me after 2nd call.    discussed diet and adv no cutting down to  the amt of food and caloeries   05/14/2016 36 1/7 reviewed [facility], s/s of labor and when and where she should report when she is in lobor, ruptured bag of water, vaginla bleeding, decrese fetal movement, extremities  swelling , headahe, vision chage or severe right upper abodomenal pain. CRICO consent received and quequed to chart and adv pt to bring to hospital the original copy.     05/08/2016 35 2/7 GBBS done, reviewed [facility], s/s of labor and when and where she should report when she is in labor    [facility] cosent/[facility] blood transfusion consent gave pt to review and bring back to office after signed        [xxx] GS 05/12/16 @ 8am [address]  NST for GDM (diet control) 04/16/16 @ 10am           04/23/2016 33 1/7 has NST for GDM  next [place]  [xxx] 05/12/16 @ 8am [address]   04/09/2016 31 1/7 [xxx]GS @ 28 wks-04/09/16 @ 1pm 50 [address] (changed by pt)  NST for GDM (diet control) 04/16/16 @ 10am         03/26/2016 29 1/7 dx: GDM  xxx]GS @ 28 wks-04/09/16 @ 1pm [address] (changed by pt)       03/11/2016 27 0/7 1 hr GLT/CBC/RPR done today, s/s PET/PTL/[facility] reviewed    [facility] GTT handout given      [xxx]GS @ 28-32 weeks-04/16/16 @ 3:30pm 50 [address]e'    her CTS back and she  had this with her last pregnancy and she used otc device and this time is not working and will refer to PT  her back is better with mat belt   02/20/2016 24 1/7 glt in 3 weks  discussed mat belt while woking   01/23/2016 20 1/7 reviewed 18wks FS & genetic screen result. adv PNC & sleep on her side and discussed diet, exercise, activity, sex and finding a pediatrician.      [dd]MSAFP only=12/26/2015 0.54 MOM    [xxx] 18 wks FS @ 01/09/16 [facility]-incomplete views of spine- f/u to complete survey-01/23/16 @ 4:15pm [address]   [xxx]GS @ 28-32 weeks-04/16/16 @ 3:30pm [address]   12/26/2015 16 1/7 [xxx]MSAFP only @ 16 weeks   [xxx] 18 wks FS @ 01/09/16 at 10:45 am [facility]   11/28/2015 12 1/7    10/31/2015 8 1/7                                  [id number]    [name]    Impression Plan          Copyright 2005, [employer]. All rights reserved.

## 2020-12-12 NOTE — Teleconsult (Signed)
Phone Note -       Initial call taken by: Malachy Chamber MD,  December 26, 2015 7:49 AM  Initial Details of Call:  Tinnie Gens  need GS @ 28-32 weeks      Follow-up #1  Details: requested at [facility]  Action: Phone call completed  By: Wendall Papa RN ~ December 26, 2015 3:28 PM

## 2020-12-12 NOTE — Teleconsult (Signed)
Internal Correspondence   Initial call taken by: Malachy Chamber MD,  March 12, 2016 12:25 PM  Type: Patient records  Summary of Call: LM to call me on my cell re: the need of 3HGLT      Follow-up #1  Details: Dr. Dorna Bloom spoke with the patient and she will go on 03/14/16 to [facility] @ 8am for 3 hour glt  Action Taken: Completed  By: Amil Amen ~ March 12, 2016 1:24 PM        Orders:  Added new Test order of GDMS3- GDMS 3 Hrs (GDMS3) - Signed

## 2020-12-12 NOTE — Progress Notes (Signed)
[facility]achy [facility]   [address]   [place], [place] [address]  Office: [phone] Fax: [phone]              Prenatal Record  PATIENT'S RH: POSITIVE  Physician:  Ellan Lambert MD [name]:  06/10/2016 Date printed: 06/07/2016 (39.57 wks )   [id number]                                                 ********Issues/Risks with this Pregnancy for Labor and Delivery********    Dr. Ernest [name] in [place]     Demographics  Name    Paige [name] DOB    04/18/1981 Age    40 Years Old Race    White Status    Married   Address  [address]  [phone] Other Phone     [place]  [place]    [address] SS#  [id number]   Occupation    outside work Work [employer] Patient #    [id number]     Insurance    [name] Policy #    [id number] Guarantor    Paige [name]   FOB    Paige [name] Involved   Age Race      Occupation  Chef   FOB Phone [phone]    Support Person     Home Phone Relationship   Infant's Physician    Dr. Abe People [place] [facility]  [facility] Feeding plans           Previous Pregnancies G [name] P 1 Term 1 Preterm  LC 1    Ab 1 SAB 1 EAB  Ectopic  Mult Preg    No Date Wks  Gest Delivery  Type Hrs  Labor Anes Location Infant Comments          Sex Weight    1 2013  SAB                2 08/29/2013 40 1/2  NSVD         [place] M 7-1 IUI, IOL for AFP  she had prog supp  Name   Paige [name]             Patient #   [id number]    Physical Exam  Gen appearance: Gen:NAD ,   Skin: no rash,   Eyes: PERLA,EOMI, no lid lag/retraction/corneal injection   Neck: no LNP   Thyroid:Thyroid normal , no nodules, No LNP, no bruit   Chest:  Symmetrical,    CV: S1 S2 normal, RRR. No M/g   Resp:CTA B/L   GI/Abd: BS+, No organomegaly   Foot: DP+,No LE edema    MSK: No joint enlargement  Neuro: AAOx3, No tremors   Psych: mood stable        Affect/Mood:    Head:  Ears:TM's intact and clear with normal canals with grossly normal hearing.    Eyes:TM's intact and clear with normal canals with grossly normal hearing.    Nose: L turbinate boggy/ erythematou, tenderness ethmoid sinus   Oral: no deformity or lesions with good dentition.    Skin:    Neck: supple, no adenopathy, no masses, thyroid normal size, no thyroid tenderness or nodules,.    Heart:RRR, no murmurs, no gallops.    Lungs: No respiratory distress, no accessory muscle use, clear to auscultation.    Breasts:   no asymmetry, no skin changes, no nipple discharge,no masses,no tenderness.    Abdomen: nondistended, nontender, no guarding, normal BS, no hepatosplenomegaly, no hernias, no CVA tenderness bilaterally.    Extremities:   Back:   Neuro:      Vulva: Normal appearance, normal hair distribution, no lesions or masses.    Vagina: Normal, rugated, physiologic discharge, no lesions, no masses, adequate pelvic support.    Cervix: Normal, no motion tenderness, no lesions.    Adnexa: Normal, no masses, mobile, nontender.    Rectal: EXTERNAL HEMORRHOIDS.    Uterus:smooth, mobile, non-tender, firm, adequate support, no prolapse, 6-8 WKS SIZE.    Urethra: Normal, no masses, non-tender, no discharge.         Additional Information      EDC / Dating Information  Current best EDC: 06/10/2016   Cycle Interval: 30 days    LMP: 09/04/2015 =EDC 06/10/2016      Reliability: Yes    Pill period? no   Preg Test Date      Initial Exam 10/31/2015 =EDC    Date Steth Tones   =EDC     Date Quickening   =EDC     U/S     Date GA =EDC   10/22/2015  06/13/2016                    Allergies  * NO KNOWN DRUG  ALLERGIES (Mild)     Paige [name]    [id number]  Medications  TRIAMCINOLONE ACETONIDE 0.1 % CREA (TRIAMCINOLONE  ACETONIDE) apply two times daily as needed to  rash - apply sparingly  LORATADINE 10 MG TABS (LORATADINE) take one by mouth daily as needed for allergies  METFORMIN HCL 500 MG TABS (METFORMIN HCL) take one by mouth twice daily for PCO  FISH OIL 1000 MG CAPS (OMEGA-[name] FATTY ACIDS) 1 by mouth once daily  CVS PRENATAL MULTI+DHA CAPS (PRENATAL MV-MIN-FE FUM-FA-DHA CAPS)   CALCIUM CARBONATE-VITAMIN [name] 600-400 MG-UNIT TABS (CALCIUM CARBONATE-VITAMIN [name]) Take one tablet by mouth daily  ONETOUCH VERIO STRP (GLUCOSE BLOOD) check four times a day  ONETOUCH DELICA LANCETS 33G MISC (LANCETS) check four times a day  ONETOUCH VERIO STRP (GLUCOSE BLOOD) check four times a  day                                                        ********Risks with this Pregnancy for Labor and Delivery********    Dr. Ernest [name] in [place]     Current Problems Risk Factors   [redacted] WEEKS GESTATION OF PREGNANCY (ICD-V28.9) (ICD10-Z3A.39)  CERVICAL LYMPHADENOPATHY (ICD-785.6) (ICD10-R59.0)  HYPERLIPIDEMIA (ICD-272.4) (ICD10-E78.5)  PREVENTIVE HEALTH CARE (ICD-V70.0) (ICD10-Z00.00)  ECZEMA (ICD-692.9) (ICD10-L30.9)  ROUTINE GYNECOLOGICAL EXAMINATION (ICD-V72.31) (ICD10-Z01.419)      POLYCYSTIC OVARIAN SYNDROME; 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)  SUPERVISION OF OTHER HIGH RISK PREGNANCIES, THIRD TRIMESTER (ICD-V23.89) (ICD10-O09.893)      GESTATIONAL DIABETES (ICD-648.80) (ICD10-O24.419)    [name],P1,08/29/13 NVSD Boy) [dd]Flu 06/05/2016  [dd] EDC 06/10/16 by LMP(boy)DM-diet control [dd]Tdap gvn07/12/17  [dd] AMA/ERA-NIPT-Neg/ BOY  [dd]MSAFP only=12/26/2015 0.54 MOM  [dd]18 wks  complete survey-01/23/16 [address]-COMPLETE NL FS   [dd] 7/26/17GS-[name]-13  [dd] GS 05/12/16 @ [address]- VTX, 2,573g, 5-11, 31%  HGBEG- Neg  HSV 1&2- neg  Hep panels- Neg  Rubella - Imm  varicella- Imm  Parvavirus -unknown   Toxo - Neg  Adv otc ca++  blood type - A+  CF,SMA,Fragile X- [dd] Counsyl drawn 10/31/15-NEGATIVE  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  Ernest [name]  Paige [name]  Preferred Lab - N/A  [dd] Prog until 12 wks [name]/15/17  [dd] Metformin until [name]/29/17  Paige=0 03/11/16  PT for bilat carpal tunnel 03/19/16 @ [name]:30pm ([address])  [dd]GDM - [dd] endocrin appt -03/24/16 @ 2:30pm Paige [name]          [dd] nutritionist appt  [dd] diabetic nurse appointment -          [dd] MFM-  04/09/16 @2pm  [address]          [dd] A1C-wnl  [dd]05/22/2016 breast feeding reviewed and information             given.consent signed. she has no questions and she plans to          breast feed after giving birth.  05/14/16 CRICO         Maternal age = 40 Years Old  Prior Operation(s): 2013 [name]&C ; she has an arcuate uterus and hystersocpy  Hx of Varicella: Hx of Disease: Immune  Hx of STD's  Smoking: never smoked  Hx Cystic fibrosis: Negative Carrier Status         ADREONNA YONTZ    [id number] Laboratory  Test Date Result   Hemoglobin 03/11/2016 12.4   Hematocrit 03/11/2016 36.5   Blood Type  Rh Antibody Scr  Rh Type 10/31/2015     A POSITIVE       TSH  TSH w/ Reflex   06/20/2015   1.47   Rubella   10/31/2015   POSITIVE     Serology (RPR) 03/11/2016 NON-REACTIVE   Platelets 03/11/2016 199 K/UL   Chlamydia Culture  Chlamydia Ab         GC Culture  GC Ab   10/31/2015   NEGATIVE (RNA-PROBE)   Urine Culture 10/31/2015 <10,000 col/ml SKIN FLORA ISOLATED   PAP 10/31/2015 SOUT   HIV     HPV 11/01/2014 Negative   MSAFP     SEQUENTIAL#1     SEQUENTIAL#2     MSAFP MOM  GTT 1H 50G 03/11/2016 169   GTT FBS 03/14/2016 74    GTT  1 hr 03/14/2016 170     GTT 2 hr 03/14/2016 166     GTT [name] hr 03/14/2016 145     Test Date Result   Dating U/S 10/22/2015 8:00am @ [facility]   Survey U/S     Toxoplasmosis IGG        Toxoplasmosis IGM 10/31/2015        10/31/2015   NEGATIVE         NEGATIVE   Alfonzo Feller 11/28/2015 Absent   Amnio     PPD     RhoGam     KB (Fetaldex)     Group B Strep cult   05/08/2016   NO BETA STREP ISOLATED   Downs Synd Odds     Trisomy 18 Odds     Hep B 10/31/2015 Non-reactive   Hep C 10/31/2015 Non-reactive    Varicella    Parvovirus IGM  Parvovirus IGG   10/31/2015         POSITIVE         Cystic Fibrosis     Last TDAP:   done07/08/2016    Last FLU : Flu IM09/21/2017      [name]  07-Jul-1981                                                   ********Issues/Risks with this Pregnancy for Labor and Delivery********    Dr. Ernest [name] in [place]

## 2020-12-12 NOTE — Progress Notes (Signed)
[facility]   [address]   [place], [place] [address]  Office: [phone] Fax: [phone]        Prenatal Record  PATIENT'S RH: POSITIVE  Physician:  Ellan Lambert MD [facility]:  06/10/2016 Date printed: 04/26/2016 (33.57 wks )   [id number]                                                 ********Issues/Risks with this Pregnancy for Labor and Delivery********    Dr. Ernest Haber in [place]     Demographics  Name    Paige [name] DOB    Feb 28, 1981 Age    40 Years Old Race    White Status    Married   Address  [address] Phone  [phone] Other Phone     [place]  [place]    [address] SS#  [id number]   Occupation    outside work Work Phone  [phone] Hospital Patient #    [id number]     Insurance    [place] [employer] [facility] Policy #    [id number] Guarantor    Wendall Mola D   FOB    Jaquay Morneault Involved   Age Race      Occupation  Chef   FOB Phone [phone]    Support Person     Home Phone Relationship   Infant's Physician    Dr. Abe People [place] [facility]  [facility] Feeding plans           Previous Pregnancies G 3 P 1 Term 1 Preterm  LC 1    Ab 1 SAB 1 EAB  Ectopic  Mult Preg    No Date Wks  Gest Delivery  Type Hrs  Labor Anes Location Infant Comments          Sex Weight    1 2013  SAB                2 08/29/2013 40 1/2  NSVD         [place] M 7-1 IUI, IOL for AFP  she had prog supp  Name   Paige [name]             Patient #   [id number]    Physical Exam  Gen appearance: Alert, no acute distress, well hydrated, well developed, well nourished, appropriate dress,non-ill appearing.   Affect/Mood:    Head:  Ears:TM's intact and clear with  normal canals with grossly normal hearing.    Eyes:TM's intact and clear with normal canals with grossly normal hearing.    Nose: L turbinate boggy/ erythematou, tenderness ethmoid sinus   Oral: no deformity or lesions with good dentition.    Skin:    Neck: supple, no adenopathy, no masses, thyroid normal size, no thyroid tenderness or nodules,.    Heart:RRR, no murmurs, no gallops.    Lungs: No respiratory distress, no accessory muscle use, clear to auscultation.    Breasts:   no asymmetry, no skin changes, no nipple discharge,no masses,no tenderness.    Abdomen: nondistended, nontender, no guarding, normal BS, no hepatosplenomegaly, no hernias, no CVA tenderness bilaterally.    Extremities:   Back:   Neuro:      Vulva: Normal appearance, normal hair distribution, no lesions or masses.    Vagina: Normal, rugated, physiologic discharge, no lesions, no masses, adequate pelvic support.    Cervix: Normal, no motion tenderness, no lesions.    Adnexa: Normal, no masses, mobile, nontender.    Rectal: EXTERNAL HEMORRHOIDS.    Uterus:smooth, mobile, non-tender, firm, adequate support, no prolapse, 6-8 WKS SIZE.    Urethra: Normal, no masses, non-tender, no discharge.         Additional Information      EDC / Dating Information  Current best EDC: 06/10/2016   Cycle Interval: 30 days    LMP: 09/04/2015 =EDC 06/10/2016      Reliability: Yes    Pill period? no   Preg Test Date      Initial Exam 10/31/2015 =EDC    Date Steth Tones   =EDC     Date Quickening   =EDC     U/S     Date GA =EDC   10/22/2015  06/13/2016                    Allergies  * NO KNOWN DRUG  ALLERGIES (Mild)     Paige [name]    [id number]  Medications  TRIAMCINOLONE ACETONIDE 0.1 % CREA (TRIAMCINOLONE ACETONIDE) apply two times daily as needed to  rash - apply sparingly  LORATADINE 10 MG TABS (LORATADINE) take one by mouth daily as needed for allergies  METFORMIN HCL 500 MG TABS (METFORMIN HCL) take one by mouth twice daily for PCO  FISH OIL 1000 MG CAPS  (OMEGA-3 FATTY ACIDS) 1 by mouth once daily  [employer]S PRENATAL MULTI+DHA CAPS (PRENATAL MV-MIN-FE FUM-FA-DHA CAPS)   CALCIUM CARBONATE-VITAMIN D 600-400 MG-UNIT TABS (CALCIUM CARBONATE-VITAMIN D) Take one tablet by mouth daily  ONETOUCH VERIO STRP (GLUCOSE BLOOD) check four times a day  ONETOUCH DELICA LANCETS 33G MISC (LANCETS) check four times a day  ONETOUCH VERIO STRP (GLUCOSE BLOOD) check four times a day                                                        ********Risks with this Pregnancy for Labor and Delivery********    Dr. 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)  SUPERVISION OF OTHER HIGH RISK PREGNANCIES, THIRD TRIMESTER (ICD-V23.89) (ICD10-O09.893)      GESTATIONAL DIABETES (ICD-648.80) (ICD10-O24.419)    Paige [name],Paige [name]  [dd] EDC 06/10/16 by LMP(boy)   DM                [dd]Tdap gvn 03/26/16  [dd] AMA/ERA-NIPT-Neg/ BOY  [dd]MSAFP only=12/26/2015 0.54 MOM  [dd]18 wks FS @ 01/09/16 [facility]-incomplete views of spine- f/u           to complete survey-01/23/16 [address]-COMPLETE NL FS   [dd] 7/26/17GS-3-13  [xxx] 05/12/16 @ 8am [address]  HGBEG- Neg  HSV 1&2- neg  Hep panels- Neg  Rubella - Imm  varicella- Imm  Parvavirus -unknown   Toxo - Neg  Adv otc ca++  blood type - A+  CF,SMA,Fragile X- [dd] Counsyl drawn 10/31/15-NEGATIVE  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 Ernest Haber  EPDS 3  Preferred Lab - N/A  [dd] Prog until 12 wks 11/28/15  [dd] Metformin until 12/12/15  EPDS=0 03/11/16  PT for bilat carpal tunnel 03/19/16 @ 3:30pm ([address])  [dd]GDM - [dd] endocrin appt -03/24/16 @  2:30pm [facility]          [dd] nutritionist appt  [dd] diabetic nurse appointment -          [dd] MFM-  04/09/16 @2pm  [address]  [dd] A1C-wnl  NST for GDM (diet control) 04/16/16 @ 10am       Maternal age = 40 Years Old  Prior Operation(s): 2013 D&C ; she has an arcuate uterus and hystersocpy  Hx of Varicella: Hx of Disease: Immune  Hx of STD's  Smoking: never smoked  Hx Cystic fibrosis: Negative Carrier Status         Paige [name]    Paige [name] Laboratory  Test Date Result   Hemoglobin 03/11/2016 12.4   Hematocrit 03/11/2016 36.5   Blood Type  Rh Antibody Scr  Rh Type 10/31/2015     A POSITIVE       TSH  TSH w/ Reflex   06/20/2015   1.47   Rubella   10/31/2015   POSITIVE     Serology (RPR) 03/11/2016 NON-REACTIVE   Platelets 03/11/2016 199 K/UL   Chlamydia Culture  Chlamydia Ab         GC Culture  GC Ab   10/31/2015   NEGATIVE (RNA-PROBE)   Urine Culture 10/31/2015 <10,000 col/ml SKIN FLORA ISOLATED   PAP 10/31/2015 SOUT   HIV     HPV 11/01/2014 Negative   MSAFP     SEQUENTIAL#1     SEQUENTIAL#2     MSAFP MOM     GTT 1H 50G 03/11/2016 169   GTT FBS 03/14/2016 74    GTT  1 hr 03/14/2016 170     GTT 2 hr 03/14/2016 166     GTT 3 hr 03/14/2016 145     Test Date Result   Dating U/S 10/22/2015 8:00am @ [facility]   Survey U/S     Toxoplasmosis IGG        Toxoplasmosis IGM 10/31/2015        10/31/2015  NEGATIVE         NEGATIVE   Paige [name] 11/28/2015 Absent   Amnio     PPD     RhoGam     KB (Fetaldex)     Group B Strep cult         Downs Synd Odds     Trisomy 18 Odds     Hep B 10/31/2015 Non-reactive   Hep C 10/31/2015 Non-reactive   Varicella    Parvovirus IGM  Parvovirus IGG   10/31/2015         POSITIVE         Cystic Fibrosis     Last TDAP:   done07/08/2016    Last FLU : Flu IM10/02/2015      [name]  09-30-1980                                                   ********Issues/Risks with this Pregnancy for Labor and Delivery********    Dr. Ernest Haber in [place]

## 2020-12-12 NOTE — Progress Notes (Signed)
[facility]   [address]   [place], [place] [address]  Office: [phone] Fax: [phone]    Name   Paige [name]             Patient #   [id number]    Visit Date Weeks Gest Wt BP UPro UGlu UNit HA N/V Edema VagBld VagD/C   04/23/2016 33 1/7 161 118 / 68 neg neg neg No No 0 no no   04/09/2016 31 1/7 162 110 / 54 trace neg neg No No 0 no no   03/26/2016 29 1/7 161 110 / 60 neg neg neg No No 0 no no   03/11/2016 27 0/7 163 110 / 60 neg neg neg No No 0 no no   02/20/2016 24 1/7 160 110 / 60 neg neg neg No No 0 no no   01/23/2016 20 1/7 153 100 / 60 neg neg neg No No 0 no no   12/26/2015 16 1/7 150 110 / 60 neg neg neg + No 0 no no   11/28/2015 12 1/7 144 110 / 60 neg neg neg + No 0 no no   10/31/2015 8 1/7 144 116 / 58 NEGATIVE NEGATIVE NEGATIVE + nausea 0 no no                                                                                                                                                               Visit Date Weeks  Gest Fund  Ht FHR FetAct LbrSxs Position CxDil CxEff CxSta Vit? SmokHx RTC Res Prec   04/23/2016 33 1/7 33 146 yes no     Y n/a 2 wk     04/09/2016 31 1/7 31 146 yes no  0 0% high Y n/a 2 wk     03/26/2016 29 1/7 29 156 yes no     Y n/a 2 wk     03/11/2016 27 0/7 27 156 yes no     Y n/a 2 wk     02/20/2016 24 1/7 24 146 yes no     Y n/a 3 wk     01/23/2016 20 1/7 20 146 yes no     Y n/a 4 wk     12/26/2015 16 1/7  156 N/A no     Y n/a 4 wk     11/28/2015 12 1/7  + N/A no     Y n/a 4 wk     10/31/2015 8 1/7   N/A no  0 0% high Y n/a 4 wk  Name   Paige [name]             Patient #   [id number]  Hospital # [id number]     Visit Date [name] Comments   04/23/2016 33 1/7 has NST for GDM  next [place]  [xxx] 05/12/16 @ 8am 50 Rowe   04/09/2016 31 1/7 [xxx]GS @ 28 wks-04/09/16 @ 1pm 50 Rowe (changed  by pt)  NST for GDM (diet control) 04/16/16 @ 10am         03/26/2016 29 1/7 dx: GDM  xxx]GS @ 28 wks-04/09/16 @ 1pm 50 Rowe (changed by pt)       03/11/2016 27 0/7 1 hr GLT/CBC/RPR done today, s/s PET/PTL/FMC reviewed    [facility] GTT handout given      [xxx]GS @ 28-32 weeks-04/16/16 @ 3:30pm 50 Rowe'    her CTS back and she  had this with her last pregnancy and she used otc device and this time is not working and will refer to PT  her back is better with mat belt   02/20/2016 24 1/7 glt in 3 weks  discussed mat belt while woking   01/23/2016 20 1/7 reviewed 18wks FS & genetic screen result. adv PNC & sleep on her side and discussed diet, exercise, activity, sex and finding a pediatrician.      [dd]MSAFP only=12/26/2015 0.54 MOM    [xxx] 18 wks FS @ 01/09/16 [facility]-incomplete views of spine- f/u to complete survey-01/23/16 @ 4:15pm 50 Rowe   [xxx]GS @ 28-32 weeks-04/16/16 @ 3:30pm 50 Rowe   12/26/2015 16 1/7 [xxx]MSAFP only @ 16 weeks   [xxx] 18 wks FS @ 01/09/16 at 10:45 am [facility]   11/28/2015 12 1/7    10/31/2015 8 1/7                                                           [id number]    [name]    Impression Plan         Copyright 2005, [employer]. All rights reserved.
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
## 2020-12-16 LAB — HX MATERNAL SERUM AFP
HX MSAFP ABSOLUTE AFP: 52.98 ng/mL
HX MSAFP AGE AT TERM: 42.2
HX MSAFP EXPECTED AFP: <2.5
HX MSAFP GESTATIONAL AGE: 15.6 wk
HX MSAFP RELATIVE AFP: 1.8 {M.o.M}
HX MSAFP RESULT A: NEGATIVE
HX MSAFP WEIGHT: 165 [lb_av]

## 2020-12-18 ENCOUNTER — Encounter (INDEPENDENT_AMBULATORY_CARE_PROVIDER_SITE_OTHER): Admitting: Obstetrics & Gynecology

## 2020-12-19 ENCOUNTER — Encounter (INDEPENDENT_AMBULATORY_CARE_PROVIDER_SITE_OTHER): Admitting: Obstetrics & Gynecology

## 2020-12-19 ENCOUNTER — Encounter (INDEPENDENT_AMBULATORY_CARE_PROVIDER_SITE_OTHER)

## 2020-12-20 ENCOUNTER — Other Ambulatory Visit (HOSPITAL_BASED_OUTPATIENT_CLINIC_OR_DEPARTMENT_OTHER)

## 2020-12-23 ENCOUNTER — Encounter (INDEPENDENT_AMBULATORY_CARE_PROVIDER_SITE_OTHER): Admitting: Women's Health

## 2020-12-23 ENCOUNTER — Other Ambulatory Visit

## 2020-12-23 LAB — BI BILATERAL MAMMOGRAM SCREENING TOMOSYNTHESIS

## 2020-12-23 LAB — PAP SMEAR
HPV: NEGATIVE
Pap: NEGATIVE

## 2020-12-23 LAB — ABO/RH: ABO Grouping: A POS

## 2020-12-24 ENCOUNTER — Encounter (INDEPENDENT_AMBULATORY_CARE_PROVIDER_SITE_OTHER)

## 2020-12-24 ENCOUNTER — Encounter (INDEPENDENT_AMBULATORY_CARE_PROVIDER_SITE_OTHER): Admitting: Women's Health

## 2020-12-25 ENCOUNTER — Other Ambulatory Visit

## 2020-12-25 ENCOUNTER — Ambulatory Visit: Attending: Women's Health | Admitting: Women's Health

## 2020-12-25 ENCOUNTER — Encounter (INDEPENDENT_AMBULATORY_CARE_PROVIDER_SITE_OTHER): Admitting: Women's Health

## 2020-12-25 ENCOUNTER — Ambulatory Visit (INDEPENDENT_AMBULATORY_CARE_PROVIDER_SITE_OTHER): Admitting: Women's Health

## 2020-12-25 VITALS — BP 107/71 | Ht 61.0 in | Wt 165.0 lb

## 2020-12-25 DIAGNOSIS — Z3A17 17 weeks gestation of pregnancy: Secondary | ICD-10-CM

## 2020-12-25 LAB — GLUCOSE TOLERANCE PRENATAL 1 HOUR: Glucose, 1 hour: 161 mg/dL — ABNORMAL HIGH (ref <=140)

## 2020-12-25 LAB — POCT URINALYSIS DIPSTICK
Glucose, UA, POC: NEGATIVE
Leukocytes, UA, POC: NEGATIVE
Nitrite, UA, POC: NEGATIVE
Protein, UA, POC: NEGATIVE

## 2020-12-25 NOTE — Progress Notes (Addendum)
 Pt is here for an early glucola test  Pt is [redacted]w[redacted]d.      Paige [name] is a 40 y.o. [phone] at [redacted]w[redacted]d with a working estimated date of delivery of 05/31/2021, who presents for a routine prenatal visit. She denies vaginal bleeding, leakage of fluid, decreased fetal movements, or contractions.    Paige [name],P2, 2014 NVSD Boy, NSVD 2017 boy) DIAMNIOTIC MONOCHORIONIC TWINS  [dd]sono for dates 10/15/20 - twins, small perigest bld - adv pelvic  [dd]f/u sono - perigest blds persisi, adv pelvic rest  [dd]EDC = 05/31/21 by LMP           [dd] covid vax-boosted 06/2020       [dd]NIPT- Low risk, 2 girls   [dd] GC for AMA - telehealth 11/19/20   [dd] NT Scan-w/ MFM check [facility]- 11/21/20 @ 50 [address]-NL NT both Mono-Di twins, no SCH seen, start baby ASA, Q  [dd]MSAFP only @ 16 wks neg 1.8 MoM          [dd] Flu-vax 06/2013  [xxx] 18 wks FS - 4/22 [facility]   [xxx] fetal echo- TBD @ fetal survey -   [xxx] GS  in 3rd tri- Q4 wks  [dd]  [place] scheduled 4/7 @ 10am - [address]  every 2 weeks to check          [xxx] 24 wk-02/12/21 @ 10:45am @50  Rowe          [xxx] 28 wk-03/12/21 @ 1pm [address]          [xxx] 32 wk-04/09/21 @ 10:45am [address]          [xxx] 36 wk-05/06/21 @ 10:45am [address]   HGBEG- Neg                                  [xxx]Tdap  HSV 1&2- neg in past  Hep panels-neg 10/15/20   Rubella - imm 10/15/20  varicella- imm 10/15/20  Parvavirus -unknown   Toxo - Neg 10/15/20  Adv otc ca++  blood type - A+  [dd]CF,SMA,Fragile X- [dd] Counsyl drawn 10/31/15-NEGATIVE  [dd]Natera-274-declines   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  EPDS=0  Preferred Lab - N/A  [dd]Prog until 12 wks -stop 11/16/20  [dd]h/o GDM, on Metformin in 1st trimester - stopped 12/19/20  [dd]BV on GET 10/02/20, tx after 12 weeks  [Xxx] early glucola 4/12    Review of Systems   Constitutional: Negative.  Negative for chills, fever and malaise/fatigue.       Objective   Physical Exam  Weight: 74.8 kg  BP: 107/71    Prenatal Labs  Urine dip: neg    Assessment/Plan    [redacted] weeks gestation    Continue prenatal vitamin.  Pt is here for an early glucola test  Pt is [redacted]w[redacted]d.  Discussed sleeping on side  Venipuncture done today    Reviewed OB plan   12/20/20 [place] pending, she reports she did not need to meet with MFM at that appointment because she meet with them 11/21/20 and no further questions  Has [place] booked every 2 weeks.  Put in request for [place] report w/ Damali  rto in 2 weeks office visit with Dr. Dorna Bloom    Dr. Dorna Bloom provided supervision for this service and is in agreement with the assessment and plan as stated above.     signed Ferd Hibbs, NP    signed Victorino Dike  S. Dorna Bloom MD

## 2020-12-26 ENCOUNTER — Telehealth (INDEPENDENT_AMBULATORY_CARE_PROVIDER_SITE_OTHER): Admitting: Women's Health

## 2020-12-26 ENCOUNTER — Telehealth (HOSPITAL_BASED_OUTPATIENT_CLINIC_OR_DEPARTMENT_OTHER): Admitting: Family Medicine

## 2020-12-26 ENCOUNTER — Ambulatory Visit: Attending: Obstetrics & Gynecology

## 2020-12-26 ENCOUNTER — Encounter (INDEPENDENT_AMBULATORY_CARE_PROVIDER_SITE_OTHER): Admitting: Obstetrics & Gynecology

## 2020-12-26 ENCOUNTER — Encounter

## 2020-12-26 ENCOUNTER — Encounter (HOSPITAL_BASED_OUTPATIENT_CLINIC_OR_DEPARTMENT_OTHER): Admitting: Dermatology

## 2020-12-26 ENCOUNTER — Other Ambulatory Visit (INDEPENDENT_AMBULATORY_CARE_PROVIDER_SITE_OTHER): Admitting: Obstetrics & Gynecology

## 2020-12-26 DIAGNOSIS — R7309 Other abnormal glucose: Secondary | ICD-10-CM

## 2020-12-26 LAB — GTT, FASTING: Glucose, fasting: 74 mg/dL (ref 70–110)

## 2020-12-26 LAB — GTT 3 HOUR: Glucose, 3 hour: 122 mg/dL (ref 70–140)

## 2020-12-26 LAB — GTT 1 HOUR: Glucose, 1 hour: 138 mg/dL — ABNORMAL LOW (ref 180–450)

## 2020-12-26 LAB — GTT 2 HOUR: Glucose, 2 hour: 147 mg/dL — ABNORMAL LOW (ref 155–450)

## 2020-12-26 NOTE — Telephone Encounter (Signed)
 Annice Pih calling from Dr. Viviann Spare Cornbloof's office.  They are calling to refer patient over to Dr. Charm Barges.       Best call back number: [phone]

## 2020-12-26 NOTE — Telephone Encounter (Signed)
 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

## 2020-12-26 NOTE — Telephone Encounter (Signed)
 Spoke w/ pt  Reviewed 3 hour glucose test needed, she went this morning.  First 3 levels WNL  Will re-test at 24-28 weeks.   she verbalized understanding of the above, all questions answered.

## 2020-12-27 ENCOUNTER — Encounter (INDEPENDENT_AMBULATORY_CARE_PROVIDER_SITE_OTHER)

## 2020-12-28 ENCOUNTER — Other Ambulatory Visit

## 2020-12-31 ENCOUNTER — Other Ambulatory Visit

## 2021-01-01 ENCOUNTER — Encounter (HOSPITAL_BASED_OUTPATIENT_CLINIC_OR_DEPARTMENT_OTHER)

## 2021-01-02 ENCOUNTER — Ambulatory Visit: Attending: Obstetrics & Gynecology

## 2021-01-02 ENCOUNTER — Other Ambulatory Visit

## 2021-01-02 DIAGNOSIS — Z3490 Encounter for supervision of normal pregnancy, unspecified, unspecified trimester: Secondary | ICD-10-CM

## 2021-01-02 NOTE — Progress Notes (Signed)
The patient was seen in the [facility] for an ultrasound with Maternal Fetal Medicine. Please refer to Imaging Report for further details of today's visit.

## 2021-01-02 NOTE — Result Quicknote (Signed)
thanks

## 2021-01-03 ENCOUNTER — Other Ambulatory Visit

## 2021-01-05 ENCOUNTER — Other Ambulatory Visit

## 2021-01-05 ENCOUNTER — Encounter (INDEPENDENT_AMBULATORY_CARE_PROVIDER_SITE_OTHER)

## 2021-01-05 ENCOUNTER — Encounter (INDEPENDENT_AMBULATORY_CARE_PROVIDER_SITE_OTHER): Admitting: Obstetrics & Gynecology

## 2021-01-05 ENCOUNTER — Encounter (INDEPENDENT_AMBULATORY_CARE_PROVIDER_SITE_OTHER): Admitting: Family Medicine

## 2021-01-07 ENCOUNTER — Telehealth (HOSPITAL_BASED_OUTPATIENT_CLINIC_OR_DEPARTMENT_OTHER): Admitting: Family Medicine

## 2021-01-07 NOTE — Telephone Encounter (Signed)
Patient is calling in regard to cancel her consultation with doctor. Paige [name], patient stated that she canceled her appointment due to testing positive for Covid 19 on 04.23.2022. Patient is experience congestion and body aches; patient is pregnant with twin. Patient would like to know when she can reschedule her consultation.  Patient can be reached @ [phone]  Note from Oren Bracket on 04.25.2022 @ 11:05AM

## 2021-01-08 ENCOUNTER — Encounter (HOSPITAL_BASED_OUTPATIENT_CLINIC_OR_DEPARTMENT_OTHER): Admitting: Infectious Disease

## 2021-01-08 ENCOUNTER — Ambulatory Visit (INDEPENDENT_AMBULATORY_CARE_PROVIDER_SITE_OTHER): Admitting: Obstetrics & Gynecology

## 2021-01-09 ENCOUNTER — Ambulatory Visit (HOSPITAL_BASED_OUTPATIENT_CLINIC_OR_DEPARTMENT_OTHER): Admitting: Infectious Disease

## 2021-01-09 ENCOUNTER — Encounter

## 2021-01-09 ENCOUNTER — Encounter (HOSPITAL_BASED_OUTPATIENT_CLINIC_OR_DEPARTMENT_OTHER)

## 2021-01-11 ENCOUNTER — Other Ambulatory Visit

## 2021-01-12 ENCOUNTER — Other Ambulatory Visit

## 2021-01-14 ENCOUNTER — Encounter (INDEPENDENT_AMBULATORY_CARE_PROVIDER_SITE_OTHER)

## 2021-01-14 ENCOUNTER — Other Ambulatory Visit

## 2021-01-14 ENCOUNTER — Ambulatory Visit (HOSPITAL_BASED_OUTPATIENT_CLINIC_OR_DEPARTMENT_OTHER)

## 2021-01-14 ENCOUNTER — Encounter (INDEPENDENT_AMBULATORY_CARE_PROVIDER_SITE_OTHER): Admitting: Obstetrics & Gynecology

## 2021-01-14 DIAGNOSIS — Z3490 Encounter for supervision of normal pregnancy, unspecified, unspecified trimester: Secondary | ICD-10-CM

## 2021-01-14 NOTE — Progress Notes (Signed)
Procedure  performed. See Chart review for results.

## 2021-01-15 ENCOUNTER — Telehealth (INDEPENDENT_AMBULATORY_CARE_PROVIDER_SITE_OTHER): Admitting: Women's Health

## 2021-01-15 ENCOUNTER — Encounter (INDEPENDENT_AMBULATORY_CARE_PROVIDER_SITE_OTHER)

## 2021-01-15 ENCOUNTER — Ambulatory Visit (INDEPENDENT_AMBULATORY_CARE_PROVIDER_SITE_OTHER): Admitting: Obstetrics & Gynecology

## 2021-01-15 ENCOUNTER — Ambulatory Visit (INDEPENDENT_AMBULATORY_CARE_PROVIDER_SITE_OTHER): Admitting: Women's Health

## 2021-01-15 VITALS — BP 108/73 | Wt 159.4 lb

## 2021-01-15 DIAGNOSIS — Z3A20 20 weeks gestation of pregnancy: Secondary | ICD-10-CM

## 2021-01-15 LAB — POCT URINALYSIS DIPSTICK
Glucose, UA, POC: NEGATIVE
Leukocytes, UA, POC: NEGATIVE
Nitrite, UA, POC: NEGATIVE
Protein, UA, POC: NEGATIVE

## 2021-01-15 NOTE — Progress Notes (Signed)
She feels well at this visit  Denies LOF, vaginal bleeding, pelvic pain, contractions  pt is interested in planned c-section versus vaginal delivery - will discuss with dr Dorna Bloom  Had covid last week but doing well now, no symptoms currently other than slight congestion    [place] ysterday good growth per patient, report not finalized yet  Has another [place] on 5/16    [xxx] 18 wks FS - 4/22 @ [facility] bos - limited views, f/u 01/14/21 @ [address]- suboptimal heart views for baby B only, f/u @ fetal echo (this will complete survey)  [xxx] fetal echo- 01/21/21 @ 1pm pedi cardio @ [facility] bos   [xxx] GS every 2 weeks to check twins growth/fluid          [xxx] 01/28/21 @ 9:30am [address]           [xxx] 24 wk-02/12/21 @ 10:45am @50  [address]e          [xxx] 28 wk-03/12/21 @ 1pm [address]          [xxx] 32 wk-04/09/21 @ 10:45am [address]          [xxx] 36 wk-05/06/21 @ 10:45am [address]     reviewed 18wks FS & genetic screen result. adv PNC & sleep on her side and discussed diet, exercise, activity, sex and finding a pediatrician.

## 2021-01-15 NOTE — Progress Notes (Signed)
Visit Vitals  OB Status Pregnant

## 2021-01-15 NOTE — Telephone Encounter (Signed)
Entry in error

## 2021-01-17 ENCOUNTER — Telehealth (HOSPITAL_BASED_OUTPATIENT_CLINIC_OR_DEPARTMENT_OTHER): Admitting: Infectious Disease

## 2021-01-17 ENCOUNTER — Ambulatory Visit: Attending: Infectious Disease | Admitting: Infectious Disease

## 2021-01-17 ENCOUNTER — Encounter (HOSPITAL_BASED_OUTPATIENT_CLINIC_OR_DEPARTMENT_OTHER): Admitting: Infectious Disease

## 2021-01-17 ENCOUNTER — Other Ambulatory Visit

## 2021-01-17 VITALS — BP 112/60 | HR 97 | Temp 96.9°F | Resp 15 | Ht 61.0 in | Wt 168.0 lb

## 2021-01-17 DIAGNOSIS — A319 Mycobacterial infection, unspecified: Secondary | ICD-10-CM

## 2021-01-17 DIAGNOSIS — O98812 Other maternal infectious and parasitic diseases complicating pregnancy, second trimester: Secondary | ICD-10-CM

## 2021-01-17 NOTE — Progress Notes (Signed)
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                          No antimycobacterial therapy now  ?                          "heat therapy" To lesion BID  15 min        Paige [name] works at [employer] the diverse array of animals there  Including monkeys, birds, and reptiles.    About a year ago she developed a lesion on the dorsum of her right fourth finger after presumed trauma.  1 x-ray showed a 3 mm metallic foreign body And she underwent excision [the pathology showed chronic inflammation and no foreign body was found]; I have been unable to see that x-ray.    She had antibiotic therapy with doxycycline on a few occasions and the lesion waxed and waned over the ensuing months.  She was going to have an excision and a skin graft but before that was carried out she sought out the opinion of Paige Docker MD, dermatologist, who recognized that this could be a case of" swimming pool granuloma", due to mycobacteria    A biopsy was performed in early March of this year and eventually a slow-growing mycobacteria was isolated at the state laboratory and this was identified as mycobacteria marinum on or about May 5.    She was asked to see me.  As it turns out she is pregnant with twins and is about 26 weeks.    Examination  ::     there is a reddened indurated somewhat flaky lesion on the dorsum of the fourth right finger just over the PIP joint.  There is no discharge.  The lesion is about 1 cm in diameter    There is no epitrochlear or axillary adenopathy and no erythema spreading out from the lesion.    Discussion; Classically, M.  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  The  original [place] [phone])  Reports called it "swimming pool granuloma",  as outbreaks were seen in children who spent much time in pools, that may have been under chlorinated.    The natural history was often to remit spontaneously in them.  For persisting lesions a variety of anti-infective therapies have been tried in case series including tetracyclines and more traditional antibiotic therapies designed  Against mycobacteria.    An aggressive regimen combines ethambutol, rifampin, and clarithromycin.    Side effects are not rare, and one wants to be certain the benefits outweigh the risks.    Heat therapy has been used as the bacteria is not particularly thermotolerant, which is why it tends only to grow on the cooler part of the extremities without penetration to deep structures.  The testing of such isolates, accurately, is done in only a few laboratories in the country.  Her isolate will be sent from  [place]  [facility] to the reference lab in [facility] for susceptibility testing.  That would take about 6 to 8 weeks and in the interim we can try heat therapy to see if we can get the lesion to regress; instituting chemotherapy only when she is closer to term if needed. Maternal to fetal transmission in" theory" could occur via contact;  I have found no such reports, however.    Thanks for having [place] see her in infectious disease clinic      Paige Beau, MD FIDSA    CC Paige [name], Paige [name], Paige [name]  [name]       This is an environmental mycobacteria and and does not pose a risk to staff in the office or OR staff at the time of delivery.

## 2021-01-17 NOTE — Telephone Encounter (Signed)
Called Darion and left a message that she make an appointment.  I scheduled her when Dr. Jake Bathe called, and she cancelled the appointment.

## 2021-01-18 ENCOUNTER — Other Ambulatory Visit

## 2021-01-21 ENCOUNTER — Encounter (HOSPITAL_BASED_OUTPATIENT_CLINIC_OR_DEPARTMENT_OTHER): Admitting: Pediatrics

## 2021-01-21 ENCOUNTER — Encounter (HOSPITAL_BASED_OUTPATIENT_CLINIC_OR_DEPARTMENT_OTHER)

## 2021-01-21 ENCOUNTER — Inpatient Hospital Stay (HOSPITAL_BASED_OUTPATIENT_CLINIC_OR_DEPARTMENT_OTHER): Admitting: Pediatrics

## 2021-01-21 ENCOUNTER — Other Ambulatory Visit

## 2021-01-21 ENCOUNTER — Ambulatory Visit
Admission: RE | Admit: 2021-01-21 | Discharge: 2021-01-21 | Disposition: A | Discharge status: Home / Self Care | Source: Ambulatory Visit | Attending: Pediatrics | Admitting: Pediatrics

## 2021-01-21 ENCOUNTER — Other Ambulatory Visit (HOSPITAL_BASED_OUTPATIENT_CLINIC_OR_DEPARTMENT_OTHER): Admitting: Pediatrics

## 2021-01-21 VITALS — BP 108/62 | Ht 61.54 in | Wt 168.7 lb

## 2021-01-21 DIAGNOSIS — R079 Chest pain, unspecified: Secondary | ICD-10-CM

## 2021-01-21 DIAGNOSIS — O30032 Twin pregnancy, monochorionic/diamniotic, second trimester: Secondary | ICD-10-CM

## 2021-01-21 NOTE — Progress Notes (Signed)
Paige [name] is a 40 y.o. female referred to [place] for fetal echocardiography due to her Monochorionic Diamniotic pregnancy.    She is at [redacted] weeks gestation.     HPI  Current Outpatient Medications   Medication Sig Dispense Refill   ? aspirin 81 mg capsule      ? CALCIUM CARBONATE ORAL Take 1 tablet by mouth in the morning.     ? cholecalciferol, vitamin D3, 50 mcg (2,000 unit) tablet,chewable Chew 2,000 Units in the morning.     ? PRENATAL 105-IRON-FOLIC AC-DHA ORAL Take 1 tablet by mouth in the morning.     ? ASPIRIN CHILDRENS ORAL      ? cetirizine (ZyrTEC) 10 mg tablet Take 10 mg by mouth in the morning. 1 by mouth daily for allergies     ? fluticasone (Flonase Allergy Relief) 50 mcg/actuation nasal spray Administer into affected nostril(s).     ? metFORMIN (Glucophage) 500 mg tablet Take 1 tablet by mouth in the morning and at bedtime.     ? metroNIDAZOLE (FlagyL) 500 mg tablet Take 500 mg by mouth in the morning, at noon, and at bedtime. Take 1 tablet by mouth twice a day to start on 11/17/20     ? mupirocin (Bactroban) 2 % ointment in the morning, at noon, and at bedtime.     ? progesterone (Prometrium) 200 mg capsule PLEASE SEE ATTACHED FOR DETAILED DIRECTIONS       No current facility-administered medications for this visit.     Allergies   Allergen Reactions   ? No Known Allergies          Past Medical History:   Diagnosis Date   ? Diabetes mellitus (CMS/HCC) 3017   ? STD (sexually transmitted disease) 2005   ? Tinea corporis 2006     Family History   Problem Relation Name Age of Onset   ? Atrial fibrillation Mother Mother 36   ? Hyperlipidemia Mother Mother    ? Arthritis Mother Mother    ? Hyperlipidemia Father Father    ? Nephrolithiasis Father Father    ? Vision loss Father Father    ? No Known Problems Sister     ? Other (stomach issues) Sister     ? Hyperlipidemia Brother     ? Heart disease Neg Hx          No known family congenital heart disease or cardiac history.       Physical Examination:  Vitals:     01/21/21 1352   BP: 108/62     Growth percentile SmartLinks can only be used for patients less than 10 years old.     Diagnosis    Diagnosis Plan   1. Monochorionic diamniotic twin gestation in second trimester          Impression:      I spent 45 minutes with this patient.  Briefly, her fetal echo performed today was technically challenging and somewhat limited.   Her fetal echo for twin A showed normal fetal cardiac structures, normally related great arteries. There were at list 2 pulmonary veins seen in their customary positions.  Her fetal echo for twin A showed normal fetal cardiac structures, normally related great arteries. There were at list 2 pulmonary veins seen in their customary positions.  Please refer to fetal echo report for details.  The results of the study, its' limitations, and the limitations of fetal cardiac ultrasound in general were discussed with the parents. They understand that this  technology cannot rule out minor valvar and many septal defects. As well, it cannot predict normal postnatal closure of the ductus arteriosus or foramen ovale, or the development of coarctation of the aorta.  We discussed the limitation of detecting critical COA by fetal echo. Symptoms (combination of poor feeding, poor urine output, and resp distress) of potential critical COA at the first 2 weeks of life were discussed with Paige [name].

## 2021-01-25 ENCOUNTER — Encounter (HOSPITAL_BASED_OUTPATIENT_CLINIC_OR_DEPARTMENT_OTHER)

## 2021-01-28 ENCOUNTER — Encounter (HOSPITAL_BASED_OUTPATIENT_CLINIC_OR_DEPARTMENT_OTHER)

## 2021-01-28 ENCOUNTER — Encounter (INDEPENDENT_AMBULATORY_CARE_PROVIDER_SITE_OTHER)

## 2021-01-28 ENCOUNTER — Other Ambulatory Visit

## 2021-01-28 ENCOUNTER — Ambulatory Visit (HOSPITAL_BASED_OUTPATIENT_CLINIC_OR_DEPARTMENT_OTHER)

## 2021-01-28 DIAGNOSIS — O30032 Twin pregnancy, monochorionic/diamniotic, second trimester: Secondary | ICD-10-CM

## 2021-01-28 NOTE — Progress Notes (Signed)
Procedure  performed. See Chart review for results.

## 2021-02-04 ENCOUNTER — Encounter (HOSPITAL_BASED_OUTPATIENT_CLINIC_OR_DEPARTMENT_OTHER)

## 2021-02-04 ENCOUNTER — Encounter (HOSPITAL_BASED_OUTPATIENT_CLINIC_OR_DEPARTMENT_OTHER): Admitting: Specialist

## 2021-02-05 ENCOUNTER — Encounter (INDEPENDENT_AMBULATORY_CARE_PROVIDER_SITE_OTHER): Admitting: Obstetrics & Gynecology

## 2021-02-06 ENCOUNTER — Encounter (HOSPITAL_BASED_OUTPATIENT_CLINIC_OR_DEPARTMENT_OTHER): Admitting: Specialist

## 2021-02-06 ENCOUNTER — Encounter (HOSPITAL_BASED_OUTPATIENT_CLINIC_OR_DEPARTMENT_OTHER)

## 2021-02-07 ENCOUNTER — Telehealth (INDEPENDENT_AMBULATORY_CARE_PROVIDER_SITE_OTHER)

## 2021-02-07 ENCOUNTER — Encounter (INDEPENDENT_AMBULATORY_CARE_PROVIDER_SITE_OTHER)

## 2021-02-07 NOTE — Telephone Encounter (Signed)
Pt called, saying she was advised to have second Covid booster at her last prenatal visit, but her husband and 42yo child have recently developed Covid. She is asymptomatic, but she is asking about the timing of the booster: should she wait until her family quarantine ends?   I told pt I would route this question to Dr Dorna Bloom. She says she can wait until her OV next week to discuss further.

## 2021-02-11 NOTE — Telephone Encounter (Signed)
yes

## 2021-02-12 ENCOUNTER — Encounter

## 2021-02-12 ENCOUNTER — Encounter (INDEPENDENT_AMBULATORY_CARE_PROVIDER_SITE_OTHER): Admitting: Specialist

## 2021-02-12 ENCOUNTER — Other Ambulatory Visit

## 2021-02-12 ENCOUNTER — Encounter (INDEPENDENT_AMBULATORY_CARE_PROVIDER_SITE_OTHER)

## 2021-02-12 ENCOUNTER — Other Ambulatory Visit (INDEPENDENT_AMBULATORY_CARE_PROVIDER_SITE_OTHER)

## 2021-02-12 ENCOUNTER — Ambulatory Visit (INDEPENDENT_AMBULATORY_CARE_PROVIDER_SITE_OTHER): Admitting: Obstetrics & Gynecology

## 2021-02-12 VITALS — BP 107/70 | Wt 173.0 lb

## 2021-02-12 DIAGNOSIS — O0992 Supervision of high risk pregnancy, unspecified, second trimester: Secondary | ICD-10-CM

## 2021-02-12 LAB — POCT URINALYSIS DIPSTICK
Glucose, UA, POC: NEGATIVE
Leukocytes, UA, POC: NEGATIVE
Nitrite, UA, POC: NEGATIVE
Protein, UA, POC: NEGATIVE

## 2021-02-12 NOTE — Progress Notes (Signed)
Visit Vitals  BP 107/70   Wt 78.5 kg   BMI 32.12 kg/m?   OB Status Pregnant   BSA 1.85 m?   3 weeks    Day 12 of covid - QUan T in 4 weeks in HV

## 2021-02-13 ENCOUNTER — Encounter (INDEPENDENT_AMBULATORY_CARE_PROVIDER_SITE_OTHER)

## 2021-02-20 ENCOUNTER — Other Ambulatory Visit

## 2021-02-25 ENCOUNTER — Other Ambulatory Visit

## 2021-02-26 ENCOUNTER — Other Ambulatory Visit

## 2021-02-27 ENCOUNTER — Encounter (HOSPITAL_BASED_OUTPATIENT_CLINIC_OR_DEPARTMENT_OTHER)

## 2021-02-27 ENCOUNTER — Ambulatory Visit (HOSPITAL_BASED_OUTPATIENT_CLINIC_OR_DEPARTMENT_OTHER)

## 2021-02-27 ENCOUNTER — Encounter

## 2021-02-27 DIAGNOSIS — O30033 Twin pregnancy, monochorionic/diamniotic, third trimester: Secondary | ICD-10-CM

## 2021-02-27 NOTE — Progress Notes (Signed)
Procedure  performed. See Chart review for results.

## 2021-03-01 ENCOUNTER — Encounter (INDEPENDENT_AMBULATORY_CARE_PROVIDER_SITE_OTHER)

## 2021-03-04 ENCOUNTER — Other Ambulatory Visit

## 2021-03-05 ENCOUNTER — Encounter

## 2021-03-05 ENCOUNTER — Ambulatory Visit: Attending: Obstetrics & Gynecology | Admitting: Obstetrics & Gynecology

## 2021-03-05 ENCOUNTER — Other Ambulatory Visit

## 2021-03-05 ENCOUNTER — Ambulatory Visit (INDEPENDENT_AMBULATORY_CARE_PROVIDER_SITE_OTHER): Admitting: Obstetrics & Gynecology

## 2021-03-05 DIAGNOSIS — Z331 Pregnant state, incidental: Secondary | ICD-10-CM

## 2021-03-05 LAB — POCT URINALYSIS DIPSTICK
Glucose, UA, POC: NEGATIVE
Leukocytes, UA, POC: NEGATIVE
Nitrite, UA, POC: NEGATIVE

## 2021-03-05 LAB — CBC WITH DIFFERENTIAL
Basophils %: 0.2 %
Basophils Absolute: 0.02 10*3/uL (ref 0.00–0.22)
Eosinophils %: 1.3 %
Eosinophils Absolute: 0.14 10*3/uL (ref 0.00–0.50)
Hematocrit: 34.8 % (ref 32.0–47.0)
Hemoglobin: 11.1 g/dL (ref 11.0–16.0)
Immature Granulocytes %: 1.1 %
Immature Granulocytes Absolute: 0.12 10*3/uL — ABNORMAL HIGH (ref 0.00–0.10)
Lymphocyte %: 19 %
Lymphocytes Absolute: 2.11 10*3/uL (ref 0.70–4.00)
MCH: 29.8 pg (ref 26.0–34.0)
MCHC: 31.9 g/dL (ref 31.0–37.0)
MCV: 93.3 fL (ref 80.0–100.0)
MPV: 10.6 fL (ref 9.1–12.4)
Monocytes %: 4.7 %
Monocytes Absolute: 0.52 10*3/uL (ref 0.36–0.77)
NRBC %: 0 % (ref 0.0–0.0)
NRBC Absolute: 0 10*3/uL (ref 0.00–2.00)
Neutrophil %: 73.7 %
Neutrophils Absolute: 8.21 10*3/uL — ABNORMAL HIGH (ref 1.50–7.95)
Platelets: 227 10*3/uL (ref 150–400)
RBC: 3.73 M/uL (ref 3.70–5.20)
RDW-CV: 14.1 % (ref 11.5–14.5)
RDW-SD: 48 fL (ref 35.0–51.0)
WBC: 11.1 10*3/uL — ABNORMAL HIGH (ref 4.0–11.0)

## 2021-03-05 LAB — GLUCOSE TOLERANCE PRENATAL 1 HOUR: Glucose, 1 hour: 180 mg/dL — ABNORMAL HIGH (ref <=140)

## 2021-03-05 NOTE — Progress Notes (Signed)
Patient ID: Paige [name] is a 39 y.o. female [phone] who presents for [redacted]w[redacted]d OB visit.    Subjective     There were no vitals filed for this visit.    Pt denies LOF, vaginal bleeding, contractions, and pelvic pain.    Approved from home one day on computer    During pregnancy   Postpartum Depression: Low Risk    ? Last EPDS Total Score: 2   ? Last EPDS Self Harm Result: Never       1 hr GLT/CBC/RPR done today, s/s PET/PTL/FMC reviewed  [facility] GTT handout given  Venipuncture done today  Blood type A+    EPDS = 2    [dd]    breast feeding reviewed and information given.consent signed.  she has no questions and she plans to breast feed after giving birth. RX for breast pump given  Cord blood banking -     RTO in 2 weeks  Visit Vitals  BP 113/74   Ht 1.563 m   Wt 80.3 kg   BMI 32.86 kg/m?   OB Status Pregnant   BSA 1.87 m?     Dr. Dorna Bloom provided supervision for this service and is in agreement with the assessment and plan as stated above.     signed Ferd Hibbs, NP    signed Richarda Osmond. Vinal Rosengrant. MD     36 -37  26 week off    She covid and 2ndbooster

## 2021-03-06 ENCOUNTER — Telehealth (INDEPENDENT_AMBULATORY_CARE_PROVIDER_SITE_OTHER)

## 2021-03-06 ENCOUNTER — Encounter (HOSPITAL_BASED_OUTPATIENT_CLINIC_OR_DEPARTMENT_OTHER): Admitting: Specialist

## 2021-03-06 ENCOUNTER — Encounter (HOSPITAL_BASED_OUTPATIENT_CLINIC_OR_DEPARTMENT_OTHER)

## 2021-03-06 ENCOUNTER — Ambulatory Visit: Attending: Obstetrics & Gynecology

## 2021-03-06 ENCOUNTER — Encounter (INDEPENDENT_AMBULATORY_CARE_PROVIDER_SITE_OTHER)

## 2021-03-06 DIAGNOSIS — Z331 Pregnant state, incidental: Secondary | ICD-10-CM

## 2021-03-06 LAB — GTT, FASTING: Glucose, fasting: 82 mg/dL (ref 70–110)

## 2021-03-06 LAB — GTT 3 HOUR: Glucose, 3 hour: 156 mg/dL

## 2021-03-06 LAB — GTT 2 HOUR: Glucose, 2 hour: 142 mg/dL — ABNORMAL LOW (ref 155–450)

## 2021-03-06 LAB — GTT 1 HOUR: Glucose, 1 hour: 204 mg/dL (ref 180–450)

## 2021-03-06 LAB — RPR QUALITATIVE: RPR, Qualitative: NONREACTIVE

## 2021-03-06 NOTE — Result Quicknote (Signed)
Informed patient that she has diagnosis DM

## 2021-03-06 NOTE — Telephone Encounter (Signed)
Spoke to pt w/ dx of GDM  pt will be scheduled for ENDO and MFM appointments..   Pt will be in [place] tomorrow and Friday, so I will see if a telehealth is possible with the diabetic nurse educator. Pt reports that she had GDM before in her last pregnancy, and she has started testing her blood sugars.  Pt verbalizes understanding. All questions answered.

## 2021-03-07 ENCOUNTER — Telehealth (INDEPENDENT_AMBULATORY_CARE_PROVIDER_SITE_OTHER)

## 2021-03-07 NOTE — Telephone Encounter (Signed)
LM [Pt is in [place], and she cannot have a telehealth with Endo for GDM when she is out of state for insurance reasons. She is scheduled with the diabetic nurse educator 03/13/21 @ 8am. I left their number in case she needs to change this]

## 2021-03-10 ENCOUNTER — Other Ambulatory Visit

## 2021-03-11 ENCOUNTER — Encounter (INDEPENDENT_AMBULATORY_CARE_PROVIDER_SITE_OTHER): Admitting: Obstetrics & Gynecology

## 2021-03-12 ENCOUNTER — Encounter (INDEPENDENT_AMBULATORY_CARE_PROVIDER_SITE_OTHER)

## 2021-03-12 ENCOUNTER — Encounter

## 2021-03-12 ENCOUNTER — Encounter (INDEPENDENT_AMBULATORY_CARE_PROVIDER_SITE_OTHER): Admitting: Specialist

## 2021-03-12 ENCOUNTER — Other Ambulatory Visit

## 2021-03-12 ENCOUNTER — Other Ambulatory Visit (INDEPENDENT_AMBULATORY_CARE_PROVIDER_SITE_OTHER)

## 2021-03-13 ENCOUNTER — Encounter (INDEPENDENT_AMBULATORY_CARE_PROVIDER_SITE_OTHER)

## 2021-03-13 ENCOUNTER — Ambulatory Visit: Attending: Internal Medicine

## 2021-03-13 ENCOUNTER — Telehealth (HOSPITAL_BASED_OUTPATIENT_CLINIC_OR_DEPARTMENT_OTHER): Admitting: Internal Medicine

## 2021-03-13 ENCOUNTER — Encounter (HOSPITAL_BASED_OUTPATIENT_CLINIC_OR_DEPARTMENT_OTHER)

## 2021-03-13 DIAGNOSIS — O24410 Gestational diabetes mellitus in pregnancy, diet controlled: Secondary | ICD-10-CM

## 2021-03-13 MED ORDER — lancets 33 gauge misc
33 | 3 refills | Status: DC
Start: 2021-03-13 — End: 2021-06-23

## 2021-03-13 MED ORDER — blood sugar diagnostic (OneTouch Verio test strips) strip
ORAL_STRIP | 3 refills | Status: DC
Start: 2021-03-13 — End: 2021-06-23

## 2021-03-13 MED ORDER — blood-glucose meter (OneTouch Verio Flex meter) misc
0 refills | 30.00000 days | Status: DC
Start: 2021-03-13 — End: 2021-06-23

## 2021-03-13 NOTE — Telephone Encounter (Signed)
Patient needs an insurance referral.    MD: Rosina Lowenstein, MD  NPI: [id number]  DOS: 03/27/21  DX: Shelton Silvas DM

## 2021-03-13 NOTE — Addendum Note (Signed)
 Addended by: Dion Body on: 03/13/2021 12:36 PM     Modules accepted: Orders

## 2021-03-13 NOTE — Telephone Encounter (Signed)
-----   Message from Rosina Lowenstein, MD sent at 03/13/2021 11:48 AM EDT -----  Regarding: RE: Gest DM  Should be fine,thanks  ----- Message -----  From: Lolly Mustache  Sent: 03/13/2021   9:51 AM EDT  To: Rosina Lowenstein, MD  Subject: Gest DM                                          Dr. Tedra Senegal - I needed to over book 7/13 @ 215p for this PT. She has Gest DM and was a PT of yours during her previous Gest DM. If you are unable to stay late that day please let me know. Thanks

## 2021-03-13 NOTE — Progress Notes (Signed)
Diabetes Education ([facility]) Clinic Note    Paige [name] is a 40 y.o. G4P2; twin girls and has 2 boys at home 4,7. EDD 9/16 but expects delivery more the end of August. She was + gestational diabetes with her second pregnancy; tx with diet. Has seen Dr Tedra Senegal for PCOS.  She presents to learn about her gestational diabetes, diet and meter via telehealth.    PCP:  Referring Physician: Frances Furbish, MD      Past Medical History:  Patient Active Problem List   Diagnosis   ? Encounter for gynecological examination (general) (routine) without abnormal findings   ? Eczema   ? Allergy, unspecified, initial encounter   ? Encounter for screening for malignant neoplasm of cervix   ? Hyperlipidemia   ? Twin pregnancy, unspecified number of placenta and unspecified number of amniotic sacs, unspecified trimester   ? Vitamin D deficiency   ? Personal history of gestational diabetes   ? Supervision of high risk pregnancy in second trimester   ? [redacted] weeks gestation of pregnancy   ? Supervision of elderly multigravida, third trimester   ? Vesicular skin lesions   ? Deviated nasal septum   ? Post-nasal drip   ? Obesity, unspecified   ? Melanocytic nevi, unspecified   ? Polycystic ovarian syndrome        Medications:    Current Outpatient Medications:   ?  aspirin 81 mg capsule, , Disp: , Rfl:   ?  CALCIUM CARBONATE ORAL, Take 1 tablet by mouth in the morning., Disp: , Rfl:   ?  cetirizine (ZyrTEC) 10 mg tablet, Take 10 mg by mouth in the morning. 1 by mouth daily for allergies, Disp: , Rfl:   ?  cholecalciferol, vitamin D3, 50 mcg (2,000 unit) tablet,chewable, Chew 2,000 Units in the morning., Disp: , Rfl:   ?  mupirocin (Bactroban) 2 % ointment, in the morning, at noon, and at bedtime., Disp: , Rfl:   ?  PRENATAL 105-IRON-FOLIC AC-DHA ORAL, Take 1 tablet by mouth in the morning., Disp: , Rfl:     Allergies:  Allergies   Allergen Reactions   ? No Known Allergies        Family History:  No family hx diabetes      Social  History:  Is Medical sales representative at [employer]; lives with husband and 2 boys 4,7.       Review of Data:  OGTT: 82/204/142/156     Diabetes Education Addressed:  Education Documentation  Identify Personal Blood Glucose Target, taught by Dion Body, RN at 03/13/2021  9:01 AM.  Learner: Patient  Readiness: Acceptance  Method: Explanation, Demonstration  Response: Verbalizes Understanding    Identify Recommended Blood Glucose Targets, taught by Dion Body, RN at 03/13/2021  9:01 AM.  Learner: Patient  Readiness: Acceptance  Method: Explanation, Demonstration  Response: Verbalizes Understanding    State Effect of Exercise of Blood Glucose Level, taught by Dion Body, RN at 03/13/2021  9:01 AM.  Learner: Patient  Readiness: Acceptance  Method: Explanation, Demonstration  Response: Verbalizes Understanding    Describe Effect of Type, Amount, and Timing of Food on Blood Glucose, taught by Dion Body, RN at 03/13/2021  9:01 AM.  Learner: Patient  Readiness: Acceptance  Method: Explanation, Demonstration  Response: Verbalizes Understanding    List 3 Options for Planning Meals, taught by Dion Body, RN at 03/13/2021  9:01 AM.  Learner: Patient  Readiness: Acceptance  Method: Explanation, Demonstration  Response: Verbalizes Understanding  List 3 Options for Treating Diabetes, taught by Dion Body, RN at 03/13/2021  9:01 AM.  Learner: Patient  Readiness: Acceptance  Method: Explanation, Demonstration  Response: Verbalizes Understanding    Identify Own Type of Diabetes, taught by Dion Body, RN at 03/13/2021  9:01 AM.  Learner: Patient  Readiness: Acceptance  Method: Explanation, Demonstration  Response: Verbalizes Understanding    Define Diabetes, taught by Dion Body, RN at 03/13/2021  9:01 AM.  Learner: Patient  Readiness: Acceptance  Method: Explanation, Demonstration  Response: Verbalizes Understanding    Education Comments  No comments found.            Diabetes Self Management Behaviors:   MONITORING:   Will order  supplies via protocol and PT will connect to clinic. Has used Verio previously.    MEDICATIONS:  Lifestyle    EATING:   PT eats 6 smaller meals/day and has been 80% Mediterranean meal style but since pregnancy is incorporating more meat and feels better at this time.    Breakfast: Coffee with half and half; 2 eggs, low carb wrap; beans, guacamole or a protein powder based smoothie with veggies and small piece of fruit.    Lunch: Leftovers; salads with proteins, chic peas and protein bar    Supper: taco's, beans, salsa, cheese; protein and veggies    Snacks: nuts, fruits,cheese stix, pretzels; crackers and cheese    ACTIVITY/EXERCISE:  Works 5 days at [employer] and lots of movement and walking. Will be cutting back to 4 days in 2 weeks.      COPING:   Acceptance of guidelines      Recommendations and Care Plan:     1. Start to test 4x/day per gestational protocol  2. Connect to OT clinic  3. Meal plan is               15-30 carbs at breakfast and spread out your fruit to mid morning              30-45 carbs at lunch/supper              15-30 carbs at snacks  4. Call for appt to provider and CDE follow up        Dion Body BSN, RN, CDCES   Certified Diabetes Care and Education Specialist

## 2021-03-18 ENCOUNTER — Other Ambulatory Visit

## 2021-03-19 ENCOUNTER — Other Ambulatory Visit (HOSPITAL_BASED_OUTPATIENT_CLINIC_OR_DEPARTMENT_OTHER)

## 2021-03-19 ENCOUNTER — Encounter

## 2021-03-19 ENCOUNTER — Ambulatory Visit (INDEPENDENT_AMBULATORY_CARE_PROVIDER_SITE_OTHER): Admitting: Obstetrics & Gynecology

## 2021-03-19 VITALS — BP 104/70 | Ht 61.54 in | Wt 176.0 lb

## 2021-03-19 DIAGNOSIS — O30009 Twin pregnancy, unspecified number of placenta and unspecified number of amniotic sacs, unspecified trimester: Secondary | ICD-10-CM

## 2021-03-19 LAB — POCT URINALYSIS DIPSTICK
Glucose, UA, POC: NEGATIVE
Leukocytes, UA, POC: NEGATIVE
Nitrite, UA, POC: NEGATIVE
Protein, UA, POC: NEGATIVE

## 2021-03-19 MED ORDER — lancets 30 gauge misc
30 | 1 refills | Status: DC
Start: 2021-03-19 — End: 2021-03-23

## 2021-03-19 NOTE — Addendum Note (Signed)
 Addended by: Sherald Barge on: 03/27/2021 10:39 AM     Modules accepted: Orders

## 2021-03-19 NOTE — Telephone Encounter (Signed)
-----   Message from [place] D. Lewallen sent at 03/19/2021 10:09 AM EDT -----  Regarding: Prescription for test strips  Hi nurse Alice,  I wanted to let you know the lancets you sent to the pharmacy to not match the thing that you out them in.  The thing that came with the machine is the delica plus.  I also have the delica at home from last time.  You sent the soft touch lancet I believe it?s called.  I can use up what I have from the last pregnancy but I think I will need another box of either the delica plus or regular.  Thank you!  Paige [name]

## 2021-03-19 NOTE — Progress Notes (Signed)
 Spoke to panda  She should have 2nd booster now

## 2021-03-19 NOTE — Progress Notes (Signed)
 Tdap given today 03/19/21    Visit Vitals  BP 104/70   Ht 1.563 m   Wt 79.8 kg   BMI 32.68 kg/m?   OB Status Pregnant   BSA 1.86 m?     03/20/2021 7:50 AM   Advice to get 2nd covid booster

## 2021-03-20 ENCOUNTER — Encounter (INDEPENDENT_AMBULATORY_CARE_PROVIDER_SITE_OTHER)

## 2021-03-20 ENCOUNTER — Encounter (INDEPENDENT_AMBULATORY_CARE_PROVIDER_SITE_OTHER): Admitting: Obstetrics & Gynecology

## 2021-03-20 NOTE — Progress Notes (Signed)
 Called PT as it looked like she wasn't connected but is now. Her data shows at target levels. There was a false high in AM. Will see endo next week and knows she will be followed weekly.

## 2021-03-22 ENCOUNTER — Other Ambulatory Visit

## 2021-03-22 ENCOUNTER — Other Ambulatory Visit (HOSPITAL_BASED_OUTPATIENT_CLINIC_OR_DEPARTMENT_OTHER): Admitting: Internal Medicine

## 2021-03-22 NOTE — Telephone Encounter (Signed)
 rx already filled by another provider 6/29

## 2021-03-26 ENCOUNTER — Other Ambulatory Visit

## 2021-03-27 ENCOUNTER — Encounter (HOSPITAL_BASED_OUTPATIENT_CLINIC_OR_DEPARTMENT_OTHER): Admitting: Internal Medicine

## 2021-03-27 ENCOUNTER — Encounter (INDEPENDENT_AMBULATORY_CARE_PROVIDER_SITE_OTHER)

## 2021-03-27 ENCOUNTER — Other Ambulatory Visit

## 2021-03-27 ENCOUNTER — Ambulatory Visit (HOSPITAL_BASED_OUTPATIENT_CLINIC_OR_DEPARTMENT_OTHER)

## 2021-03-27 ENCOUNTER — Ambulatory Visit: Attending: Internal Medicine | Admitting: Internal Medicine

## 2021-03-27 VITALS — Ht 60.0 in | Wt 175.0 lb

## 2021-03-27 DIAGNOSIS — O30033 Twin pregnancy, monochorionic/diamniotic, third trimester: Secondary | ICD-10-CM

## 2021-03-27 DIAGNOSIS — E282 Polycystic ovarian syndrome: Secondary | ICD-10-CM

## 2021-03-27 NOTE — Progress Notes (Signed)
 [facility]  [address]  [place] [address]  Dept Phone: [phone]  Dept Fax: [phone]     Endocrinology Clinic Note    Referring Physician: Frances Furbish, MD    Dr Dorna Bloom    History of present illness:  Paige [name] is a 40 y.o. female G4P2 PMH of PCOS,HL here for initial consultation of GDM    Pregnant twins 30 weeks (girl).  She has been seeing our diabetes educator Alice.  Her blood sugars are all in target    Prior to this I have seen her in 2019 with history of gestational diabetes.  Was on metformin in the first trimester, then gestational diabetes was diet controlled    40 y/o boy-7lb 1 ounce, was born in [place], no GDM then but was on metformin until 14 weeks and was on metformin even in this pregnancy until 14 weeks and was stopped by Dr Dorna Bloom    40 y/o boy- 6lb 12 ounces-GDM, diet controlled      PCOS Diagnosed 8 years ago based on oligomenorrhea, no acne/hirsuitism-clomiphene/IUI second preg first pregnancy she lost the bay at 9 weeks,this preg-no clomiphene but was on metformin. 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,married      Assessment/Plan:  Paige [name] is a 40 y.o. female G4P2 PMH of PCOS,HL here for initial consultation of GDM    Gestational diabetes: A1C 5.3% currently seeing a diabetes educator, gestational diabetes well controlled, requiring insulin she is [redacted] weeks pregnant with twins, continue follow-up with CDE unless blood sugars start to become out of range.  I do recommend follow-up postpartum with labs.    PCO S: Currently off of metformin .  Metformin will be reinitiated after she stops breast-feeding.Teso <40 discussed plant based diet,resources given ,This has benefited her, menses are regular, LDL up again, breast feeding, strong family history of hypercholesterolemia.  Currently not on any meds.    Thank you for involving in this patient's care. Please do not hesitate to  contact with questions or concerns         Rosina Lowenstein, MD         Pertinent lab review:    No results found for: HGBA1C   No results found for: GLUCOSE, CALCIUM, NA, K, CO2, CL, BUN, CREATININE   No results found for: LDLCALC   No results found for: HGBA1C  Lab Results   Component Value Date    GLUF 82 03/06/2021        No results found for: ALT, AST, GGT, ALKPHOS, BILITOT        HX HEMOGLOBIN A1C   Date/Time Value Ref Range Status   10/15/2020 10:55 AM 5.3 <=5.6 % Final     Comment:     Non-diabetic: < 6.0%   Patients with diabetes: < 7.0%         Action suggested: > 8.0%     04/06/2019 12:00 AM 5.1 <=5.6 % Final     Comment:     Non-diabetic: < 6.0 %  Diabetic: Goal:  < 7.0%  Action suggested:  > 8.0%     05/12/2018 08:38 AM 5.2 <=5.6 % Final     Comment:     Non-diabetic: < 6.0 %  Diabetic: Goal:  < 7.0%  Action suggested:  > 8.0%     10/28/2017 07:30 AM 5.2 <=5.6 % Final     Comment:  Non-diabetic: < 6.0 %  Diabetic: Goal:  < 7.0%  Action suggested:  > 8.0%       HX LDL CHOLESTEROL   Date/Time Value Ref Range Status   04/06/2019 12:00 AM 154.0 (H) <=130 mg/dL Final   96/29/5284 13:24 AM 204.0 (H) <=130 mg/dL Final     HX CHOLESTEROL (LIPR)   Date/Time Value Ref Range Status   04/06/2019 12:00 AM 235.0 (H) <=200 mg/dL Final     HX HDL CHOLESTEROL   Date/Time Value Ref Range Status   04/06/2019 12:00 AM 43.0 >=40 mg/dL Final     HX CREATININE   Date/Time Value Ref Range Status   04/06/2019 12:00 AM 0.73 0.55 - 1.3 mg/dL Final     HX TSH WITH REFLEX   Date/Time Value Ref Range Status   05/12/2018 08:38 AM 0.913 0.358 - 3.74 mclU/ml Final     Comment:     1.  It is generally recommended that thyroid testing be  avoided in hospitalized patients or deferred until recovery  from an acute illness.  2.  When thyroid medication dose is adjusted, the American  Thyroid Association recommends waiting 6 to 8 weeks before  testing TSH levels again.  or endocrinologist.  3.  In patients over the age of 60, the  actionable threshold  for TSH may be slightly higher (suggested >6.0 mcIU/ml).  Please consult with a pathologist or endocrinologist.  4.  In pregnancy, if TSH is abnormal, additional testing is  suggested (to include free T4 by equilibrium dialysis). For  additional testing information, consult with a pathologist          Medical History:    Past Medical History:   Diagnosis Date   ? Diabetes mellitus (CMS/HCC) 3017   ? STD (sexually transmitted disease) 2005   ? Tinea corporis 2006         Surgical History:    Past Surgical History:   Procedure Laterality Date   ? D&C FIRST TRIMESTER / TX INCOMPLETE / MISSED / SEPTIC / INDUCED ABORTION N/A 2013    arcuate uterus          Family History:    @BIMFAMHISTORY @       Social History:    No valid surgical or medical questions entered.        Active medical problems:    Patient Active Problem List   Diagnosis   ? Encounter for gynecological examination (general) (routine) without abnormal findings   ? Eczema   ? Allergy, unspecified, initial encounter   ? Encounter for screening for malignant neoplasm of cervix   ? Hyperlipidemia   ? Twin pregnancy, unspecified number of placenta and unspecified number of amniotic sacs, unspecified trimester   ? Vitamin D deficiency   ? Personal history of gestational diabetes   ? Supervision of high risk pregnancy in second trimester   ? [redacted] weeks gestation of pregnancy   ? Supervision of elderly multigravida, third trimester   ? Vesicular skin lesions   ? Deviated nasal septum   ? Post-nasal drip   ? Obesity, unspecified   ? Melanocytic nevi, unspecified   ? Polycystic ovarian syndrome         Current medications:      Current Outpatient Medications:   ?  aspirin 81 mg capsule, , Disp: , Rfl:   ?  blood sugar diagnostic (OneTouch Verio test strips) strip, Wendall Mola, Disp: 200 strip, Rfl: 3  ?  blood sugar diagnostic (OneTouch Verio  test strips) strip, Wendall Mola, Disp: 200 strip, Rfl: 3  ?  blood-glucose meter (OneTouch Verio  Flex meter) misc, Wendall Mola, Disp: 1 each, Rfl: 0  ?  CALCIUM CARBONATE ORAL, Take 1 tablet by mouth in the morning., Disp: , Rfl:   ?  cetirizine (ZyrTEC) 10 mg tablet, Take 10 mg by mouth in the morning. 1 by mouth daily for allergies, Disp: , Rfl:   ?  cholecalciferol, vitamin D3, 50 mcg (2,000 unit) tablet,chewable, Chew 2,000 Units in the morning., Disp: , Rfl:   ?  lancets (OneTouch Delica Lancets) 30 gauge misc, TEST BLOOD SUGAR FOUR TIMES DAILY, Disp: 400 Lancet, Rfl: 3  ?  lancets 33 gauge misc, Wendall Mola, Disp: 200 Lancet, Rfl: 3  ?  mupirocin (Bactroban) 2 % ointment, in the morning, at noon, and at bedtime., Disp: , Rfl:   ?  PRENATAL 105-IRON-FOLIC AC-DHA ORAL, Take 1 tablet by mouth in the morning., Disp: , Rfl:        Allergies:    No Known Allergies         ROS:    No palpitations, no tremor, no heat intolerance. No cold intolerance.  No diarrhea or constipation. No change in hair or skin. No dysphagia, no odynophagia, no solid food or pill dysphagia. No diplopia/blurry vision.No fevers/chills.No change in voice.No anterior neck pressure. No increase in neck size.No wheeze or DOE.No rash.No edema. No cough.No anxiety/Depression.Regular menses Rest of ROS negative or non-contributory.       Physical Exam:    There were no vitals taken for this visit.   WGN:FAOZH alert no acute distress  HEENT: head atraumatic, normocephalic, no redness in eyes no discharge from nose  SKIN: facial skin does not show any cyanosis  RESP: speaking in full sentences, no increased respiratory effort seen  MSK: normal ROM in fingers  Neuro: facial nerves intact  Pysch: appearance, behavior and attitude-pleasant, cooperative and groomed            This note has been created with voice recognition software.  While this note has been edited for accuracy, this software periodically misinterprets speech resulting in errors that might not have been caught in editing.  In the event an unusual error is found in this  record, please notify us at  [phone] to resolve the error.

## 2021-03-27 NOTE — Progress Notes (Signed)
 Procedure  performed. See Chart review for results.

## 2021-03-29 ENCOUNTER — Encounter (INDEPENDENT_AMBULATORY_CARE_PROVIDER_SITE_OTHER)

## 2021-04-01 ENCOUNTER — Ambulatory Visit
Admission: RE | Admit: 2021-04-01 | Discharge: 2021-04-01 | Disposition: A | Discharge status: Home / Self Care | Attending: Obstetrics & Gynecology | Admitting: Obstetrics & Gynecology

## 2021-04-01 ENCOUNTER — Ambulatory Visit (HOSPITAL_BASED_OUTPATIENT_CLINIC_OR_DEPARTMENT_OTHER)

## 2021-04-01 ENCOUNTER — Encounter (HOSPITAL_BASED_OUTPATIENT_CLINIC_OR_DEPARTMENT_OTHER): Admitting: Obstetrics & Gynecology

## 2021-04-01 ENCOUNTER — Other Ambulatory Visit

## 2021-04-01 NOTE — Discharge Instructions (Signed)
 The Labor & Delivery Unit can be reached at [phone].    Call your physician or return to the hospital if you:    Think you may be in labor  Have regular contractions that get progressively closer, longer, and stronger.  If you are less than 37 weeks, call if you have more than 4 contractions in one hour or if you have menstrual-like cramps, a dull backache or pressure in your pelvis, back or thighs.  Your water breaks or you are leaking fluid  Note time and color  Notice an absence or marked decrease in fetal movement  Have bleeding or spotting  Have sudden, sharp abdominal pain  Have any severe headaches, dizziness, blurred vision or spots before your eyes  Have excessive swelling of your hands, face, or feet  Have chest pain/indigestion    Drink 10-12, 8 oz. Glasses of fluids (preferably water) per day.    Rest on your right or left side.

## 2021-04-01 NOTE — Procedures (Signed)
 OB NST Note    Name: Paige [name]  MRN: [id number]  DOB: [birth year]  GA: [redacted]w[redacted]d            Non-Stress Test 1  Reason for Non-Stress Test 1 : AMA, Multiple Gestation  Variability in Waveform for Non-Stress Test 1: Moderate  Decelerations in Non-Stress Test 1: None  Accelerations in Non-Stress Test 1: Yes  Baseline Fetal Heart Rate for Non-Stress Test 1: 140 BPM  Uterine Irritability for Non-Stress Test 1: No  Contractions in Non-Stress Test 1: Not present    Interpretation of Non-Stress Test 1  Interpretation of Non-Stress Test 1: Reactive  Multiple NSTs: Yes         Non-Stress Test 2  Variability in Waveform for Non-Stress Test 2: Moderate  Decelerations in Non-Stress Test 2: None  Accelerations in Non-Stress Test 2: Yes  Baseline Fetal Heart Rate for Non-Stress Test 2: 135 BPM  Uterine Irritability for Non-Stress Test 2: No  Contractions in Non-Stress Test 2: Not present    Interpretation of Non-Stress Test 2  Interpretation of Non-Stress Test 2: Reactive    BP 113/64   Pulse 91   Ht 1.549 m   Wt 79.4 kg   BMI 33.07 kg/m?       Next nst 7/24 at 1100. Dc instructions reviewed.     Gordan Payment, RN

## 2021-04-02 ENCOUNTER — Ambulatory Visit (INDEPENDENT_AMBULATORY_CARE_PROVIDER_SITE_OTHER): Admitting: Obstetrics & Gynecology

## 2021-04-02 ENCOUNTER — Encounter

## 2021-04-02 DIAGNOSIS — Z3A31 31 weeks gestation of pregnancy: Secondary | ICD-10-CM

## 2021-04-02 LAB — POCT URINALYSIS DIPSTICK
Glucose, UA, POC: NEGATIVE
Leukocytes, UA, POC: NEGATIVE
Nitrite, UA, POC: NEGATIVE
Protein, UA, POC: NEGATIVE

## 2021-04-02 NOTE — Progress Notes (Signed)
 Visit Vitals  BP 108/71   Wt 80.7 kg   BMI 33.63 kg/m?   OB Status Pregnant   BSA 1.86 m?

## 2021-04-03 ENCOUNTER — Encounter (INDEPENDENT_AMBULATORY_CARE_PROVIDER_SITE_OTHER)

## 2021-04-03 NOTE — Progress Notes (Signed)
 Reviewed her Ot Connect and all BS numbers are at target levels.

## 2021-04-04 ENCOUNTER — Other Ambulatory Visit

## 2021-04-05 ENCOUNTER — Encounter (INDEPENDENT_AMBULATORY_CARE_PROVIDER_SITE_OTHER)

## 2021-04-05 NOTE — Progress Notes (Signed)
 FMLA ppw completed and left at front desk  LM on vm to  informed PT and scanned into chart

## 2021-04-07 ENCOUNTER — Encounter (HOSPITAL_BASED_OUTPATIENT_CLINIC_OR_DEPARTMENT_OTHER): Admitting: Obstetrics & Gynecology

## 2021-04-07 ENCOUNTER — Ambulatory Visit (HOSPITAL_BASED_OUTPATIENT_CLINIC_OR_DEPARTMENT_OTHER)

## 2021-04-07 ENCOUNTER — Other Ambulatory Visit

## 2021-04-07 ENCOUNTER — Ambulatory Visit
Admission: RE | Admit: 2021-04-07 | Discharge: 2021-04-07 | Disposition: A | Discharge status: Home / Self Care | Source: Ambulatory Visit | Attending: Obstetrics & Gynecology | Admitting: Obstetrics & Gynecology

## 2021-04-07 NOTE — Procedures (Signed)
 OB NST Note    Name: LANEY LOUDERBACK  MRN: [id number]  DOB: December 25, 1980  GA: [redacted]w[redacted]d            Non-Stress Test 1  Reason for Non-Stress Test 1 : AMA, Multiple Gestation  Variability in Waveform for Non-Stress Test 1: Moderate  Decelerations in Non-Stress Test 1: None  Accelerations in Non-Stress Test 1: Yes  Acoustic Stimulator for Non-Stress Test 1: No  Baseline Fetal Heart Rate for Non-Stress Test 1: 140 BPM  Uterine Irritability for Non-Stress Test 1: No  Contractions in Non-Stress Test 1: Not present    Interpretation of Non-Stress Test 1  Interpretation of Non-Stress Test 1: Reactive         Non-Stress Test 2  Variability in Waveform for Non-Stress Test 2: Moderate  Decelerations in Non-Stress Test 2: None  Accelerations in Non-Stress Test 2: Yes  Acoustic Stimulator for Non-Stress Test 2: No  Baseline Fetal Heart Rate for Non-Stress Test 2: 145 BPM  Uterine Irritability for Non-Stress Test 2: No  Contractions in Non-Stress Test 2: Not present    Interpretation of Non-Stress Test 2  Interpretation of Non-Stress Test 2: Reactive  Comments on Non-Stress Test 2: Twins Baby #2            BP 101/71   Pulse 101   Ht 1.549 m   Wt 79.8 kg   BMI 33.27 kg/m           Next two NSTs booked for 8/1 and 8/8 at 11:00 AM. Discharge instructions reviewed and patient verbalizes understanding. No concerns present at this time. D/c to home/self care.            Bager, Herbert Seta, RN

## 2021-04-07 NOTE — Discharge Instructions (Signed)
 The Labor & Delivery Unit can be reached at [phone].    Call your physician or return to the hospital if you:    Think you may be in labor  Have regular contractions that get progressively closer, longer, and stronger.  If you are less than 37 weeks, call if you have more than 4 contractions in one hour or if you have menstrual-like cramps, a dull backache or pressure in your pelvis, back or thighs.  Your water breaks or you are leaking fluid  Note time and color  Notice an absence or marked decrease in fetal movement  Have bleeding or spotting  Have sudden, sharp abdominal pain  Have any severe headaches, dizziness, blurred vision or spots before your eyes  Have excessive swelling of your hands, face, or feet  Have chest pain/indigestion    Drink 10-12, 8 oz. Glasses of fluids (preferably water) per day.    Rest on your right or left side.

## 2021-04-08 ENCOUNTER — Encounter (HOSPITAL_BASED_OUTPATIENT_CLINIC_OR_DEPARTMENT_OTHER)

## 2021-04-08 ENCOUNTER — Other Ambulatory Visit (HOSPITAL_BASED_OUTPATIENT_CLINIC_OR_DEPARTMENT_OTHER)

## 2021-04-08 ENCOUNTER — Encounter

## 2021-04-08 ENCOUNTER — Encounter (INDEPENDENT_AMBULATORY_CARE_PROVIDER_SITE_OTHER): Admitting: Specialist

## 2021-04-08 DIAGNOSIS — O30033 Twin pregnancy, monochorionic/diamniotic, third trimester: Secondary | ICD-10-CM

## 2021-04-09 ENCOUNTER — Encounter (INDEPENDENT_AMBULATORY_CARE_PROVIDER_SITE_OTHER)

## 2021-04-09 ENCOUNTER — Other Ambulatory Visit

## 2021-04-09 ENCOUNTER — Other Ambulatory Visit (INDEPENDENT_AMBULATORY_CARE_PROVIDER_SITE_OTHER)

## 2021-04-09 ENCOUNTER — Encounter

## 2021-04-10 ENCOUNTER — Ambulatory Visit: Attending: Adult Health

## 2021-04-10 ENCOUNTER — Other Ambulatory Visit

## 2021-04-10 DIAGNOSIS — O24410 Gestational diabetes mellitus in pregnancy, diet controlled: Secondary | ICD-10-CM

## 2021-04-10 NOTE — Progress Notes (Signed)
 Diabetes Education ([facility]) Clinic Note    Paige [name] is a delightful 40 y.o. G4P2 new EDD inducement 8/25 for her twins. Telehealth. She continues to have all target BS data and feels well. She will be working from home for the last 2 weeks and has time off afterwards.    PCP:  Referring Physician: Frances Furbish, MD      Past Medical History:  Patient Active Problem List   Diagnosis   . Encounter for gynecological examination (general) (routine) without abnormal findings   . Eczema   . Allergy, unspecified, initial encounter   . Encounter for screening for malignant neoplasm of cervix   . Hyperlipidemia   . Twin pregnancy, unspecified number of placenta and unspecified number of amniotic sacs, unspecified trimester   . Vitamin D deficiency   . Personal history of gestational diabetes   . Supervision of high risk pregnancy in second trimester   . [redacted] weeks gestation of pregnancy   . Supervision of elderly multigravida, third trimester   . Vesicular skin lesions   . Deviated nasal septum   . Post-nasal drip   . Obesity, unspecified   . Melanocytic nevi, unspecified   . Polycystic ovarian syndrome        Medications:    Current Outpatient Medications:   .  aspirin 81 mg capsule, , Disp: , Rfl:   .  blood sugar diagnostic (OneTouch Verio test strips) strip, Wendall Mola, Disp: 200 strip, Rfl: 3  .  blood sugar diagnostic (OneTouch Verio test strips) strip, Wendall Mola, Disp: 200 strip, Rfl: 3  .  blood-glucose meter (OneTouch Verio Flex meter) misc, Wendall Mola, Disp: 1 each, Rfl: 0  .  CALCIUM CARBONATE ORAL, Take 1 tablet by mouth in the morning., Disp: , Rfl:   .  cetirizine (ZyrTEC) 10 mg tablet, Take 10 mg by mouth in the morning. 1 by mouth daily for allergies, Disp: , Rfl:   .  cholecalciferol, vitamin D3, 50 mcg (2,000 unit) tablet,chewable, Chew 2,000 Units in the morning., Disp: , Rfl:   .  lancets (OneTouch Delica Lancets) 30 gauge misc, TEST BLOOD SUGAR FOUR TIMES DAILY, Disp: 400 Lancet,  Rfl: 3  .  lancets 33 gauge misc, Wendall Mola, Disp: 200 Lancet, Rfl: 3  .  mupirocin (Bactroban) 2 % ointment, in the morning, at noon, and at bedtime., Disp: , Rfl:   .  PRENATAL 105-IRON-FOLIC AC-DHA ORAL, Take 1 tablet by mouth in the morning., Disp: , Rfl:     Allergies:  No Known Allergies    Family History:  Family History   Problem Relation Name Age of Onset   . Atrial fibrillation Mother Mother 68   . Hyperlipidemia Mother Mother    . Arthritis Mother Mother    . Hyperlipidemia Father Father    . Nephrolithiasis Father Father    . Vision loss Father Father    . No Known Problems Sister     . Other (stomach issues) Sister     . Hyperlipidemia Brother     . Heart disease Neg Hx          No known family congenital heart disease or cardiac history.       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     Review of Data:  No results found for: BMPR1A, HGBA1C, HGBA1C, MICROALBCREA, MICROALBCREA, LIPIDPTFAST     Diabetes Education Addressed:      Diabetes  Self Management:  MONITORING: Uses OT connect with weekly checks. Her BS data is all at target levels.              MEDICATIONS:  Lifestyle    EATING:  Lower carb eating and being mindful of amounts    ACTIVITY/EXERCISE:  Moving as tolerated    COPING:   Acceptance      Recommendations and Care Plan:   1. Has post partum follow up appt  2. Continue to test 4x/day for at least 2 more weeks  3. IF data is all at target levels please decrease to fastings and maintain a lower carb lifestyle  4. PT aware of weekly checks        Dion Body BSN, RN, [employer]   Certified Diabetes Care and Education Specialist

## 2021-04-13 ENCOUNTER — Other Ambulatory Visit

## 2021-04-15 ENCOUNTER — Encounter (HOSPITAL_BASED_OUTPATIENT_CLINIC_OR_DEPARTMENT_OTHER): Admitting: Obstetrics & Gynecology

## 2021-04-15 ENCOUNTER — Ambulatory Visit (HOSPITAL_BASED_OUTPATIENT_CLINIC_OR_DEPARTMENT_OTHER)

## 2021-04-15 ENCOUNTER — Other Ambulatory Visit

## 2021-04-15 ENCOUNTER — Ambulatory Visit (HOSPITAL_BASED_OUTPATIENT_CLINIC_OR_DEPARTMENT_OTHER)
Admission: RE | Admit: 2021-04-15 | Discharge: 2021-04-15 | Disposition: A | Discharge status: Home / Self Care | Source: Ambulatory Visit

## 2021-04-15 ENCOUNTER — Ambulatory Visit
Admission: RE | Admit: 2021-04-15 | Discharge: 2021-04-15 | Disposition: A | Discharge status: Home / Self Care | Source: Ambulatory Visit | Attending: Obstetrics & Gynecology | Admitting: Obstetrics & Gynecology

## 2021-04-15 NOTE — Procedures (Signed)
 OB NST Note    Name: Paige [name]  MRN: [id number]  DOB: [birth year]  GA: [redacted]w[redacted]d            Non-Stress Test 1  Reason for Non-Stress Test 1 : Other (Comment) (twins)  Variability in Waveform for Non-Stress Test 1: Moderate  Decelerations in Non-Stress Test 1: None  Accelerations in Non-Stress Test 1: Yes  Baseline Fetal Heart Rate for Non-Stress Test 1: 130 BPM  Uterine Irritability for Non-Stress Test 1: No  Contractions in Non-Stress Test 1: Regular    Interpretation of Non-Stress Test 1  Interpretation of Non-Stress Test 1: Reactive  Multiple NSTs: Yes    Biophysical Profile  Fetal Breathing: 2  Fetal Movement: 2  Fetal Tone: 2  Fluid Volume: 2  Nonstress Test: 2  AFI: 16  Biophysical Profile Score (of 8): 8 /8  Biophysical Profile Score (of 10): 10 /10    Non-Stress Test 2  Reason for Non-Stress Test 2: Other (Comment) (twin pregnancy BABY B)  Variability in Waveform for Non-Stress Test 2: Moderate  Decelerations in Non-Stress Test 2: None  Accelerations in Non-Stress Test 2:  (accels seen but fetus active so BPP ordered for both)  Baseline Fetal Heart Rate for Non-Stress Test 2: 135 BPM  Uterine Irritability for Non-Stress Test 2: No  Contractions in Non-Stress Test 2: Not present    Interpretation of Non-Stress Test 2  Interpretation of Non-Stress Test 2: (!) Non-reactive (BPP Fetus B 8/8- AFI 16)  Comments on Non-Stress Test 2: Dr Dorna Bloom notified of reactive nst for BABY A with nonreactive due to activity of BABY B; BPP's and AFI reviewed per md pt ok for dc                      Visit Vitals  BP 106/70   Pulse 88   Resp 20                 Hollace Hayward, RN

## 2021-04-15 NOTE — Discharge Instructions (Signed)
 The Labor & Delivery Unit can be reached at [phone].    Call your physician or return to the hospital if you:    Think you may be in labor  Have regular contractions that get progressively closer, longer, and stronger.  If you are less than 37 weeks, call if you have more than 4 contractions in one hour or if you have menstrual-like cramps, a dull backache or pressure in your pelvis, back or thighs.  Your water breaks or you are leaking fluid  Note time and color  Notice an absence or marked decrease in fetal movement  Have bleeding or spotting  Have sudden, sharp abdominal pain  Have any severe headaches, dizziness, blurred vision or spots before your eyes  Have excessive swelling of your hands, face, or feet  Have chest pain/indigestion    Drink 10-12, 8 oz. Glasses of fluids (preferably water) per day.    Rest on your right or left side.

## 2021-04-16 ENCOUNTER — Ambulatory Visit (INDEPENDENT_AMBULATORY_CARE_PROVIDER_SITE_OTHER): Admitting: Obstetrics & Gynecology

## 2021-04-17 ENCOUNTER — Encounter (INDEPENDENT_AMBULATORY_CARE_PROVIDER_SITE_OTHER)

## 2021-04-17 ENCOUNTER — Ambulatory Visit (INDEPENDENT_AMBULATORY_CARE_PROVIDER_SITE_OTHER): Admitting: Obstetrics & Gynecology

## 2021-04-17 ENCOUNTER — Encounter

## 2021-04-17 ENCOUNTER — Other Ambulatory Visit

## 2021-04-17 DIAGNOSIS — Z331 Pregnant state, incidental: Secondary | ICD-10-CM

## 2021-04-17 LAB — POCT URINALYSIS DIPSTICK
Glucose, UA, POC: NEGATIVE
Leukocytes, UA, POC: NEGATIVE
Nitrite, UA, POC: NEGATIVE
Protein, UA, POC: NEGATIVE

## 2021-04-17 NOTE — Progress Notes (Signed)
 Review of BS data via OT Connect. She is doing great! All levels are at target goals!

## 2021-04-17 NOTE — Progress Notes (Signed)
 She has no c/o  [xxx] 36 wk-05/06/21 @ 10:45am 50 [facility]   [xxx] 36 wk-05/06/21 @ 10:45am 50 [facility]

## 2021-04-17 NOTE — Progress Notes (Signed)
 Visit Vitals  BP 107/71   Ht 1.549 m   Wt 80.7 kg   BMI 33.65 kg/m   OB Status Pregnant   BSA 1.86 m

## 2021-04-18 ENCOUNTER — Encounter (INDEPENDENT_AMBULATORY_CARE_PROVIDER_SITE_OTHER): Admitting: Specialist

## 2021-04-18 ENCOUNTER — Other Ambulatory Visit (HOSPITAL_BASED_OUTPATIENT_CLINIC_OR_DEPARTMENT_OTHER)

## 2021-04-18 ENCOUNTER — Encounter

## 2021-04-18 ENCOUNTER — Encounter (INDEPENDENT_AMBULATORY_CARE_PROVIDER_SITE_OTHER): Admitting: Obstetrics & Gynecology

## 2021-04-18 DIAGNOSIS — O30033 Twin pregnancy, monochorionic/diamniotic, third trimester: Secondary | ICD-10-CM

## 2021-04-21 ENCOUNTER — Other Ambulatory Visit

## 2021-04-22 ENCOUNTER — Ambulatory Visit
Admission: RE | Admit: 2021-04-22 | Discharge: 2021-04-22 | Disposition: A | Discharge status: Home / Self Care | Source: Ambulatory Visit | Attending: Obstetrics & Gynecology | Admitting: Obstetrics & Gynecology

## 2021-04-22 ENCOUNTER — Encounter (INDEPENDENT_AMBULATORY_CARE_PROVIDER_SITE_OTHER)

## 2021-04-22 ENCOUNTER — Ambulatory Visit

## 2021-04-22 ENCOUNTER — Encounter (HOSPITAL_BASED_OUTPATIENT_CLINIC_OR_DEPARTMENT_OTHER)

## 2021-04-22 NOTE — Discharge Instructions (Signed)
 The Labor & Delivery Unit can be reached at [phone].    Call your physician or return to the hospital if you:    Think you may be in labor  Have regular contractions that get progressively closer, longer, and stronger.  If you are less than 37 weeks, call if you have more than 4 contractions in one hour or if you have menstrual-like cramps, a dull backache or pressure in your pelvis, back or thighs.  Your water breaks or you are leaking fluid  Note time and color  Notice an absence or marked decrease in fetal movement  Have bleeding or spotting  Have sudden, sharp abdominal pain  Have any severe headaches, dizziness, blurred vision or spots before your eyes  Have excessive swelling of your hands, face, or feet  Have chest pain/indigestion    Drink 10-12, 8 oz. Glasses of fluids (preferably water) per day.    Rest on your right or left side.

## 2021-04-22 NOTE — Procedures (Signed)
 OB NST Note    Name: Paige [name]  MRN: [id number]  DOB: 1980/10/25  GA: [redacted]w[redacted]d            Non-Stress Test 1  Reason for Non-Stress Test 1 : Diabetes, Multiple Gestation  Variability in Waveform for Non-Stress Test 1: Moderate  Decelerations in Non-Stress Test 1: None  Accelerations in Non-Stress Test 1: Yes  Baseline Fetal Heart Rate for Non-Stress Test 1: 145 BPM  Uterine Irritability for Non-Stress Test 1: No  Contractions in Non-Stress Test 1: Not present    Interpretation of Non-Stress Test 1  Interpretation of Non-Stress Test 1: Reactive         Non-Stress Test 2  Reason for Non-Stress Test 2: Diabetes, Other (Comment) (TWIN GESTATION)  Variability in Waveform for Non-Stress Test 2: Moderate  Decelerations in Non-Stress Test 2: None  Accelerations in Non-Stress Test 2: Yes  Baseline Fetal Heart Rate for Non-Stress Test 2: 135 BPM  Uterine Irritability for Non-Stress Test 2: No  Contractions in Non-Stress Test 2: Not present    Interpretation of Non-Stress Test 2  Interpretation of Non-Stress Test 2: Reactive  Comments on Non-Stress Test 2: TWIN B VERY ACTIVE, AUDIBLE MOVEMENT       Visit Vitals  BP 117/71   Pulse 93   Resp 20                                Wilhelmina Mcardle, RN

## 2021-04-24 ENCOUNTER — Other Ambulatory Visit

## 2021-04-24 ENCOUNTER — Other Ambulatory Visit (HOSPITAL_BASED_OUTPATIENT_CLINIC_OR_DEPARTMENT_OTHER)

## 2021-04-24 ENCOUNTER — Encounter (HOSPITAL_BASED_OUTPATIENT_CLINIC_OR_DEPARTMENT_OTHER)

## 2021-04-24 ENCOUNTER — Encounter (INDEPENDENT_AMBULATORY_CARE_PROVIDER_SITE_OTHER)

## 2021-04-24 NOTE — Progress Notes (Signed)
 Review of BS data via OT Connect shows all at target levels! She's doing great! EDD 8/25

## 2021-04-25 ENCOUNTER — Other Ambulatory Visit

## 2021-04-25 ENCOUNTER — Other Ambulatory Visit (HOSPITAL_BASED_OUTPATIENT_CLINIC_OR_DEPARTMENT_OTHER)

## 2021-04-25 ENCOUNTER — Encounter

## 2021-04-25 ENCOUNTER — Encounter (INDEPENDENT_AMBULATORY_CARE_PROVIDER_SITE_OTHER): Admitting: Specialist

## 2021-04-25 DIAGNOSIS — O30033 Twin pregnancy, monochorionic/diamniotic, third trimester: Secondary | ICD-10-CM

## 2021-04-28 ENCOUNTER — Other Ambulatory Visit

## 2021-04-29 ENCOUNTER — Ambulatory Visit
Admission: RE | Admit: 2021-04-29 | Discharge: 2021-04-29 | Disposition: A | Discharge status: Home / Self Care | Source: Ambulatory Visit | Attending: Obstetrics & Gynecology | Admitting: Obstetrics & Gynecology

## 2021-04-29 ENCOUNTER — Encounter (HOSPITAL_BASED_OUTPATIENT_CLINIC_OR_DEPARTMENT_OTHER): Admitting: Obstetrics & Gynecology

## 2021-04-29 ENCOUNTER — Ambulatory Visit (HOSPITAL_BASED_OUTPATIENT_CLINIC_OR_DEPARTMENT_OTHER)

## 2021-04-29 ENCOUNTER — Other Ambulatory Visit

## 2021-04-29 NOTE — Procedures (Signed)
 OB NST Note    Name: Paige [name]  MRN: [id number]  DOB: 1980/10/22  GA: [redacted]w[redacted]d            Non-Stress Test 1  Reason for Non-Stress Test 1 : AMA, Diabetes, Multiple Gestation  Variability in Waveform for Non-Stress Test 1: Moderate  Decelerations in Non-Stress Test 1: None  Accelerations in Non-Stress Test 1: Yes  Baseline Fetal Heart Rate for Non-Stress Test 1: 145 BPM  Uterine Irritability for Non-Stress Test 1: No  Contractions in Non-Stress Test 1: Not present    Interpretation of Non-Stress Test 1  Interpretation of Non-Stress Test 1: Reactive  Comments on Non-Stress Test 1: baby A         Non-Stress Test 2  Reason for Non-Stress Test 2: Diabetes, Other (Comment) (AMA, TWIN Gestation)  Variability in Waveform for Non-Stress Test 2: Moderate  Decelerations in Non-Stress Test 2: None  Accelerations in Non-Stress Test 2: Yes  Acoustic Stimulator for Non-Stress Test 2: No  Baseline Fetal Heart Rate for Non-Stress Test 2: 135 BPM  Uterine Irritability for Non-Stress Test 2: No  Contractions in Non-Stress Test 2: Not present    Interpretation of Non-Stress Test 2  Interpretation of Non-Stress Test 2: Reactive  Comments on Non-Stress Test 2: baby B         Visit Vitals  BP 111/78   Pulse 97   Resp 20                 Bager, Heather, RN

## 2021-04-29 NOTE — Discharge Instructions (Signed)
 The Labor & Delivery Unit can be reached at [phone].    Call your physician or return to the hospital if you:    Think you may be in labor  Have regular contractions that get progressively closer, longer, and stronger.  If you are less than 37 weeks, call if you have more than 4 contractions in one hour or if you have menstrual-like cramps, a dull backache or pressure in your pelvis, back or thighs.  Your water breaks or you are leaking fluid  Note time and color  Notice an absence or marked decrease in fetal movement  Have bleeding or spotting  Have sudden, sharp abdominal pain  Have any severe headaches, dizziness, blurred vision or spots before your eyes  Have excessive swelling of your hands, face, or feet  Have chest pain/indigestion    Drink 10-12, 8 oz. Glasses of fluids (preferably water) per day.    Rest on your right or left side.

## 2021-04-30 ENCOUNTER — Encounter (INDEPENDENT_AMBULATORY_CARE_PROVIDER_SITE_OTHER)

## 2021-05-01 ENCOUNTER — Encounter (INDEPENDENT_AMBULATORY_CARE_PROVIDER_SITE_OTHER)

## 2021-05-01 ENCOUNTER — Ambulatory Visit (INDEPENDENT_AMBULATORY_CARE_PROVIDER_SITE_OTHER): Admitting: Obstetrics & Gynecology

## 2021-05-01 ENCOUNTER — Ambulatory Visit: Attending: Obstetrics & Gynecology | Admitting: Obstetrics & Gynecology

## 2021-05-01 ENCOUNTER — Other Ambulatory Visit

## 2021-05-01 ENCOUNTER — Encounter

## 2021-05-01 DIAGNOSIS — Z3A35 35 weeks gestation of pregnancy: Secondary | ICD-10-CM

## 2021-05-01 LAB — POCT URINALYSIS DIPSTICK
Glucose, UA, POC: NEGATIVE
Leukocytes, UA, POC: NEGATIVE
Nitrite, UA, POC: NEGATIVE
Protein, UA, POC: NEGATIVE

## 2021-05-01 NOTE — Progress Notes (Signed)
-  CRICO GVN TO PT  She has no complain  Will bring crico to Labor Delivery Unit   GBBS done, reviewed [facility], s/s of labor and when and where she should report when she is in labor  CRICO/[facility] cosent/[facility] blood transfusion consent gave pt to review and bring back to office after signed    IOL on Monday  She will be induced   I explained the methods of induction.  (1)cervidil which is small medication in the end of string which will be placed by [place] behind her cervix. This cervidil will be left in your vagina for 12 hours.  We will removed it after 12 hours.  Occ it will be removed due to hyperstimulation(too many contractions and too frequent) or your baby does not tolerate this medication.  (2)pitocin will be start one hour after the cervidil removed    Risks of Induction of labor  1. hyperstimulation  2. the need of using Terbutaline to decrease the frequency of uterine contraction and the side effects of Terbutaline such as increaseing maternal heart rate  3. fetal deceleration and the need fo emergency cesarean section and the need of future cesarean section if she has one.  4. ruptured uterus  These risks are very small but it is not zero.    She is scheduled IOL on             @  She is advised to call Labor and Delivery 2 hours before her scheduled induction time in labor and delivery to be sure it is still ok to keep her induction schedule.  She is advised to eat a full meal 45 minutes before her scheduled time in labor and delivery.

## 2021-05-02 LAB — VAG/RECTAL GROUP B BY PCR: Group B Strep DNA: NOT DETECTED

## 2021-05-03 ENCOUNTER — Ambulatory Visit (HOSPITAL_BASED_OUTPATIENT_CLINIC_OR_DEPARTMENT_OTHER)
Admission: RE | Admit: 2021-05-03 | Discharge: 2021-05-03 | Disposition: A | Discharge status: Home / Self Care | Source: Ambulatory Visit | Attending: Obstetrics & Gynecology | Admitting: Obstetrics & Gynecology

## 2021-05-03 ENCOUNTER — Encounter (HOSPITAL_BASED_OUTPATIENT_CLINIC_OR_DEPARTMENT_OTHER): Admitting: Obstetrics & Gynecology

## 2021-05-03 ENCOUNTER — Ambulatory Visit (HOSPITAL_BASED_OUTPATIENT_CLINIC_OR_DEPARTMENT_OTHER)

## 2021-05-03 MED ORDER — betamethasone acet,sod phos (Celestone) injection 6 mg
6 | Freq: Once | INTRAMUSCULAR | Status: AC
Start: 2021-05-03 — End: 2021-05-03
  Administered 2021-05-03: 18:00:00 6 mg via INTRAMUSCULAR

## 2021-05-03 MED FILL — BETAMETHASONE ACETATE AND SODIUM PHOS 6 MG/ML SUSPENSION FOR INJECTION: 6 6 mg/mL | INTRAMUSCULAR | Qty: 5

## 2021-05-03 NOTE — Discharge Instructions (Signed)
 The Labor & Delivery Unit can be reached at [phone].    Call your physician or return to the hospital if you:    Think you may be in labor  Have regular contractions that get progressively closer, longer, and stronger.  If you are less than 37 weeks, call if you have more than 4 contractions in one hour or if you have menstrual-like cramps, a dull backache or pressure in your pelvis, back or thighs.  Your water breaks or you are leaking fluid  Note time and color  Notice an absence or marked decrease in fetal movement  Have bleeding or spotting  Have sudden, sharp abdominal pain  Have any severe headaches, dizziness, blurred vision or spots before your eyes  Have excessive swelling of your hands, face, or feet  Have chest pain/indigestion    Drink 10-12, 8 oz. Glasses of fluids (preferably water) per day.    Rest on your right or left side.

## 2021-05-03 NOTE — Procedures (Signed)
 OB NST Note    Name: Paige [name]  MRN: [id number]  DOB: December 27, 1980  GA: [redacted]w[redacted]d            Non-Stress Test 1  Reason for Non-Stress Test 1 : AMA, Multiple Gestation, Other (Comment) (TWINS/Beta Injection #1)  Variability in Waveform for Non-Stress Test 1: Moderate  Decelerations in Non-Stress Test 1: None  Accelerations in Non-Stress Test 1: Yes  Acoustic Stimulator for Non-Stress Test 1: No  Baseline Fetal Heart Rate for Non-Stress Test 1: 135 BPM  Uterine Irritability for Non-Stress Test 1: No  Contractions in Non-Stress Test 1: Not present    Interpretation of Non-Stress Test 1  Interpretation of Non-Stress Test 1: Reactive (Baby A)  Multiple NSTs: Yes         Non-Stress Test 2  Reason for Non-Stress Test 2: Other (Comment) (TWINS, AMA BETA #1)  Variability in Waveform for Non-Stress Test 2: Moderate  Decelerations in Non-Stress Test 2: None  Accelerations in Non-Stress Test 2: Yes  Acoustic Stimulator for Non-Stress Test 2: No  Baseline Fetal Heart Rate for Non-Stress Test 2: 140 BPM  Uterine Irritability for Non-Stress Test 2: No  Contractions in Non-Stress Test 2: Not present    Interpretation of Non-Stress Test 2  Interpretation of Non-Stress Test 2: Reactive (Baby B)      BP 115/78   Pulse 91   Resp 18      Beta #1 IM injection given in L ventrogluteal. Patient tolerated well. Injection site intact. D/c instructions reviewed and understood by pt. Will be returning tomorrow for Beta #2 injection and 8/22 for IOL. No concerns present at this time.                                Bager, Herbert Seta, RN

## 2021-05-04 ENCOUNTER — Ambulatory Visit (HOSPITAL_BASED_OUTPATIENT_CLINIC_OR_DEPARTMENT_OTHER)

## 2021-05-04 ENCOUNTER — Ambulatory Visit (HOSPITAL_BASED_OUTPATIENT_CLINIC_OR_DEPARTMENT_OTHER)
Admission: RE | Admit: 2021-05-04 | Discharge: 2021-05-04 | Disposition: A | Discharge status: Home / Self Care | Source: Ambulatory Visit | Attending: Obstetrics & Gynecology | Admitting: Obstetrics & Gynecology

## 2021-05-04 ENCOUNTER — Encounter

## 2021-05-04 MED ORDER — betamethasone acet,sod phos (Celestone) injection 12 mg
6 | Freq: Once | INTRAMUSCULAR | Status: AC
Start: 2021-05-04 — End: 2021-05-04
  Administered 2021-05-04: 17:00:00 12 mg via INTRAMUSCULAR

## 2021-05-05 ENCOUNTER — Encounter (HOSPITAL_BASED_OUTPATIENT_CLINIC_OR_DEPARTMENT_OTHER)

## 2021-05-06 ENCOUNTER — Other Ambulatory Visit (HOSPITAL_BASED_OUTPATIENT_CLINIC_OR_DEPARTMENT_OTHER)

## 2021-05-06 ENCOUNTER — Other Ambulatory Visit

## 2021-05-06 ENCOUNTER — Inpatient Hospital Stay
Admission: RE | Admit: 2021-05-06 | Discharge: 2021-05-09 | DRG: 560 | Disposition: A | Discharge status: Home / Self Care | Source: Ambulatory Visit | Attending: Obstetrics & Gynecology | Admitting: Obstetrics & Gynecology

## 2021-05-06 ENCOUNTER — Telehealth (INDEPENDENT_AMBULATORY_CARE_PROVIDER_SITE_OTHER)

## 2021-05-06 ENCOUNTER — Inpatient Hospital Stay (HOSPITAL_BASED_OUTPATIENT_CLINIC_OR_DEPARTMENT_OTHER): Admitting: Anesthesiology

## 2021-05-06 ENCOUNTER — Encounter (HOSPITAL_BASED_OUTPATIENT_CLINIC_OR_DEPARTMENT_OTHER): Admitting: Obstetrics & Gynecology

## 2021-05-06 ENCOUNTER — Inpatient Hospital Stay (HOSPITAL_BASED_OUTPATIENT_CLINIC_OR_DEPARTMENT_OTHER)

## 2021-05-06 DIAGNOSIS — O30033 Twin pregnancy, monochorionic/diamniotic, third trimester: Principal | ICD-10-CM

## 2021-05-06 LAB — SARS/FLU/RSV
Influenza A RNA: NOT DETECTED
Influenza B RNA: NOT DETECTED
Respiratory syncytial virus: NOT DETECTED
SARS-CoV-2 RNA PCR: NOT DETECTED

## 2021-05-06 LAB — POCT GLUCOSE
POCT Glucose: 85 mg/dL (ref 70–110)
POCT Glucose: 86 mg/dL (ref 70–110)
POCT Glucose: 76 mg/dL (ref 70–110)
POCT Glucose: 76 mg/dL (ref 70–110)
POCT Glucose: 71 mg/dL (ref 70–110)
POCT Glucose: 77 mg/dL (ref 70–110)
POCT Glucose: 77 mg/dL (ref 70–110)
POCT Glucose: 77 mg/dL (ref 70–110)
POCT Glucose: 75 mg/dL (ref 70–110)
POCT Glucose: 70 mg/dL (ref 70–110)
POCT Glucose: 75 mg/dL (ref 70–110)
POCT Glucose: 72 mg/dL (ref 70–110)
POCT Glucose: 82 mg/dL (ref 70–110)
POCT Glucose: 78 mg/dL (ref 70–110)
POCT Glucose: 80 mg/dL (ref 70–110)

## 2021-05-06 LAB — CBC WITH DIFFERENTIAL
Basophils %: 0.3 %
Basophils Absolute: 0.03 10*3/uL (ref 0.00–0.22)
Eosinophils %: 1.1 %
Eosinophils Absolute: 0.1 10*3/uL (ref 0.00–0.50)
Hematocrit: 35.2 % (ref 32.0–47.0)
Hemoglobin: 10.8 g/dL — ABNORMAL LOW (ref 11.0–16.0)
Immature Granulocytes %: 1.5 %
Immature Granulocytes Absolute: 0.13 10*3/uL — ABNORMAL HIGH (ref 0.00–0.10)
Lymphocyte %: 26.7 %
Lymphocytes Absolute: 2.34 10*3/uL (ref 0.70–4.00)
MCH: 27.7 pg (ref 26.0–34.0)
MCHC: 30.7 g/dL — ABNORMAL LOW (ref 31.0–37.0)
MCV: 90.3 fL (ref 80.0–100.0)
MPV: 11.6 fL (ref 9.1–12.4)
Monocytes %: 7.6 %
Monocytes Absolute: 0.67 10*3/uL (ref 0.36–0.77)
NRBC %: 0.6 % — ABNORMAL HIGH (ref 0.0–0.0)
NRBC Absolute: 0.05 10*3/uL (ref 0.00–2.00)
Neutrophil %: 62.8 %
Neutrophils Absolute: 5.5 10*3/uL (ref 1.50–7.95)
Platelets: 155 10*3/uL (ref 150–400)
RBC: 3.9 M/uL (ref 3.70–5.20)
RDW-CV: 14.6 % — ABNORMAL HIGH (ref 11.5–14.5)
RDW-SD: 46.8 fL (ref 35.0–51.0)
WBC: 8.8 10*3/uL (ref 4.0–11.0)

## 2021-05-06 LAB — TYPE AND SCREEN
ABORh: A POS
Antibody Screen: NEGATIVE

## 2021-05-06 LAB — GLUCOSE: Glucose: 88 mg/dL (ref 70–139)

## 2021-05-06 MED ORDER — dextrose 5 % and lactated Ringer's infusion
INTRAVENOUS | Status: DC
Start: 2021-05-06 — End: 2021-05-09
  Administered 2021-05-06: 16:00:00 75 mL/h via INTRAVENOUS

## 2021-05-06 MED ORDER — sodium citrate-citric acid (Bicitra) solution 30 mL
500-334 | Freq: Once | ORAL | Status: DC
Start: 2021-05-06 — End: 2021-05-07

## 2021-05-06 MED ORDER — prenatal multivitamin (Prenatal) 1 each
Freq: Every day | ORAL | Status: DC
Start: 2021-05-06 — End: 2021-05-07

## 2021-05-06 MED ORDER — naloxone (Narcan) injection 0.1 mg
0.4 | INTRAMUSCULAR | Status: DC | PRN
Start: 2021-05-06 — End: 2021-05-07

## 2021-05-06 MED ORDER — ondansetron (Zofran) injection 4 mg
4 | Freq: Four times a day (QID) | INTRAMUSCULAR | Status: DC | PRN
Start: 2021-05-06 — End: 2021-05-07
  Administered 2021-05-07: 06:00:00 4 mg via INTRAVENOUS

## 2021-05-06 MED ORDER — sodium chloride 0.9 % flush 1 mL
Freq: Three times a day (TID) | INTRAMUSCULAR | Status: DC | PRN
Start: 2021-05-06 — End: 2021-05-07

## 2021-05-06 MED ORDER — ropivacaine (Naropin) 0.2 % PCEA
0.2 | EPIDURAL | Status: DC
Start: 2021-05-06 — End: 2021-05-09
  Administered 2021-05-06: 23:00:00 8 via EPIDURAL

## 2021-05-06 MED ORDER — lidocaine-epinephrine (PF) (Xylocaine W/EPI) 1.5 %-1:200,000 injection
1.5 | Freq: Once | INTRAMUSCULAR | Status: AC | PRN
Start: 2021-05-06 — End: 2021-05-06
  Administered 2021-05-06: 23:00:00 3 via EPIDURAL
  Administered 2021-05-06: 23:00:00 2 via EPIDURAL

## 2021-05-06 MED ORDER — acetaminophen (Tylenol) solution 650 mg
650 | Freq: Four times a day (QID) | ORAL | Status: DC | PRN
Start: 2021-05-06 — End: 2021-05-07

## 2021-05-06 MED ORDER — methylergonovine (Methergine) injection 200 mcg
0.2 | Freq: Once | INTRAMUSCULAR | Status: DC | PRN
Start: 2021-05-06 — End: 2021-05-07

## 2021-05-06 MED ORDER — HYDROmorphone (Dilaudid) tablet 4 mg
4 | Freq: Four times a day (QID) | ORAL | Status: DC | PRN
Start: 2021-05-06 — End: 2021-05-09
  Administered 2021-05-07: 06:00:00 4 mg via ORAL

## 2021-05-06 MED ORDER — nalbuphine (Nubain) injection 10 mg
20 | INTRAMUSCULAR | Status: DC | PRN
Start: 2021-05-06 — End: 2021-05-07

## 2021-05-06 MED ORDER — ondansetron ODT (Zofran-ODT) disintegrating tablet 4 mg
4 | Freq: Three times a day (TID) | ORAL | Status: DC | PRN
Start: 2021-05-06 — End: 2021-05-07

## 2021-05-06 MED ORDER — oxytocin (Pitocin) infusion in lactated Ringers 30 units/500 mL
30 | INTRAVENOUS | Status: DC
Start: 2021-05-06 — End: 2021-05-09
  Administered 2021-05-06: 14:00:00 1 m[IU]/min via INTRAVENOUS
  Administered 2021-05-07: 04:00:00 83.3 m[IU]/min via INTRAVENOUS

## 2021-05-06 MED ORDER — acetaminophen (Tylenol) suppository 650 mg
650 | Freq: Four times a day (QID) | RECTAL | Status: DC | PRN
Start: 2021-05-06 — End: 2021-05-07

## 2021-05-06 MED ORDER — acetaminophen (Tylenol) tablet 650 mg
325 | Freq: Four times a day (QID) | ORAL | Status: DC | PRN
Start: 2021-05-06 — End: 2021-05-07

## 2021-05-06 MED ORDER — miSOPROStoL (Cytotec) tablet 1,000 mcg
200 | Freq: Once | ORAL | Status: AC | PRN
Start: 2021-05-06 — End: 2021-05-06
  Administered 2021-05-07: 03:00:00 1000 ug via RECTAL

## 2021-05-06 MED ORDER — lactated Ringer's infusion
INTRAVENOUS | Status: DC
Start: 2021-05-06 — End: 2021-05-09
  Administered 2021-05-06: 14:00:00 75 mL/h via INTRAVENOUS

## 2021-05-06 MED ORDER — tranexamic acid injection 1,000 mg
1000 | Freq: Once | INTRAVENOUS | Status: DC | PRN
Start: 2021-05-06 — End: 2021-05-07

## 2021-05-06 MED ORDER — ondansetron (Zofran) injection 4 mg
4 | Freq: Three times a day (TID) | INTRAMUSCULAR | Status: DC | PRN
Start: 2021-05-06 — End: 2021-05-07

## 2021-05-06 MED ORDER — lactated Ringer's bolus 1,000 mL
Freq: Once | INTRAVENOUS | Status: DC
Start: 2021-05-06 — End: 2021-05-07

## 2021-05-06 MED FILL — OXYTOCIN IN LACTATED RINGERS 30 UNIT/500 ML INTRAVENOUS SOLUTION: 30 30 unit/500 mL | INTRAVENOUS | Qty: 500

## 2021-05-06 MED FILL — NALBUPHINE 20 MG/ML INJECTION SOLUTION: 20 20 mg/mL | INTRAMUSCULAR | Qty: 0.5

## 2021-05-06 NOTE — Anesthesia Pre-Procedure Evaluation (Signed)
 Patient: YAMNA MACKEL    Procedure Information    Date: 05/06/21  Procedure: Labor Analgesia         Relevant Problems   Anesthesia   (+) Personal history of gestational diabetes   (-) Family history of anesthetic complications   (-) History of anesthesia complications      Cardio   (+) Hyperlipidemia      Pulmonary (within normal limits)   (-) Asthma   (-) Recent URI      GI   (+) Gastroesophageal reflux disease      GU/Renal (within normal limits)      Endo   (+) Obesity, unspecified      Hematology (within normal limits)      Neuro/Psych (within normal limits)      Endocrine/Metabolic   (+) Polycystic ovarian syndrome      Other   (+) Twin gestation with ninth pregnancy       Clinical information reviewed:   Tobacco  Allergies  Meds   Med Hx  Surg Hx   Fam Hx           OB/Gyn Evaluation    Physical Exam    Airway  Mallampati: III  TM distance: >3 FB  Mouth opening: >3 FB  Neck ROM: full     Cardiovascular   Rhythm: regular  Rate: normal  Functional capacity: greater than or equal to 4 METS without symptoms   Dental - normal exam     Pulmonary - normal exam     Abdominal - normal exam     General   Alert                 Anesthesia Plan    ASA 2     epidural     (Risks explained, including PDPH. Platelets ok. All questions answered.)Airway: natural airway  Monitoring: standard monitors  Postoperative Pain Control: Epidural (consent obtained)    Essential imaging and labs available and reviewed    Anesthetic plan and risks discussed with patient.

## 2021-05-06 NOTE — H&P (Signed)
 Labor and Delivery H&P    Chief Complaint:    36 weeks 3 days GA  mon-di twin(2 girls)  AMA beta complete  IOL PER Dr. Karie Soda  Diet control GDM    Subjective   History of Present Illness:   Paige [name] is a 40 y.o. [phone] at [redacted]w[redacted]d with a working estimated date of delivery of 05/31/2021, Date entered prior to episode creation who presents with IOL  Twin gestation with ninth pregnancy    She denies  any contractions/LOF/VB and decreased FM. Otherwise feels well.  No fevers, chills, CP, SOB, abd pain, N/V, dysuria,abn discharge, diarrhea.    Her pregnancy is complicated by: Mon-Di twin    [name], 2014 NVSD Boy, NSVD 2017 boy) DIAMNIOTIC MONOCHORIONIC TWINS  [dd]sono for dates 10/15/20 - twins, small perigest bld - adv pelvic  [dd]f/u sono - perigest blds persisi, adv pelvic rest  [dd]EDC = 05/31/21 by LMP           [dd] covid vax-boosted 06/2020       [dd]NIPT- Low risk, 2 girls   [dd] GC for AMA - telehealth 11/19/20   [dd] NT Scan-w/ MFM check [facility]- 11/21/20 @ 50 rowe-NL NT both Mono-Di twins, no SCH seen, start baby ASA, Q  [dd]MSAFP only @ 16 wks neg 1.8 MoM          [dd] Flu-vax 06/2013  [dd] 16 wk -twin lmt anatomy and fluid check- WNL for both   [dd] 18 wks FS - 4/22 @ [facility] bos - limited views, f/u 01/14/21 @ 50 Rowe- suboptimal heart views for baby B only, f/u @ fetal echo (this will complete survey)  [dd] fetal echo- 01/21/21 @ 1pm pedi cardio @ [facility] bos - NL fetal echo, baby B heart views now complete   [xxx] GS every 2 weeks to check twins growth/fluid          [dd] 01/28/21 @  50 Rowe -fetus 1-breech, 571g, 1-4, 78%, fetus 2-transverse, 544g, 1-3, 66%          [dd] 24 wk-02/12/21 @ 10:45am @50  Rowe-fetus 1=VTX, fetus 2=transverse- NORMAL FLUID          [dd] 26 wk GS- fetus 1-breech, 1052g, 2-5, 61%, fetus 2-transverse-1031g, 2-4, 55%- fluid WNL          [dd] 28 wk-03/12/21 @ [facility]- Normal AFI for both twins          [dd] 30 wk sono w/ OB consult-03/27/21 @ 50 Rowe- fetus 1-VTX, 1741g, 3-13, 58%,  fetus 2- VTX, 1775g, 3-15, 64%, bpp=8/8 both          [dd] 32 wk-04/09/21 @ 50 Rowe- fluid normal for each twin, fetus 1- VTX, presenting, fetus-2=VTX, bpp=8/8 both          [dd] 34 wk BPP- Both fetuses are VTX, BPP= 8/8, normal AFI for both twins          [dd] 35 wk GS- fetus 1- VTX, 2512g, 5-9, 44%, fetus 2-VTX, 2564g, 5-10, 50%, fluid WNL for both- 04/25/2021          [xxx] 36 wk BPP-05/06/21 @ 10:45am 50 Rowe   [xxx]GDM -                  [dd] endocrin appt - 03/27/21, f/u 04/10/21           [dd] diabetic nurse appointment - 03/13/21 @ 8am Joslin video visit          [xxx] GS every 4 wks -           [  dd] MFM-   HGBEG- Neg                                  [xxx]Tdap  HSV 1&2- neg in past  Hep panels-neg 10/15/20   Rubella - imm 10/15/20  varicella- imm 10/15/20  Parvavirus -unknown   Toxo - Neg 10/15/20  Adv otc ca++  blood type - A+  [dd]CF,SMA,Fragile X- [dd] Counsyl drawn 10/31/15-NEGATIVE  [dd]Natera-274-declines   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  EPDS=0  Preferred Lab - N/A  [dd]Prog until 12 wks -stop 11/16/20  [dd]h/o GDM, on Metformin in 1st trimester - stopped 12/19/20  [dd]BV on GET 10/02/20, tx after 12 weeks  [dd]early glucola 4/12 - failed - passed 3 hour test on 12/26/2020  [Xxx] +covid 12/2020  [Xxx]Borderline Quantiferon test in 01/2021 and will repeat with primary care doctor @ [facility] in 02/2021  [Xxx]consider IOL around 36-37 weeks - 05/10/2021 will be 37 weeks  Non stress test 7/18  [Xxx]03/20/2021 advice get 2nd covid booster    Past Medical History:   Diagnosis Date   . Diabetes mellitus (CMS/HCC) 3017   . STD (sexually transmitted disease) 2005   . Tinea corporis 2006       Past Surgical History:   Procedure Laterality Date   . 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     Current Outpatient Medications on File Prior to Encounter   Medication Sig Dispense Refill   . aspirin 81 mg capsule      . blood sugar diagnostic (OneTouch Verio test strips) strip Wendall Mola 200 strip 3   . blood sugar diagnostic (OneTouch Verio test strips) strip [employer] 200 strip 3   . blood-glucose meter (OneTouch Verio Flex meter) misc Wendall Mola 1 each 0   . CALCIUM CARBONATE ORAL Take 1 tablet by mouth in the morning.     . cetirizine (ZyrTEC) 10 mg tablet Take 10 mg by mouth in the morning. 1 by mouth daily for allergies     . cholecalciferol, vitamin D3, 50 mcg (2,000 unit) tablet,chewable Chew 2,000 Units in the morning.     . lancets (OneTouch Delica Lancets) 30 gauge misc TEST BLOOD SUGAR FOUR TIMES DAILY 400 Lancet 3   . lancets 33 gauge misc Atavia Stallman 200 Lancet 3   . PRENATAL 105-IRON-FOLIC AC-DHA ORAL Take 1 tablet by mouth in the morning.     . [DISCONTINUED] mupirocin (Bactroban) 2 % ointment in the morning, at noon, and at bedtime.  No Known Allergies    Social Connections: Not on file       Family History   Problem Relation Name Age of Onset   . Atrial fibrillation Mother Mother 28   . Hyperlipidemia Mother Mother    . Arthritis Mother Mother    . Hyperlipidemia Father Father    . Nephrolithiasis Father Father    . Vision loss Father Father    . No Known Problems Sister     . Other (stomach issues) Sister     . Hyperlipidemia Brother     . Heart disease Neg Hx          No known family congenital heart disease or cardiac history.       Objective   Physical Exam  BP: 109/61, Pulse: 64, Temp: 36.6 C (97.9 F), RR: 20, Pulse Ox: 100 %  Gen: Alert, oriented  HEENT: normocephalic, atraumatic, sclera white, nonicteric, mask in place  CV:  normal S1S2 RRR  Lungs: CTAB  Abd: gravid, nontender to palpation  Ext: nontender, neg LE edema    SVE: 2/50%/-1 2  12:30pm  Fetal Heart Tones:  150 x 2, good variability, yes accelerations, no decels  Contractions:  q 0  min  Fetal Position by Bedside US: vtx x 2 by me    Prenatal labs:    Antepartum Ultrasound:        Assessment/Plan   This is a 40 y.o. [id number] at [redacted]w[redacted]d who is being admitted for IOL  Twin gestation with ninth pregnancy  Labor: Not in labor.,   FWB:  Cat 1 FHT=150 x 2  GBS: neg  Pain management: discussed  Labs: CBC, T&S, COVID swab,

## 2021-05-06 NOTE — Hospital Course (Addendum)
 05/06/2021   09:33am start pitocin with [place] confirmed vtx/vtx by me  12:30pm cx 2/50%/-1  17:17pm cx 3-4/50%/-1, AROM with pitocin @ 34mu/min clear fluid  Scalp clip applied on twin A(L) Paige [name]  19:58pm  cx 4/75%/-1, pitocin to 55mu/min Paige [name]  21:15pm cx 5/75%/0, pitocin @ 59mu/min soft, there was on variable Paige [name]  22:34pm cx complete/0, pitocin @ 59mu/min  22:50pm to OR

## 2021-05-06 NOTE — Progress Notes (Signed)
 05/06/2021   09:33am start pitocin with [place] confirmed vtx/vtx by me  12:30pm cx 2/50%/-1  17:17pm cx 3-4/50%/-1, AROM with pitocin @ 34mu/min clear fluid  Scalp clip applied on twin A(L) Paige [name]  19:58pm  cx 4/75%/-1, pitocin to 55mu/min Paige [name]  21:15pm cx 5/75%/0, pitocin @ 59mu/min soft, there was on variable Paige [name]  22:34pm cx complete/0, pitocin @ 59mu/min  22:50pm to OR

## 2021-05-06 NOTE — Anesthesia Procedure Notes (Signed)
 CSE Block    Date/Time: 05/06/2021 6:50 PM    Patient Location:  OB  Reason for Block: labor analgesia    Staff:     Anesthesiologist:  Stanford Scotland, MD    Performed by:  Anesthesiologist  patient identified, anesthesia consent, monitors and equipment checked, pre-op evaluation and timeout performed    CSE:     Patient Position:  Sitting    Prep: ChloraPrep      Sterility Prep:  Sterile gloves, mask and drape    Sedation Level: awake      Monitoring:  Heart rate, blood pressure and continuous pulse oximetry    Approach:  Midline  Spinal Needle:     Needle Type:  Whitacre    Needle Gauge:  25 G    Needle Length:  15 cm  Epidural Needle:     LOR Technique:  LOR air    Guidance: landmarks      Needle Type:  Tuohy    Needle Gauge:  17 G    Needle Length:  10 cm  LOR Depth:  5.5  Location:  L4-5  Catheter:     Catheter at Skin Depth:  10    Catheter Securement Method:  Occlusive dressing and tape    Test Dose:  Negative    Dural puncture only?:  Yes  Medications:     1% lidocaine used to infiltrate the skin and subcutaneous tissue       lidocaine-epinephrine (PF) (Xylocaine W/EPI) 1.5 %-1:200,000 injection - epidural   3 mL - 05/06/2021 6:53:00 PM   2 mL - 05/06/2021 6:54:00 PM  Assessment:     Number of Attempts:  1    Procedure Assessment: patient tolerated procedure well with no complications    Notes:      + CSF via spinal needle only, none with Tuohy or catheter; no IT meds given

## 2021-05-07 ENCOUNTER — Encounter (HOSPITAL_BASED_OUTPATIENT_CLINIC_OR_DEPARTMENT_OTHER): Admitting: Obstetrics & Gynecology

## 2021-05-07 LAB — CBC
Hematocrit: 38.1 % (ref 32.0–47.0)
Hemoglobin: 12 g/dL (ref 11.0–16.0)
MCH: 28 pg (ref 26.0–34.0)
MCHC: 31.5 g/dL (ref 31.0–37.0)
MCV: 88.8 fL (ref 80.0–100.0)
MPV: 11.7 fL (ref 9.1–12.4)
NRBC %: 0.1 % — ABNORMAL HIGH (ref 0.0–0.0)
NRBC Absolute: 0.02 10*3/uL (ref 0.00–2.00)
Platelets: 137 10*3/uL — ABNORMAL LOW (ref 150–400)
RBC: 4.29 M/uL (ref 3.70–5.20)
RDW-CV: 14.7 % — ABNORMAL HIGH (ref 11.5–14.5)
RDW-SD: 46.1 fL (ref 35.0–51.0)
WBC: 14.6 10*3/uL — ABNORMAL HIGH (ref 4.0–11.0)

## 2021-05-07 MED ORDER — docusate sodium (Colace) capsule 100 mg
100 | Freq: Two times a day (BID) | ORAL | Status: DC | PRN
Start: 2021-05-07 — End: 2021-05-09
  Administered 2021-05-07 – 2021-05-09 (×5): 100 mg via ORAL

## 2021-05-07 MED ORDER — glycerin-witch hazeL (Tucks) pad 1 each
12.5-50 | TOPICAL | Status: DC | PRN
Start: 2021-05-07 — End: 2021-05-09

## 2021-05-07 MED ORDER — ibuprofen tablet 600 mg
600 | Freq: Four times a day (QID) | ORAL | Status: DC | PRN
Start: 2021-05-07 — End: 2021-05-09
  Administered 2021-05-07 – 2021-05-09 (×8): 600 mg via ORAL

## 2021-05-07 MED ORDER — miSOPROStoL (Cytotec) tablet 800 mcg
200 | Freq: Once | ORAL | Status: DC | PRN
Start: 2021-05-07 — End: 2021-05-09

## 2021-05-07 MED ORDER — acetaminophen (Tylenol) tablet 650 mg
325 | Freq: Four times a day (QID) | ORAL | Status: DC | PRN
Start: 2021-05-07 — End: 2021-05-09
  Administered 2021-05-07 – 2021-05-09 (×7): 650 mg via ORAL

## 2021-05-07 MED ORDER — oxytocin (Pitocin) infusion in lactated Ringers 30 units/500 mL
30 | INTRAVENOUS | Status: DC
Start: 2021-05-07 — End: 2021-05-09

## 2021-05-07 MED ORDER — lactated Ringer's infusion
INTRAVENOUS | Status: DC
Start: 2021-05-07 — End: 2021-05-09

## 2021-05-07 MED ORDER — polyethylene glycol (Glycolax) packet 17 g
17 | Freq: Every day | ORAL | Status: DC | PRN
Start: 2021-05-07 — End: 2021-05-09

## 2021-05-07 MED ORDER — acetaminophen (Tylenol) solution 650 mg
650 | Freq: Four times a day (QID) | ORAL | Status: DC | PRN
Start: 2021-05-07 — End: 2021-05-09

## 2021-05-07 MED ORDER — tranexamic acid injection 1,000 mg
1000 | Freq: Once | INTRAVENOUS | Status: DC | PRN
Start: 2021-05-07 — End: 2021-05-09

## 2021-05-07 MED ORDER — acetaminophen (Tylenol) suppository 650 mg
650 | Freq: Four times a day (QID) | RECTAL | Status: DC | PRN
Start: 2021-05-07 — End: 2021-05-09

## 2021-05-07 MED ORDER — varicella (Varivax) 1,350 unit/0.5 mL vaccine 0.5 mL
1350 | Freq: Once | SUBCUTANEOUS | Status: DC
Start: 2021-05-07 — End: 2021-05-09

## 2021-05-07 MED ORDER — phenylephrine-cocoa butter (Hemorrhoidal) 0.25-88.44 % 1 suppository
0.25-88.44 | RECTAL | Status: DC | PRN
Start: 2021-05-07 — End: 2021-05-09

## 2021-05-07 MED ORDER — measles, mumps and rubella (MMR II) vaccine 0.5 mL
1000-12500 | Freq: Once | SUBCUTANEOUS | Status: DC | PRN
Start: 2021-05-07 — End: 2021-05-09

## 2021-05-07 MED ORDER — hydrocortisone (Anusol-HC) suppository 25 mg
25 | Freq: Two times a day (BID) | RECTAL | Status: DC | PRN
Start: 2021-05-07 — End: 2021-05-09

## 2021-05-07 MED ORDER — benzocaine-menthoL (Dermoplast) topical spray 1 application
20-0.5 | TOPICAL | Status: DC | PRN
Start: 2021-05-07 — End: 2021-05-09

## 2021-05-07 MED ORDER — lanolin (Lansinoh) 100 % cream 1 application
100 | TOPICAL | Status: DC | PRN
Start: 2021-05-07 — End: 2021-05-09

## 2021-05-07 MED FILL — VARICELLA VIRUS VACCINE LIVE (PF) 1,350 UNIT/0.5 ML SUBCUTANEOUS SUSP: 1350 1,350 unit/0.5 mL | SUBCUTANEOUS | Qty: 0.5

## 2021-05-07 MED FILL — ACETAMINOPHEN 325 MG TABLET: 325 325 mg | ORAL | Qty: 2

## 2021-05-07 MED FILL — ROPIVACAINE 2 MG/ML PCEA: 0.2 0.2 % | EPIDURAL | Qty: 200

## 2021-05-07 MED FILL — MISOPROSTOL 200 MCG TABLET: 200 200 mcg | ORAL | Qty: 5

## 2021-05-07 MED FILL — HYDROMORPHONE 4 MG TABLET: 4 4 mg | ORAL | Qty: 1

## 2021-05-07 MED FILL — ONDANSETRON HCL (PF) 4 MG/2 ML INJECTION SOLUTION: 4 4 mg/2 mL | INTRAMUSCULAR | Qty: 2

## 2021-05-07 MED FILL — TRANEXAMIC ACID 1,000 MG/100 ML(10 MG/ML)IN SOD CHLOR,ISO IV PIGGYBACK: 1000 1,000 mg/100 mL (10 mg/mL) | INTRAVENOUS | Qty: 100

## 2021-05-07 MED FILL — IBUPROFEN 600 MG TABLET: 600 600 mg | ORAL | Qty: 1

## 2021-05-07 MED FILL — OXYTOCIN IN LACTATED RINGERS 30 UNIT/500 ML INTRAVENOUS SOLUTION: 30 30 unit/500 mL | INTRAVENOUS | Qty: 500

## 2021-05-07 MED FILL — DOCUSATE SODIUM 100 MG CAPSULE: 100 100 mg | ORAL | Qty: 1

## 2021-05-07 NOTE — Care Plan (Signed)
 Problem: Adult Inpatient Plan of Care  Goal: Plan of Care Review  Outcome: Ongoing, Progressing  Goal: Patient-Specific Goal (Individualized)  Outcome: Ongoing, Progressing  Goal: Absence of Hospital-Acquired Illness or Injury  Outcome: Ongoing, Progressing  Intervention: Prevent Skin Injury  Recent Flowsheet Documentation  Taken 05/07/2021 0450 by Oswald Hillock, RN  Body Position: position changed independently  Intervention: Prevent and Manage VTE (Venous Thromboembolism) Risk  Recent Flowsheet Documentation  Taken 05/07/2021 0450 by Oswald Hillock, RN  Activity Management: activity adjusted per tolerance  Goal: Optimal Comfort and Wellbeing  Outcome: Ongoing, Progressing  Goal: Readiness for Transition of Care  Outcome: Ongoing, Progressing     Problem: Adjustment to Role Transition (Postpartum Vaginal Delivery)  Goal: Successful Maternal Role Transition  Outcome: Ongoing, Progressing     Problem: Bleeding (Postpartum Vaginal Delivery)  Goal: Hemostasis  Outcome: Ongoing, Progressing     Problem: Infection (Postpartum Vaginal Delivery)  Goal: Absence of Infection Signs/Symptoms  Outcome: Ongoing, Progressing     Problem: Pain (Postpartum Vaginal Delivery)  Goal: Acceptable Pain Control  Outcome: Ongoing, Progressing     Problem: Urinary Retention (Postpartum Vaginal Delivery)  Goal: Effective Urinary Elimination  Outcome: Ongoing, Progressing   Goal Outcome Evaluation:

## 2021-05-07 NOTE — Care Plan (Signed)
 Problem: Adult Inpatient Plan of Care  Goal: Plan of Care Review  Outcome: Ongoing, Progressing  Goal: Patient-Specific Goal (Individualized)  Outcome: Ongoing, Progressing  Goal: Absence of Hospital-Acquired Illness or Injury  Outcome: Ongoing, Progressing  Intervention: Prevent and Manage VTE (Venous Thromboembolism) Risk  Recent Flowsheet Documentation  Taken 05/07/2021 0745 by Corrinne Eagle, RN  Activity Management: ambulated in room  Goal: Optimal Comfort and Wellbeing  Outcome: Ongoing, Progressing  Goal: Readiness for Transition of Care  Outcome: Ongoing, Progressing     Problem: Adjustment to Role Transition (Postpartum Vaginal Delivery)  Goal: Successful Maternal Role Transition  Outcome: Ongoing, Progressing     Problem: Bleeding (Postpartum Vaginal Delivery)  Goal: Hemostasis  Outcome: Ongoing, Progressing     Problem: Infection (Postpartum Vaginal Delivery)  Goal: Absence of Infection Signs/Symptoms  Outcome: Ongoing, Progressing     Problem: Pain (Postpartum Vaginal Delivery)  Goal: Acceptable Pain Control  Outcome: Ongoing, Progressing     Problem: Urinary Retention (Postpartum Vaginal Delivery)  Goal: Effective Urinary Elimination  Outcome: Ongoing, Progressing   Goal Outcome Evaluation:

## 2021-05-07 NOTE — Care Plan (Signed)
 Problem: Adult Inpatient Plan of Care  Goal: Plan of Care Review  Outcome: Ongoing, Progressing  Goal: Patient-Specific Goal (Individualized)  Outcome: Ongoing, Progressing  Goal: Absence of Hospital-Acquired Illness or Injury  Outcome: Ongoing, Progressing  Intervention: Prevent and Manage VTE (Venous Thromboembolism) Risk  Recent Flowsheet Documentation  Taken 05/07/2021 1600 by Doristine Locks, RN  Activity Management: up ad lib  Goal: Optimal Comfort and Wellbeing  Outcome: Ongoing, Progressing  Goal: Readiness for Transition of Care  Outcome: Ongoing, Progressing     Problem: Adjustment to Role Transition (Postpartum Vaginal Delivery)  Goal: Successful Maternal Role Transition  Outcome: Ongoing, Progressing     Problem: Bleeding (Postpartum Vaginal Delivery)  Goal: Hemostasis  Outcome: Ongoing, Progressing     Problem: Infection (Postpartum Vaginal Delivery)  Goal: Absence of Infection Signs/Symptoms  Outcome: Ongoing, Progressing  Intervention: Prevent or Manage Infection  Recent Flowsheet Documentation  Taken 05/07/2021 1600 by Doristine Locks, RN  Perineal Care:   absorbent pad changed   perineum cleansed     Problem: Pain (Postpartum Vaginal Delivery)  Goal: Acceptable Pain Control  Outcome: Ongoing, Progressing     Problem: Urinary Retention (Postpartum Vaginal Delivery)  Goal: Effective Urinary Elimination  Outcome: Ongoing, Progressing   Goal Outcome Evaluation:

## 2021-05-07 NOTE — Progress Notes (Signed)
 Postpartum Progress Note    Assessment/Plan   Paige [name] is a 40 y.o., [phone], who delivered at [redacted]w[redacted]d gestation and is now postpartum day 1 after a vaginal delivery    Principal Problem:    Twin gestation with ninth pregnancy  Active Problems:    Gastroesophageal reflux disease    Diet controlled gestational diabetes mellitus (GDM) in third trimester    Postpartum course: no complications  Routine postop and postpartum care.  Patient is currently breastfeeding  The patient's blood type is A POS. Rhogam is not indicated.      Subjective   The patient is overall feeling well. Patient's pain is well controlled with current medications. Patient is ambulating well, tolerating a regular diet, voiding without difficulty, and passing flatus and has not had a BM. Vaginal bleeding is becoming lighter. Patient denies any fever, chills, chest pain, shortness of breath, nausea, and vomiting. Patient reports no breast or nursing problems. The patient denies emotional concerns today.    Objective   Recent Vital Signs:                                 BP: 116/60  Pulse: 69  Temp: 36.6 C (97.8 F)  RR: 16  Height: 154.9 cm  Weight: 79.4 kg  BMI: Body mass index is 33.08 kg/m.  Intake/Output:     Intake/Output Summary (Last 24 hours) at 05/07/2021 2302  Last data filed at 05/07/2021 0138  Gross per 24 hour   Intake 300 ml   Output 1050 ml   Net -750 ml        Physical Exam:  General: Examination reveals a well developed, well nourished, female, in no acute distress. She is alert and cooperative.  Breasts: breasts appear normal for pregnancy, no suspicious masses, no skin or nipple changes or axillary nodes.  Abdomen: soft, non-tender, and nondistended.  Fundus: below umbilicus.  Perineum: well approximated.  Extremities: no redness or tenderness in the calves or thighs, no edema.  Psychological: awake and alert; oriented to person, place, and time.    Lab Data:  No results found for: WBC, HGB, HCT, PLT    Medications:    Scheduled Meds  varicella, 0.5 mL, subcutaneous, Once      Continuous Infusions  dextrose 5 % and lactated Ringer's, 75 mL/hr, Last Rate: 75 mL/hr (05/06/21 1228)  lactated Ringer's, 75 mL/hr, Last Rate: 75 mL/hr (05/06/21 1228)  lactated Ringer's, 125 mL/hr  oxytocin in lactated Ringers, 1-20 milli-units/min, Last Rate: 83.3 milli-units/min (05/07/21 0023)  oxytocin in lactated Ringers, 5-12 Units/hr  ropivacaine, , Last Rate: 8 mL/hr (05/06/21 1901)      PRN Meds  PRN medications: acetaminophen **OR** acetaminophen **OR** acetaminophen, benzocaine-menthoL, docusate sodium, glycerin-witch hazeL, hydrocortisone, HYDROmorphone, ibuprofen, lanolin, measles, mumps and rubella, miSOPROStoL, phenylephrine-cocoa butter, polyethylene glycol, tranexamic acid    Allergies: Patient has no known allergies.

## 2021-05-07 NOTE — Lactation Note (Signed)
 Breastfeeding  Assisted multip mom Enjoli with reverse pressure softening, hand expression, and pumping. She collected 6 mls. I reviewed folder. Makala has a [employer] through [employer]

## 2021-05-07 NOTE — Anesthesia Post-Procedure Evaluation (Addendum)
 Patient: Paige [name]    Procedure Summary     Date: 05/06/21 Room / Location:     Anesthesia Start: 1836 Anesthesia Stop: 2310    Procedure: Labor Analgesia Diagnosis:     Scheduled Providers:  Responsible Provider: Stanford Scotland, MD    Anesthesia Type: epidural ASA Status: 2          Anesthesia Type: epidural    Vitals Value Taken Time   BP  05/07/21 1543   Temp  05/07/21 1543   Pulse  05/07/21 1543   Resp  05/07/21 1543   SpO2  05/07/21 1543       Anesthesia Post Evaluation Note:    Patient location during evaluation: PACU  Patient participation: able to participate  Level of consciousness: arousable  Cardiovascular and Hydration status: stable  Respiratory Status Stable and Airway Patent: yes  Nausea and Vomiting Control Satisfactory: yes  Pain management: adequate     Vitals reviewed: yes  Unplanned ICU Admission: no      No complications documented.

## 2021-05-07 NOTE — Lactation Note (Signed)
 This note was copied from a baby's chart.  Breastfeeding  Taught Dad how to help mom with tandem latching. Both babies latched deeply with good sucking patterns and maintained for 12 + minutes. Dad independently syringed babies ebm and pdhm both at the breast and by finger. 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

## 2021-05-08 LAB — GLUCOSE: Glucose: 66 mg/dL — ABNORMAL LOW (ref 70–139)

## 2021-05-08 MED ORDER — magnesium hydroxide (Milk of Magnesia) 400 mg/5 mL suspension 30 mL
400 | Freq: Once | ORAL | Status: AC
Start: 2021-05-08 — End: 2021-05-08
  Administered 2021-05-08: 15:00:00 30 mL via ORAL

## 2021-05-08 MED FILL — IBUPROFEN 600 MG TABLET: 600 600 mg | ORAL | Qty: 1

## 2021-05-08 MED FILL — DOCUSATE SODIUM 100 MG CAPSULE: 100 100 mg | ORAL | Qty: 1

## 2021-05-08 MED FILL — ACETAMINOPHEN 325 MG TABLET: 325 325 mg | ORAL | Qty: 2

## 2021-05-08 MED FILL — MAGNESIUM HYDROXIDE 400 MG/5 ML ORAL SUSPENSION: 400 400 mg/5 mL | ORAL | Qty: 30

## 2021-05-08 NOTE — Lactation Note (Signed)
 Mother and babies seen at time of feeding. Mother has been alternating sides for feedings and states that her left breast produced twice as much with her previous 2 children, states that twin A is less active at breast. Babies both in football position and I assisted with moving twin A in deeper with added breast compressions and then nursed briefly. Discussed continuation of syringe supps of pdhm, increasing pumping to 5-6 times in the next 24 hrs, breast massage and HE every 1-2 hrs or as much as she can. Nipples sl sore, pink and intact. Plan to be reevaluated tomorrow. Mother will call for help as needed.

## 2021-05-08 NOTE — Care Plan (Signed)
 Problem: Adult Inpatient Plan of Care  Goal: Plan of Care Review  Outcome: Ongoing, Progressing  Goal: Patient-Specific Goal (Individualized)  Outcome: Ongoing, Progressing  Goal: Absence of Hospital-Acquired Illness or Injury  Outcome: Ongoing, Progressing  Intervention: Prevent and Manage VTE (Venous Thromboembolism) Risk  Recent Flowsheet Documentation  Taken 05/08/2021 0813 by Elnita Maxwell Geoffrey Mankin, RN  Activity Management: up ad lib  Goal: Optimal Comfort and Wellbeing  Outcome: Ongoing, Progressing  Intervention: Monitor Pain and Promote Comfort  Recent Flowsheet Documentation  Taken 05/08/2021 0813 by Elnita Maxwell Ramie Erman, RN  Pain Management Interventions: medication offered  Goal: Readiness for Transition of Care  Outcome: Ongoing, Progressing     Problem: Adjustment to Role Transition (Postpartum Vaginal Delivery)  Goal: Successful Maternal Role Transition  Outcome: Ongoing, Progressing     Problem: Bleeding (Postpartum Vaginal Delivery)  Goal: Hemostasis  Outcome: Ongoing, Progressing     Problem: Infection (Postpartum Vaginal Delivery)  Goal: Absence of Infection Signs/Symptoms  Outcome: Ongoing, Progressing  Intervention: Prevent or Manage Infection  Recent Flowsheet Documentation  Taken 05/08/2021 0813 by Elnita Maxwell Makih Stefanko, RN  Perineal Care:   absorbent pad changed   perineum cleansed     Problem: Pain (Postpartum Vaginal Delivery)  Goal: Acceptable Pain Control  Outcome: Ongoing, Progressing  Intervention: Prevent or Manage Pain  Recent Flowsheet Documentation  Taken 05/08/2021 0813 by Elnita Maxwell Alexxus Sobh, RN  Pain Management Interventions: medication offered     Problem: Urinary Retention (Postpartum Vaginal Delivery)  Goal: Effective Urinary Elimination  Outcome: Ongoing, Progressing   Goal Outcome Evaluation:

## 2021-05-08 NOTE — Progress Notes (Signed)
 Postpartum Progress Note    Assessment/Plan   Paige [name] is a 40 y.o., [phone], who delivered at [redacted]w[redacted]d gestation and is now postpartum day 2 after a vaginal delivery    Principal Problem:    Twin gestation with ninth pregnancy  Active Problems:    Gastroesophageal reflux disease    Diet controlled gestational diabetes mellitus (GDM) in third trimester    Postpartum course: no complications  Routine postop and postpartum care.  Patient is currently breastfeeding  The patient's blood type is A POS. Rhogam is not indicated.      Subjective   The patient is overall feeling well. Patient's pain is well controlled with current medications. Patient is ambulating well, tolerating a regular diet, voiding without difficulty and passing flatus and has not had a BM. Vaginal bleeding is becoming lighter. Patient denies any fever, chills, chest pain, shortness of breath, nausea and vomiting. Patient reports no breast or nursing problems. The patient denies emotional concerns today.    Objective   Recent Vital Signs:                                 BP: 100/68  Pulse: 70  Temp: 36.7 C (98.1 F)  RR: 16  Height: 154.9 cm  Weight: 79.4 kg  BMI: Body mass index is 33.08 kg/m.  Intake/Output: No intake or output data in the 24 hours ending 05/09/21 1008     Physical Exam:  General: Examination reveals a well developed, well nourished, female, in no acute distress. She is alert and cooperative.  Breasts: breasts appear normal for pregnancy, no suspicious masses, no skin or nipple changes or axillary nodes.  Abdomen: soft, non-tender and nondistended.  Fundus: below umbilicus.  Perineum: well approximated.  Extremities: no redness or tenderness in the calves or thighs, no edema.  Psychological: awake and alert; oriented to person, place, and time.    Lab Data:  No results found for: WBC, HGB, HCT, PLT    Medications:   Scheduled Meds  varicella, 0.5 mL, subcutaneous, Once      Continuous Infusions  dextrose 5 % and lactated  Ringer's, 75 mL/hr, Last Rate: 75 mL/hr (05/06/21 1228)  lactated Ringer's, 75 mL/hr, Last Rate: 75 mL/hr (05/06/21 1228)  lactated Ringer's, 125 mL/hr  oxytocin in lactated Ringers, 1-20 milli-units/min, Last Rate: 83.3 milli-units/min (05/07/21 0023)  oxytocin in lactated Ringers, 5-12 Units/hr  ropivacaine, , Last Rate: 8 mL/hr (05/06/21 1901)      PRN Meds  PRN medications: acetaminophen **OR** acetaminophen **OR** acetaminophen, benzocaine-menthoL, docusate sodium, glycerin-witch hazeL, hydrocortisone, HYDROmorphone, ibuprofen, lanolin, measles, mumps and rubella, miSOPROStoL, phenylephrine-cocoa butter, polyethylene glycol, tranexamic acid    Allergies: Patient has no known allergies.

## 2021-05-08 NOTE — Progress Notes (Signed)
 Postpartum Progress Note    Assessment/Plan   Paige [name] is a 40 y.o., [phone], who delivered at [redacted]w[redacted]d gestation and is now postpartum day 2 after a vaginal delivery   Will give mom today due to constipation    Principal Problem:    Twin gestation with ninth pregnancy  Active Problems:    Gastroesophageal reflux disease    Diet controlled gestational diabetes mellitus (GDM) in third trimester    Postpartum course: no complications  Routine postop and postpartum care.  Patient is currently breastfeeding  The patient's blood type is A POS. Rhogam is not indicated.      Subjective   The patient is overall feeling well. Patient's pain is well controlled with current medications. Patient is ambulating well, tolerating a regular diet, voiding without difficulty and passing flatus and has had a BM. Vaginal bleeding is becoming lighter. Patient denies any fever, chills, chest pain, shortness of breath, nausea and vomiting. Patient reports no breast or nursing problems. The patient denies emotional concerns today.  She had a very hard small bowel movement yesterday and desires MOM    Objective   Recent Vital Signs:                                 BP: 110/65  Pulse: 65  Temp: 36 C (96.8 F)  RR: 18  Height: 154.9 cm  Weight: 79.4 kg  BMI: Body mass index is 33.08 kg/m.  Intake/Output: No intake or output data in the 24 hours ending 05/08/21 0856     Physical Exam:  General: Examination reveals a well developed, well nourished, female, in no acute distress. She is alert and cooperative.  Breasts: breasts appear normal for pregnancy, no suspicious masses, no skin or nipple changes or axillary nodes.  Abdomen: soft, non-tender and nondistended.  Fundus: below umbilicus.  Perineum: well approximated.  Extremities: no redness or tenderness in the calves or thighs, no edema.  Psychological: awake and alert; oriented to person, place, and time.    Lab Data:  Lab Results   Component Value Date    WBC 14.6 (H) 05/07/2021     HGB 12.0 05/07/2021    HCT 38.1 05/07/2021    PLT 137 (L) 05/07/2021       Medications:   Scheduled Meds  magnesium hydroxide, 30 mL, oral, Once  varicella, 0.5 mL, subcutaneous, Once      Continuous Infusions  dextrose 5 % and lactated Ringer's, 75 mL/hr, Last Rate: 75 mL/hr (05/06/21 1228)  lactated Ringer's, 75 mL/hr, Last Rate: 75 mL/hr (05/06/21 1228)  lactated Ringer's, 125 mL/hr  oxytocin in lactated Ringers, 1-20 milli-units/min, Last Rate: 83.3 milli-units/min (05/07/21 0023)  oxytocin in lactated Ringers, 5-12 Units/hr  ropivacaine, , Last Rate: 8 mL/hr (05/06/21 1901)      PRN Meds  PRN medications: acetaminophen **OR** acetaminophen **OR** acetaminophen, benzocaine-menthoL, docusate sodium, glycerin-witch hazeL, hydrocortisone, HYDROmorphone, ibuprofen, lanolin, measles, mumps and rubella, miSOPROStoL, phenylephrine-cocoa butter, polyethylene glycol, tranexamic acid    Allergies: Patient has no known allergies.

## 2021-05-08 NOTE — Progress Notes (Signed)
 Mother requesting baby to go to nursery.   She has been educated on the benefits of rooming in and was encouraged to resume rooming-in as soon as she is able.   Baby will be brought to mother for breastfeeding.

## 2021-05-09 LAB — PREPARE RBC, LEUKOREDUCED
Barcoded ABO/Rh: 6200
Blood Expiration Date: 202209212359
Product Blood Type: A POS

## 2021-05-09 MED ORDER — acetaminophen (Tylenol) 325 mg tablet
325 | ORAL_TABLET | Freq: Four times a day (QID) | ORAL | 0 refills | 8.00000 days | Status: AC | PRN
Start: 2021-05-09 — End: 2021-05-19

## 2021-05-09 MED ORDER — ibuprofen 600 mg tablet
600 | ORAL_TABLET | Freq: Four times a day (QID) | ORAL | 1 refills | Status: AC | PRN
Start: 2021-05-09 — End: 2021-05-19

## 2021-05-09 MED FILL — IBUPROFEN 600 MG TABLET: 600 600 mg | ORAL | Qty: 1

## 2021-05-09 MED FILL — DOCUSATE SODIUM 100 MG CAPSULE: 100 100 mg | ORAL | Qty: 1

## 2021-05-09 MED FILL — ACETAMINOPHEN 325 MG TABLET: 325 325 mg | ORAL | Qty: 2

## 2021-05-09 NOTE — Care Plan (Signed)
 Problem: Adult Inpatient Plan of Care  Goal: Plan of Care Review  Outcome: Ongoing, Progressing  Goal: Patient-Specific Goal (Individualized)  Outcome: Ongoing, Progressing  Goal: Absence of Hospital-Acquired Illness or Injury  Outcome: Ongoing, Progressing  Intervention: Prevent and Manage VTE (Venous Thromboembolism) Risk  Recent Flowsheet Documentation  Taken 05/08/2021 2305 by Paige Junes, RN  Activity Management:   up ad lib   ambulated in room   ambulated outside room  Goal: Optimal Comfort and Wellbeing  Outcome: Ongoing, Progressing  Goal: Readiness for Transition of Care  Outcome: Ongoing, Progressing     Problem: Adjustment to Role Transition (Postpartum Vaginal Delivery)  Goal: Successful Maternal Role Transition  Outcome: Ongoing, Progressing     Problem: Bleeding (Postpartum Vaginal Delivery)  Goal: Hemostasis  Outcome: Ongoing, Progressing     Problem: Infection (Postpartum Vaginal Delivery)  Goal: Absence of Infection Signs/Symptoms  Outcome: Ongoing, Progressing  Intervention: Prevent or Manage Infection  Recent Flowsheet Documentation  Taken 05/08/2021 2305 by Paige Junes, RN  Perineal Care:   absorbent pad changed   perineum cleansed     Problem: Pain (Postpartum Vaginal Delivery)  Goal: Acceptable Pain Control  Outcome: Ongoing, Progressing     Problem: Urinary Retention (Postpartum Vaginal Delivery)  Goal: Effective Urinary Elimination  Outcome: Ongoing, Progressing   Goal Outcome Evaluation:

## 2021-05-09 NOTE — Lactation Note (Signed)
 05/09/2021 1120:    LC visited patient, newborns twin A and B and FOB this mid morning. Parents excited and eager for discharge home to reunite the family with two female siblings at home; experienced parents. Dinora has a Unimom Breastpump at home for breastpumping for twins. [facility] Discharge Folder reviewed with parents with good questions. No questions for breastpumping at home. At this time, does not feel the need for an Outpatient [facility] appointment as follow up tomorrow, Friday, May 10, 2021, with Pediatrician. Will call on Monday for the need for Outpatient [facility] Visit if feels the need once breastmilk is in. Has attended [facility] in the past with older female children and enjoyed; will attend with twins once older. Twins A and B with frequent voids and stools and quality tandem breastfeeding with good suck patterns. LC observed a successful breastfeeding with twins and syringe fed with PHDM by parents prior to discharge home. Excellent care of newborns with feedings demonstrated by both parents. LC completed [facility] paperwork for ten bottles of PHDM to be taken home on ice for newborns. Parents have the contact information for further PHDM to be purchased as needed; parents want to assess breastmilk supply and pumping in the next few days to reassess need. Parents and newborns attending appointment for tongue tie assessment and procedure after hospital discharge today. Pediatrician this morning aware of newborns weight loss over 10% during hospital stay and intake and output. Parents pleasant and cooperative.    Toy Cookey BSN RN [employer]

## 2021-05-09 NOTE — Discharge Instructions (Addendum)
 Issues Requiring Follow-Up    Notify your Doctor    if you have any of the following symptoms or health problems:    *Fever >100  * Increased vaginal bleeding  * Foul-smelling vaginal discharge  * Fainting, dizziness, extreme exhaustion  * Frequent urination/burning sensation when urinating  * Increased tenderness of uterus/abdomen  * Severe/continuous headache, visual changes, upper abdominal pain  * Shortness of breath/chest pain  * Calf pain/redness/swelling  * Symptoms of postpartum depression: decreased appetite, sleeplessness,  feeling of hopelessness    For breast feeding:  -continue Lanolin as needed        For Vaginal Delivery:  -continue sitz bath three times a day for 2 weeks  -continue Dermoplast to vulva as needed  Call MD immediately if  * Swelling/discharge/increased tenderness around episiotomy/stitches    Outpatient Follow-Up    call our office to make following appointment if our office did not call you to  make below appointment  (1) 2-week postpartum telehealth for vaginal delivery  (2) 6-week postpartum appointment or earlier if you have any problems early such as breast infection, HA, Chest pain, SOB or depression and anxiety    Make a follow up with your endocrinologist in 2 weeks re: DM

## 2021-05-09 NOTE — Care Plan (Signed)
 Problem: Adult Inpatient Plan of Care  Goal: Plan of Care Review  05/09/2021 0942 by Dante Gang, RN  Outcome: Met  05/09/2021 0821 by Dante Gang, RN  Outcome: Ongoing, Progressing  Goal: Patient-Specific Goal (Individualized)  05/09/2021 0942 by Dante Gang, RN  Outcome: Met  05/09/2021 0821 by Dante Gang, RN  Outcome: Ongoing, Progressing  Goal: Absence of Hospital-Acquired Illness or Injury  05/09/2021 0942 by Dante Gang, RN  Outcome: Met  05/09/2021 0821 by Dante Gang, RN  Outcome: Ongoing, Progressing  Intervention: Prevent Skin Injury  Recent Flowsheet Documentation  Taken 05/09/2021 0730 by Dante Gang, RN  Body Position: position changed independently  Intervention: Prevent and Manage VTE (Venous Thromboembolism) Risk  Recent Flowsheet Documentation  Taken 05/09/2021 0730 by Dante Gang, RN  Activity Management: up ad lib  Goal: Optimal Comfort and Wellbeing  05/09/2021 0942 by Dante Gang, RN  Outcome: Met  05/09/2021 0821 by Dante Gang, RN  Outcome: Ongoing, Progressing  Intervention: Monitor Pain and Promote Comfort  Recent Flowsheet Documentation  Taken 05/09/2021 0752 by Dante Gang, RN  Pain Management Interventions:   pain management plan reviewed with patient/caregiver   medication offered  Goal: Readiness for Transition of Care  05/09/2021 0942 by Dante Gang, RN  Outcome: Met  05/09/2021 0821 by Dante Gang, RN  Outcome: Ongoing, Progressing     Problem: Adjustment to Role Transition (Postpartum Vaginal Delivery)  Goal: Successful Maternal Role Transition  05/09/2021 0942 by Dante Gang, RN  Outcome: Met  05/09/2021 0821 by Dante Gang, RN  Outcome: Ongoing, Progressing     Problem: Bleeding (Postpartum Vaginal Delivery)  Goal: Hemostasis  05/09/2021 0942 by Dante Gang, RN  Outcome: Met  05/09/2021 0821 by Dante Gang, RN  Outcome: Ongoing, Progressing     Problem: Infection (Postpartum Vaginal Delivery)  Goal: Absence of Infection Signs/Symptoms  05/09/2021 0942 by Dante Gang,  RN  Outcome: Met  05/09/2021 0821 by Dante Gang, RN  Outcome: Ongoing, Progressing     Problem: Pain (Postpartum Vaginal Delivery)  Goal: Acceptable Pain Control  05/09/2021 0942 by Dante Gang, RN  Outcome: Met  05/09/2021 0821 by Dante Gang, RN  Outcome: Ongoing, Progressing  Intervention: Prevent or Manage Pain  Recent Flowsheet Documentation  Taken 05/09/2021 0752 by Dante Gang, RN  Pain Management Interventions:   pain management plan reviewed with patient/caregiver   medication offered     Problem: Urinary Retention (Postpartum Vaginal Delivery)  Goal: Effective Urinary Elimination  05/09/2021 0942 by Dante Gang, RN  Outcome: Met  05/09/2021 0821 by Dante Gang, RN  Outcome: Ongoing, Progressing   Goal Outcome Evaluation:

## 2021-05-09 NOTE — Care Plan (Signed)
 Problem: Adult Inpatient Plan of Care  Goal: Plan of Care Review  Outcome: Ongoing, Progressing  Goal: Patient-Specific Goal (Individualized)  Outcome: Ongoing, Progressing  Goal: Absence of Hospital-Acquired Illness or Injury  Outcome: Ongoing, Progressing  Intervention: Prevent Skin Injury  Recent Flowsheet Documentation  Taken 05/09/2021 0730 by Dante Gang, RN  Body Position: position changed independently  Intervention: Prevent and Manage VTE (Venous Thromboembolism) Risk  Recent Flowsheet Documentation  Taken 05/09/2021 0730 by Dante Gang, RN  Activity Management: up ad lib  Goal: Optimal Comfort and Wellbeing  Outcome: Ongoing, Progressing  Intervention: Monitor Pain and Promote Comfort  Recent Flowsheet Documentation  Taken 05/09/2021 0752 by Dante Gang, RN  Pain Management Interventions:   pain management plan reviewed with patient/caregiver   medication offered  Goal: Readiness for Transition of Care  Outcome: Ongoing, Progressing     Problem: Adjustment to Role Transition (Postpartum Vaginal Delivery)  Goal: Successful Maternal Role Transition  Outcome: Ongoing, Progressing     Problem: Bleeding (Postpartum Vaginal Delivery)  Goal: Hemostasis  Outcome: Ongoing, Progressing     Problem: Infection (Postpartum Vaginal Delivery)  Goal: Absence of Infection Signs/Symptoms  Outcome: Ongoing, Progressing     Problem: Pain (Postpartum Vaginal Delivery)  Goal: Acceptable Pain Control  Outcome: Ongoing, Progressing  Intervention: Prevent or Manage Pain  Recent Flowsheet Documentation  Taken 05/09/2021 0752 by Dante Gang, RN  Pain Management Interventions:   pain management plan reviewed with patient/caregiver   medication offered     Problem: Urinary Retention (Postpartum Vaginal Delivery)  Goal: Effective Urinary Elimination  Outcome: Ongoing, Progressing   Goal Outcome Evaluation:

## 2021-05-09 NOTE — Other (Signed)
 Patient Education   Table of Contents       Postpartum Care After Vaginal Delivery     To view videos and all your education online visit,   [email or web address]   or scan this QR code with your smartphone.                    Postpartum Care After Vaginal Delivery     The following information offers guidance about how to care for yourself from the time you deliver your baby to 6?12 weeks after delivery (postpartum period). If you have problems or questions, contact your health care provider for more specific instructions.   Follow these instructions at home:   Vaginal bleeding        It is normal to have vaginal bleeding (lochia) after delivery. Wear a sanitary pad for bleeding and discharge.       During the first week after delivery, the amount and appearance of lochia is often similar to a menstrual period.       Over the next few weeks, it will gradually decrease to a dry, yellow-brown discharge.       For most women, lochia stops completely by 4?6 weeks after delivery, but can vary.      Change your sanitary pads frequently. Watch for any changes in your flow, such as:       A sudden increase in volume.       A change in color.      Large blood clots.       If you pass a blood clot from your vagina, save it and call your health care provider. Do not  flush blood clots down the toilet before talking with your health care provider.      Do not  use tampons or douches until your health care provider approves.       If you are not breastfeeding, your period should return 6?8 weeks after delivery. If you are feeding your baby breast milk only, your period may not return until you stop breastfeeding.     Perineal care            Keep the area between the vagina and the anus (perineum) clean and dry. Use medicated pads and pain-relieving sprays and creams as directed.      If you had a surgical cut in the perineum (episiotomy) or a tear, check the area for signs of infection until you are healed. Check  for:       More redness, swelling, or pain.       Fluid or blood coming from the cut or tear.       Warmth.       Pus or a bad smell.       You may be given a squirt bottle to use instead of wiping to clean the perineum area after you use the bathroom. Pat the area gently to dry it.       To relieve pain caused by an episiotomy, a tear, or swollen veins in the anus (hemorrhoids), take a warm sitz bath 2?3 times a day. In a sitz bath, the warm water should only come up to your hips and cover your buttocks.     Breast care         In the first few days after delivery, your breasts may feel heavy, full, and uncomfortable (breast engorgement). Milk may also leak from your breasts. Ask your health care provider about ways  to help relieve the discomfort.      If you are breastfeeding:       Wear a bra that supports your breasts and fits well. Use breast pads to absorb milk that leaks.       Keep your nipples clean and dry. Apply creams and ointments as told.       You may have uterine contractions every time you breastfeed for up to several weeks after delivery. This helps your uterus return to its normal size.       If you have any problems with breastfeeding, notify your health care provider or lactation consultant.      If you are not breastfeeding:       Avoid touching your breasts. Do not  squeeze out (express) milk. Doing this can make your breasts produce more milk.       Wear a good-fitting bra and use cold packs to help with swelling.     Intimacy and sexuality        Ask your health care provider when you can engage in sexual activity. This may depend upon:       Your risk of infection.       How fast you are healing.       Your comfort and desire to engage in sexual activity.       You are able to get pregnant after delivery, even if you have not had your period. Talk with your health care provider about methods of birth control (contraception) or family planning if you desire future pregnancies.     Medicines          Take over-the-counter and prescription medicines only as told by your health care provider.       Take an over-the-counter stool softener to help ease bowel movements as told by your health care provider.       If you were prescribed an antibiotic medicine, take it as told by your health care provider. Do not  stop taking the antibiotic even if you start to feel better.       Review all previous and current prescriptions to check for possible transfer into breast milk.     Activity         Gradually return to your normal activities as told by your health care provider.       Rest as much as possible. Nap while your baby is sleeping.     Eating and drinking            Drink enough fluid to keep your urine pale yellow.       To help prevent or relieve constipation, eat high-fiber foods every day.       Choose healthy eating to support breastfeeding or weight loss goals.       Take your prenatal vitamins until your health care provider tells you to stop.         General tips/recommendations        Do not  use any products that contain nicotine or tobacco. These products include cigarettes, chewing tobacco, and vaping devices, such as e-cigarettes. If you need help quitting, ask your health care provider.      Do not  drink alcohol, especially if you are breastfeeding.      Do not  take medications or drugs that are not prescribed to you, especially if you are breastfeeding.       Visit your health care provider for a postpartum  checkup within the first 3?6 weeks after delivery.       Complete a comprehensive postpartum visit no later than 12 weeks after delivery.       Keep all follow-up visits for you and your baby.     Contact a health care provider if:         You feel unusually sad or worried.       Your breasts become red, painful, or hard.       You have a fever or other signs of an infection.       You have bleeding that is soaking through one pad an hour or you have blood clots.       You have a severe  headache that doesn't go away or you have vision changes.       You have nausea and vomiting and are unable to eat or drink anything for 24 hours.     Get help right away if:         You have chest pain or difficulty breathing.       You have sudden, severe leg pain.       You faint or have a seizure.       You have thoughts about hurting yourself or your baby.     If you ever feel like you may hurt yourself or others, or have thoughts about taking your own life, get help right away. Go to your nearest emergency department or:      Call your local emergency services (911 in the [place]      The National Suicide Prevention Lifeline at 6198825609. This suicide crisis helpline is open 24 hours a day.      Text the Crisis Text Line at (415) 581-9375 (in the [place]     Summary         The period of time after you deliver your newborn up to 6?12 weeks after delivery is called the postpartum period.       Keep all follow-up visits for you and your baby.       Review all previous and current prescriptions to check for possible transfer into breast milk.       Contact a health care provider if you feel unusually sad or worried during the postpartum period.     This information is not intended to replace advice given to you by your health care provider. Make sure you discuss any questions you have with your health care provider.     Document Released: 10/14/2008Document Revised: 09/02/2021Document Reviewed: 05/17/2020     Elsevier Patient Education ? 2022 Elsevier Inc[employer]

## 2021-05-09 NOTE — Discharge Summary (Addendum)
 Postpartum Discharge Summary    Discharge Diagnosis  Twin gestation with ninth pregnancy    Primary OB Clinician: Richarda Osmond. Dorna Bloom, MD  Gestational Age: [redacted]w[redacted]d  Antepartum complications: diet GDM and mono-di twin  Date of Delivery:      Xan, Sparkman [[id number]]   05/06/2021      Shelle, Galdamez [[id number]]   05/06/2021         Delivered By:      [name], Girl Ranae Plumber [[id number]]   Richarda Osmond. Lenna Gilford, Girl MELESSIA KAUS [[id number]]   Richarda Osmond. Capricia Serda     Delivery type:      [name], Girl MAAT KAFER [[id number]]   Vaginal, Spontaneous      Azie, Mcconahy Girl BELISSA KOOY [[id number]]   Vaginal, Spontaneous     Laceration: first vaginal laceration  Baby: Girl, Birth Weight:      Annete, Ayuso [[id number]]   2635 g      Samarah, Hogle [[id number]]   2657 g     Labor Course/intrapartum complications: 05/06/2021   09:33am start pitocin with [place] confirmed vtx/vtx by me  12:30pm cx 2/50%/-1  17:17pm cx 3-4/50%/-1, AROM with pitocin @ 50mu/min clear fluid  Scalp clip applied on twin A(L) Elza Varricchio  19:58pm  cx 4/75%/-1, pitocin to 24mu/min Shloma Roggenkamp  21:15pm cx 5/75%/0, pitocin @ 70mu/min soft, there was on variable Luqman Perrelli  22:34pm cx complete/0, pitocin @ 71mu/min  22:50pm to OR    Postpartum complications: no complications  Feeding method: Patient is currently breastfeeding  Postpartum labs:   Lab Results   Component Value Date    WBC 14.6 (H) 05/07/2021    HGB 12.0 05/07/2021    HCT 38.1 05/07/2021    PLT 137 (L) 05/07/2021     Discharge Date: No discharge date for patient encounter.  Discharge Medications: Ibuprofen and Acetaminophen  Followup: in 2 and 6 weeks    Test Results Pending At Discharge:  none  Pending Labs     Order Current Status    Tissue exam In process    Prepare RBC, Leukoreduced: 1 Units, Other (Specify in Comments) Preliminary result        Pertinent Physical Exam At Time of Discharge    General Appearance:  No Actue Distress    Breasts:   PRESENT: Normal Lactation.  ABSENT: Engorged/Tender/Lesions,  Engorged,  Tenderness, Lesions, Nipple Soreness.    Abdomen: PRESENT: Soft, Bowel Sounds ABSENT: Distension,Tenderness.                      Incision for cesarean section: Clean/Dry/Intact. Small Tegaderm intake.     Uterus:    Fundus Firm Below Umb    Lochia:   Minimal    Perineum:   No Evidence of Infection,      Extremities:  LAL: No Abnormality Noted, No Edema    Issues Requiring Follow-Up    Notify your Doctor    if you have any of the following symptoms or health problems:    *Fever >100  * Increased vaginal bleeding  * Foul-smelling vaginal discharge  * Fainting, dizziness, extreme exhaustion  * Frequent urination/burning sensation when urinating  * Increased tenderness of uterus/abdomen  * Severe/continuous headache, visual changes, upper abdominal pain  * Shortness of breath/chest pain  * Calf pain/redness/swelling  * Symptoms of postpartum depression: decreased appetite, sleeplessness,  feeling of hopelessness    For breast feeding:  -continue Lanolin as needed  For Bottle feeding:  -wear a tight bra for 48 hours continuously and use ice pack to breast if breast is engorged    For Vaginal Delivery:  -continue sitz bath three times a day for 2 weeks  -continue Dermoplast to vulva as needed  Call MD immediately if  * Swelling/discharge/increased tenderness around episiotomy/stitches      For C-section Delivery:    daily incision care  after shower  (1)pat incision dry and   (2)use hair dryer to blow incision dry for 5 minutes daily  Call MD immediately if  * Increased tenderness/redness around incision  * Increased drainage/bleeding from incision      Outpatient Follow-Up    call our office to make following appointment if our office did not call you to  make below appointment  (1) 2-week postop appointment for cesarean section and       2-week postpartum telehealth for vaginal delivery  (2) 6-week postpartum appointment or earlier if you have any problems early such as breast infection, HA, Chest pain,  SOB or depression and anxietyPostpartum Discharge Summary  IOL with NSVD on 05/06/2021    Discharge Diagnosis  Twin gestation with ninth pregnancy    Primary OB Clinician: Richarda Osmond. Dorna Bloom, MD   Assistant: Dr. Harless Nakayama   Gestational Age: [redacted]w[redacted]d  Antepartum complications: mono-di twin, diet control DM  Date of Delivery:      Vonda, Harth [[id number]]   05/06/2021      Tambra, Muller [[id number]]   05/06/2021         Delivered By: Richarda Osmond. Dorna Bloom, MD     [name], Girl Ranae Plumber [[id number]]   Richarda Osmond. Lenna Gilford, Girl ESSENCE MERLE [[id number]]   Richarda Osmond. Delainee Tramel     Delivery type:  NSVD     Ludolph, Girl JAYLIN ROUNDY [[id number]]   Vaginal, Spontaneous      Benigna, Delisi Girl MARLINDA MIRANDA [[id number]]   Vaginal, Spontaneous     Laceration: first degree  Baby: Girl, Birth Weight:      Aishah, Teffeteller [[id number]]   2635 g      Lajeana, Strough [[id number]]   2657 g     Labor Course/intrapartum complications: 05/06/2021   09:33am start pitocin with [place] confirmed vtx/vtx by me  12:30pm cx 2/50%/-1  17:17pm cx 3-4/50%/-1, AROM with pitocin @ 92mu/min clear fluid  Scalp clip applied on twin A(L) Tamsen Reist  19:58pm  cx 4/75%/-1, pitocin to 7mu/min Cambell Rickenbach  21:15pm cx 5/75%/0, pitocin @ 63mu/min soft, there was on variable Hamad Whyte  22:34pm cx complete/0, pitocin @ 53mu/min  22:50pm to OR    Postpartum complications: no complications  Feeding method: Patient is currently breastfeeding  Postpartum labs:   Lab Results   Component Value Date    WBC 14.6 (H) 05/07/2021    HGB 12.0 05/07/2021    HCT 38.1 05/07/2021    PLT 137 (L) 05/07/2021     Discharge Date: No discharge date for patient encounter.  Discharge Medications: Ibuprofen and Acetaminophen  Followup: in 2 and 6 weeks    Test Results Pending At Discharge:  none  Pending Labs     Order Current Status    Tissue exam In process    Prepare RBC, Leukoreduced: 1 Units, Other (Specify in Comments) Preliminary result        Pertinent Physical Exam At Time of Discharge  She had a normal bowel  movement yet  General Appearance:  No Actue Distress    Breasts:   PRESENT: Normal Lactation.  ABSENT: Engorged/Tender/Lesions, Engorged,  Tenderness, Lesions, Nipple Soreness.    Abdomen: PRESENT: Soft, Bowel Sounds ABSENT: Distension,Tenderness.                      Uterus:    Fundus Firm Below Umb    Lochia:   Minimal    Perineum:   No Evidence of Infection,      Extremities:  LAL: No Abnormality Noted, No Edema    Issues Requiring Follow-Up    Notify your Doctor    if you have any of the following symptoms or health problems:    *Fever >100  * Increased vaginal bleeding  * Foul-smelling vaginal discharge  * Fainting, dizziness, extreme exhaustion  * Frequent urination/burning sensation when urinating  * Increased tenderness of uterus/abdomen  * Severe/continuous headache, visual changes, upper abdominal pain  * Shortness of breath/chest pain  * Calf pain/redness/swelling  * Symptoms of postpartum depression: decreased appetite, sleeplessness,  feeling of hopelessness    For breast feeding:  -continue Lanolin as needed        For Vaginal Delivery:  -continue sitz bath three times a day for 2 weeks  -continue Dermoplast to vulva as needed  Call MD immediately if  * Swelling/discharge/increased tenderness around episiotomy/stitches    Outpatient Follow-Up    call our office to make following appointment if our office did not call you to  make below appointment  (1) 2-week postpartum telehealth for vaginal delivery  (2) 6-week postpartum appointment or earlier if you have any problems early such as breast infection, HA, Chest pain, SOB or depression and anxiety    Make an appointment with your endocrinologist in 2 weeks after delivery re: your DM

## 2021-05-09 NOTE — Nursing Note (Signed)
 Discharge instructions reviewed with pt.  Medications reviewed including last doses given during hospitalization.  Follow up appts and care reviewed.  Pt acknowledges receiving "Your Guide to Postpartum and Newborn Care" teaching booklet.  Referred to that booklet for additional teaching.  AVS reviewed and signed by pt.  Signed copy placed in chart.  Additional copy given to pt.  Questions answered.  Pt verbalized understanding of teaching.  Pt denied concerns at time of discharge.  Pt stable at time of discharge and ambulated off unit.

## 2021-05-13 ENCOUNTER — Ambulatory Visit
Admission: RE | Admit: 2021-05-13 | Discharge: 2021-05-13 | Disposition: A | Discharge status: Home / Self Care | Source: Ambulatory Visit | Attending: Obstetrics & Gynecology | Admitting: Obstetrics & Gynecology

## 2021-05-13 ENCOUNTER — Other Ambulatory Visit

## 2021-05-13 LAB — TISSUE PATHOLOGY

## 2021-05-13 NOTE — Lactation Note (Signed)
 Breastfeeding Consult Notes For: Paige [name] and babies Paige [name] and Paige [name]    Babies DOB: 05/06/[birth year]  Pediatrician: Dr. Abe People  OB: Dr. Eartha Inch age at birth: 36-3  Birth Weight Paige [name]: 5-12  Birth Weight Paige [name]: 5-13  Breastfeeding History: 40 y/o for 15 mos  40 y/o for 2.5 yrs  Breastfeeding Goal: 2 yrs  Medical Issues: PCOS  Medications: denies  CS or Vag Delivery: vag  Consult Goals: make sure babies are getting enough milk / pumping plan  Breast / Nipple Assessment: wnl  Infant Findings: wnl both had TT released by ENT Paige [name]  Pre Feed Weight:  Paige [name]: 5-2.4  Paige [name]: 5-3.8  Post feed Weight:   Paige [name]-5-3.6 ( 16 min)  Lily - .9 (34 min)  Milk Transferred:  Paige [name]: 1.2 oz  Lily: .9 oz  Feedings in 24 hrs: 8  Length of feed: 15-20 min  Swallowing:   Paige [name]: yes  Paige [name]: Sometimes  Satisfied: sometimes  Breast Pump: Unimom  Pumping Frequency: 6xs  Pumping Time: 10-15 min  Pumped Volume: 60-120  Wet Diapers in 24 hrs: 6-8  Stools in 24 hrs: 3  Stool Color: yellow  Supplementing: breastmilk  Supplement Frequency: 8xs   Supplement Volume: 25 mls  Supplement Method: syringe / finger    Summary:   Babies tandem nursed in the laid back position. Mom used small nipple shields to see if babies stayed more efficient at the breast with them. Lily took .9 oz in 34 minutes and Paige [name] tool 1.2 oz in 16 minutes. Parents gave each baby 25 mls after nursing.    Care Plan:  Tandem breastfeed the babies on request or at least 8xs in 24 hrs.  Use a small nipple shield as needed with Paige [name].    Breastfeed both babies skin to skin.    Give 1 oz of breastmilk to each baby after breastfeeding. Use your Lansinoh bottles with slow nipples.    After 5 of the breastfeedings, pump with massage to collect your supplement.    Look for 2-3 poops and 6-8 wet diapers in 24 hrs.    [facility] for free ongoing support.    You are doing a great job!      Charm Barges [facility]  [phone]

## 2021-05-16 ENCOUNTER — Other Ambulatory Visit

## 2021-05-17 ENCOUNTER — Encounter (INDEPENDENT_AMBULATORY_CARE_PROVIDER_SITE_OTHER): Admitting: Women's Health

## 2021-05-20 ENCOUNTER — Other Ambulatory Visit

## 2021-05-21 ENCOUNTER — Encounter

## 2021-05-21 ENCOUNTER — Telehealth (INDEPENDENT_AMBULATORY_CARE_PROVIDER_SITE_OTHER): Admitting: Women's Health

## 2021-05-21 ENCOUNTER — Other Ambulatory Visit

## 2021-05-21 ENCOUNTER — Ambulatory Visit
Admission: RE | Admit: 2021-05-21 | Discharge: 2021-05-21 | Disposition: A | Discharge status: Home / Self Care | Source: Ambulatory Visit | Attending: Obstetrics & Gynecology | Admitting: Obstetrics & Gynecology

## 2021-05-21 ENCOUNTER — Encounter (INDEPENDENT_AMBULATORY_CARE_PROVIDER_SITE_OTHER)

## 2021-05-21 NOTE — Progress Notes (Signed)
 [facility] [place]  [address] [place] [address]  [phone]    Patient ID: Paige [name] is a 40 y.o. female who presents for 2 week postpartum visit - video visit.    DRAW CBC AT 6WK PP VISIT    Cancer family history screening done on 06/09/19, 06/14/20    plan: she does not meet current guidelines for this test. She declined this test today.     Date: 05/21/21  CC: 2 week postpartum telehealth - video visit  s/p vaginal delivery 05/06/21, twin girls, 36w 3d, 2635/2657g ([name] and [name])    No pain, no constipation  Light vaginal bleeding  breastfeeding, going well  EPDS = 3 she is doing well    Other c/o:  draw cbc at 6 wk visit, GDM - needs endocrine f/u - scheduled in 09/2021  G4P3  SAB 2013  on metformin, gdm in prior preg - reports prescribed by dr Dorna Bloom      LMP:08/24/20    Last mammogram : 07/23/20 birads1  07/18/19     Last pap: 06/14/20 NL, HPV NEG  06/09/19, 07/30/17 wnl, 10/31/15 11/01/14 NILM, HPV Neg    Any history of abnormal pap  in 2002    Marital status: Married    Lives: w/ husband, 2 sons ([name]/Luke born 2014/2017) and 2 twin dtrs Lily and Lajoyce Corners born 04/2021    Sexually active                 current partner:   42+ y    Birth control: none    HPV vaccine:  Y  ALLERGIES: NKDA  Medications: Metformin, prenatal vitamin, vit D, Allegra, [employer]OMEN[employer] MVT, she works out side  all   adv pnv/dha and vit d   she was on metformin  she is done with with breast feeding.   GDM - diet control 2nd pregnancy in 2017        Current Outpatient Medications   Medication Instructions   . aspirin 81 mg capsule No dose, route, or frequency recorded.   . blood sugar diagnostic (OneTouch Verio test strips) strip Wendall Mola   . blood sugar diagnostic (OneTouch Verio test strips) strip Wendall Mola   . blood-glucose meter (OneTouch Verio Flex meter) misc Wendall Mola   . CALCIUM CARBONATE ORAL 1 tablet, oral, Daily   . cetirizine (ZYRTEC) 10 mg, oral, Daily, 1 by mouth  daily for allergies   . cholecalciferol (vitamin D3) 2,000 Units, oral, Daily RT   . lancets (OneTouch Delica Lancets) 30 gauge misc TEST BLOOD SUGAR FOUR TIMES DAILY   . lancets 33 gauge misc Wendall Mola   . PRENATAL 105-IRON-FOLIC AC-DHA ORAL 1 tablet, oral, Daily     No Known Allergies    Review of Systems   Constitutional: Negative.  Negative for chills, fatigue and fever.   Respiratory: Negative for cough, shortness of breath and wheezing.    Cardiovascular: Negative for chest pain and palpitations.   Gastrointestinal: Negative.  Negative for abdominal distention, abdominal pain, constipation, diarrhea, nausea and vomiting.   Genitourinary: Negative.  Negative for dyspareunia, dysuria, flank pain, menstrual problem, pelvic pain, vaginal bleeding and vaginal discharge.   Neurological: Negative for dizziness, syncope, light-headedness and headaches.   Psychiatric/Behavioral: Negative for dysphoric mood and suicidal ideas. The patient is not nervous/anxious.    All other systems reviewed and are negative.       Objective   Visit Vitals  OB Status  Recent pregnancy         Physical Exam  Constitutional:       General: She is not in acute distress.     Appearance: Normal appearance. She is not ill-appearing.   Neurological:      Mental Status: She is alert.         Assessment/Plan   Encounter Diagnosis   Name Primary?   . Routine postpartum follow-up Yes     adv to continue on PNV, Calcium, Vitamin D  adv avoid intercourse, exercise until next visit.  discussed postpartum depression and support services, she is doing well but will call if anything changes.  reviewed s/s PET and to call if any devleop, low-risk but possible up to 6 weeks postpartum  adv to call with any questions/concerns, otherwise rto for 6 wk pp visit  she verbalized understanding of the above, all questions answered.      This real-time, interactive virtual Telehealth encounter was done by video with the patient's verbal consent. Two patient  identifiers were used and confirmed. Physical location of the patient: home Patient resides in: [place]  Physical location of the provider: office. Other participants/involvement: none  Total minutes spent: 83      Dr. Dorna Bloom provided supervision for this service and is in agreement with the assessment and plan as stated above.     signed Ferd Hibbs, NP    signed Richarda Osmond. Dorna Bloom MD

## 2021-05-21 NOTE — Lactation Note (Addendum)
 Breastfeeding Consult Notes For: Paige [name] and babies Paige [name] and Paige [name]    Babies DOB: 05/06/[birth year]  Pediatrician: Dr. Abe People  OB: Dr. Eartha Inch age at birth: 36-3  Birth Weight [name]: 5-12  Birth Weight [name]: 5-13  Breastfeeding History: 40 y/o for 70 mos  40 y/o for 2.5 yrs  Breastfeeding Goal: 2 yrs  Medical Issues: PCOS  Medications: denies  CS or Vag Delivery: vag  Consult Goals: make sure babies are getting enough milk   Breast / Nipple Assessment: wnl  Infant Findings: wnl both had TT released by ENT Susann Givens  Pre Feed Weight:  [name]: 5-9.8  Lily: 5-13.4  Post feed Weight:   [name]- 5-11.5  Lily - 01-27-13  Milk Transferred:  [name]: 1.7 oz  Lily: 1.6 oz  Feedings in 24 hrs: 9  Length of feed: 15 min  Swallowing: yes  [name]: yes  [name]: yes  Satisfied: yes  Breast Pump: Unimom  Pumping Frequency: 1 xs  Pumping Time: 15 min  Pumped Volume: 4 oz  Wet Diapers in 24 hrs: 6-8  Stools in 24 hrs: 3  Stool Color: yellow  Supplementing: breastmilk  Supplement Frequency: 2 xs   Supplement Volume: 30 mls  Supplement Method: bottles    Summary:   Babies tandem nursed in the football position.  Lily took 1.6 oz in 28 min and [name] took 1.7 oz in 22 minutes.     Care Plan:              Breastfeed babies on request or a least 9xs in 24 hrs.  Continue to pump 1 x in 24 hrs.  Continue to give each baby (2) one oz bottles of breastmilk each day.  Look for 2-3 poops and 6-8 wet diapers in 24 hrs.  Look for a 4 to 7 oz weight gain weekly.  Baby Caf for free ongoing help.  You're doing a great job!          Charm Barges [facility]  [phone]                    1 hour consult

## 2021-06-10 ENCOUNTER — Other Ambulatory Visit

## 2021-06-14 ENCOUNTER — Other Ambulatory Visit

## 2021-06-17 ENCOUNTER — Ambulatory Visit: Attending: Obstetrics & Gynecology | Admitting: Obstetrics & Gynecology

## 2021-06-17 ENCOUNTER — Other Ambulatory Visit

## 2021-06-17 ENCOUNTER — Encounter (INDEPENDENT_AMBULATORY_CARE_PROVIDER_SITE_OTHER): Admitting: Obstetrics & Gynecology

## 2021-06-17 ENCOUNTER — Ambulatory Visit (INDEPENDENT_AMBULATORY_CARE_PROVIDER_SITE_OTHER): Admitting: Obstetrics & Gynecology

## 2021-06-17 DIAGNOSIS — Z124 Encounter for screening for malignant neoplasm of cervix: Secondary | ICD-10-CM

## 2021-06-17 NOTE — Progress Notes (Signed)
 Husband   Postpartum Depression: Medium Risk   . Last EPDS Total Score: 6   . Last EPDS Self Harm Result: Never     [name],P2, 2014 NVSD Boy, NSVD 2017 boy) DIAMNIOTIC MONOCHORIONIC TWINS  [dd]sono for dates 10/15/20 - twins, small perigest bld - adv pelvic  [dd]f/u sono - perigest blds persisi, adv pelvic rest  [dd]EDC = 05/31/21 by LMP           [dd] covid vax-boosted 06/2020       [dd]NIPT- Low risk, 2 girls   [dd] GC for AMA - telehealth 11/19/20   [dd] NT Scan-w/ MFM check Paige [name]- 11/21/20 @ [facility]-NL NT both Mono-Di twins, no SCH seen, start baby ASA, Q  [dd]MSAFP only @ 16 wks neg 1.8 MoM          [dd] Flu-vax 06/2013  [dd] 16 wk -twin lmt anatomy and fluid check- WNL for both   [dd] 18 wks FS - 4/22 @ [facility] - limited views, f/u 01/14/21 @ [facility]- suboptimal heart views for baby B only, f/u @ fetal echo (this will complete survey)  [dd] fetal echo- 01/21/21 @ 1pm pedi cardio @ [facility] - NL fetal echo, baby B heart views now complete   [xxx] GS every 2 weeks to check twins growth/fluid          [dd] 01/28/21 @  [facility] -fetus 1-breech, 571g, 1-4, 78%, fetus 2-transverse, 544g, 1-3, 66%          [dd] 24 wk-02/12/21 @ 10:45am @[facility]  [facility]-fetus 1=VTX, fetus 2=transverse- NORMAL FLUID          [dd] 26 wk GS- fetus 1-breech, 1052g, 2-5, 61%, fetus 2-transverse-1031g, 2-4, 55%- fluid WNL          [dd] 28 wk-03/12/21 @ [facility]- Normal AFI for both twins          [dd] 30 wk sono w/ OB consult-03/27/21 @ [facility]- fetus 1-VTX, 1741g, 3-13, 58%, fetus 2- VTX, 1775g, 3-15, 64%, bpp=8/8 both          [dd] 32 wk-04/09/21 @ [facility]- fluid normal for each twin, fetus 1- VTX, presenting, fetus-2=VTX, bpp=8/8 both          [dd] 34 wk BPP- Both fetuses are VTX, BPP= 8/8, normal AFI for both twins          [dd] 35 wk GS- fetus 1- VTX, 2512g, 5-9, 44%, fetus 2-VTX, 2564g, 5-10, 50%, fluid WNL for both- 04/25/2021          [xxx] 36 wk BPP-05/06/21 @ 10:45am [facility]   [xxx]GDM -                  [dd] endocrin appt - 03/27/21, f/u 04/10/21            [dd] diabetic nurse appointment - 03/13/21 @ 8am Jos[facility] video visit          [xxx] GS every 4 wks -           [dd] MFM-   HGBEG- Neg                                  [xxx]Tdap  HSV 1&2- neg in past  Hep panels-neg 10/15/20   Rubella - imm 10/15/20  varicella- imm 10/15/20  Parvavirus -unknown   Toxo - Neg 10/15/20  Adv otc ca++  blood type - A+  [dd]CF,SMA,Fragile X- [  dd] Counsyl drawn 10/31/15-NEGATIVE  [dd]Natera-274-declines   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 Paige [name]  PCP Paige [name]  EPDS=0  Preferred Lab - N/A  [dd]Prog until 12 wks -stop 11/16/20  [dd]h/o GDM, on Metformin in 1st trimester - stopped 12/19/20  [dd]BV on GET 10/02/20, tx after 12 weeks  [dd]early glucola 4/12 - failed - passed 3 hour test on 12/26/2020  [Xxx] +covid 12/2020  [Xxx]Borderline Quantiferon test in 01/2021 and will repeat with primary care doctor @ [facility] in 02/2021  [Xxx]consider IOL around 36-37 weeks - 05/10/2021 will be 37 weeks  Non stress test 7/18  [Xxx]03/20/2021 advice get 2nd covid booster    She is here for 6 weeks postpartum visit   She has no complain  She is breast/bottle feeding with twin girls  Birth control method: condom    She will continue prenatal vitamin with DHA and vitamin D 1000IU   She will be off work till 09/2021    She has no other complain     Appearance -within normal limit  Heart - RR  Lung clear  Breast normal exam      Abdomen: soft, non tender with no referred or palpational pain     Pelvic examination:    Vulva: Normal appearance, normal hair distribution, no lesions or masses.     Urethra: Normal, no masses, non-tender, no discharge.     Vagina: Normal, rugated, physiologic discharge, no lesions, no masses, adequate pelvic support.     Cervix: Normal, no motion tenderness, no lesions.     Uterus: smooth, mobile, non-tender, adequate support, firm, no prolapse, average size.     Adnexa: Normal, no masses, mobile, nontender.     Rectum: EXTERNAL HEMORRHOIDS.         Diagnoses and all orders for this visit:  Cervical  cancer screening  -     Pap Smear  Encounter for screening for maternal depression  Routine postpartum follow-up    The pateint was counseled regarding regular monthly self-examinations of the breasts, regular sustained exercise for @ least 30 minutes3-4 times per week, birth control options for the prevention of an unintended pregnancy, routine screening interval for mammogra, prevention of osteoporosis, importance of regular PAP smears, regular use of lap and shoulder seat belts.    She is adviced to make an annual gynecologic appointment in one year when she checks out today or call office immediately to make an office problem apppintment if she has any problems required immediate attentions.    Advice call for mammogram once done with breast feeding

## 2021-06-19 LAB — HPV HIGH RISK DNA WITH REFLEX TO GENOTYPE: HPV, High-Risk: NOT DETECTED

## 2021-06-20 ENCOUNTER — Other Ambulatory Visit

## 2021-06-21 LAB — PAP SMEAR: Interpretation: NEGATIVE

## 2021-06-23 ENCOUNTER — Other Ambulatory Visit (INDEPENDENT_AMBULATORY_CARE_PROVIDER_SITE_OTHER): Admitting: Obstetrics & Gynecology

## 2021-06-25 ENCOUNTER — Other Ambulatory Visit

## 2021-06-26 ENCOUNTER — Ambulatory Visit: Attending: Infectious Disease | Admitting: Infectious Disease

## 2021-06-26 ENCOUNTER — Encounter (HOSPITAL_BASED_OUTPATIENT_CLINIC_OR_DEPARTMENT_OTHER): Admitting: Infectious Disease

## 2021-06-26 ENCOUNTER — Encounter

## 2021-06-26 VITALS — BP 100/62 | HR 79 | Temp 97.3°F | Ht 61.0 in | Wt 154.8 lb

## 2021-06-26 NOTE — Progress Notes (Addendum)
 Margy Clarks MD FIDSA  Clinical Professor  [facility]  Infectious Diseases  [facility]  [facility]      Ledell Peoples  MD  Geraldo Docker  MD    Malachy Chamber  MD    I saw Paige [name] in the office today.  She is a young woman who works at the [employer] who developed  A verrucous lesion on the dorsum of the right fourth finger dating back to  2021 now.  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    Since she was pregnant with twins and it was a bit of a high risk pregnancy  Given her maternal age I felt it best not to provoke  Potential GI or other symptoms by giving her antimicrobial therapy to hasten resolution of the lesion    She agreed and we said we would convene sometime after the delivery to reevaluate the situation    Through the good work of [place] she birthed 2 young girls    In August    So the babies are a few months old.  She is breast-feeding.  The babies have a lot of GI sensitivities she says, and    "colic"  & sleeping poorly so she and her husband are a bit taxed in that regard        Examination    The lesion dorsum of the hand is dry, somewhat smaller but still verrucous and warty  There is about 1 cm in diameter      Laboratory    I had directed the microbiology laboratory at [facility]  To call the state laboratory who is responsible for the isolation of the mycobacteria  And request susceptibility tests  A few months ago.   To do that they would send the isolate to [facility] laboratory  Who specializes in this    Unfortunately, I cannot find any results in epic; this may be because the original specimen was cultured  Before the EPIC conversion so it may have been entered into one of the Legacy systems but not converted.    They are trying to track that time      I did speak to [place] however about deferring any therapy while she is breast-feeding as I am afraid it further may upset the infant's  feedings  And is no signs of progression or worsening despite more than a year of disease.    It certainly would not, however, be nice to rid her of this cutaneous deformity  As its unsightly and there always is a concern of inoculation of a household member inadvertently although  Person-to-person transmission is rare    There will be further details as we get the laboratory parameters requested      Best to all,                ADDENDUM  The susceptibilities were found  And sent to me  When ready we will  RX    Moxifloxacin and Rifampin

## 2021-07-03 ENCOUNTER — Encounter (HOSPITAL_BASED_OUTPATIENT_CLINIC_OR_DEPARTMENT_OTHER)

## 2021-07-05 ENCOUNTER — Encounter (HOSPITAL_BASED_OUTPATIENT_CLINIC_OR_DEPARTMENT_OTHER)

## 2021-07-29 ENCOUNTER — Ambulatory Visit (INDEPENDENT_AMBULATORY_CARE_PROVIDER_SITE_OTHER)

## 2021-07-29 DIAGNOSIS — Z1231 Encounter for screening mammogram for malignant neoplasm of breast: Secondary | ICD-10-CM

## 2021-09-26 ENCOUNTER — Other Ambulatory Visit

## 2021-10-03 ENCOUNTER — Ambulatory Visit (HOSPITAL_BASED_OUTPATIENT_CLINIC_OR_DEPARTMENT_OTHER): Admitting: Internal Medicine

## 2021-10-21 ENCOUNTER — Encounter (HOSPITAL_BASED_OUTPATIENT_CLINIC_OR_DEPARTMENT_OTHER): Admitting: Internal Medicine

## 2021-10-21 ENCOUNTER — Other Ambulatory Visit

## 2021-10-21 ENCOUNTER — Ambulatory Visit: Attending: Internal Medicine | Admitting: Internal Medicine

## 2021-10-21 VITALS — Ht 61.0 in | Wt 150.0 lb

## 2021-10-21 DIAGNOSIS — E282 Polycystic ovarian syndrome: Secondary | ICD-10-CM

## 2021-10-21 NOTE — Progress Notes (Signed)
[facility]  [address]  [place] [address]  Dept Phone: [phone]  Dept Fax: [phone]     Endocrinology Clinic Note    Referring Physician: Frances Furbish, MD    Dr Dorna Bloom    History of present illness:  Paige [name] is a 41 y.o. female G4P2 PMH of PCOS,HL here for f/u of GDM    5 months post paurtum  (girl).      Prior to this I have seen her in 2019 with history of gestational diabetes.  Was on metformin in the first trimester, then gestational diabetes was diet controlled    41 y/o boy-7lb 1 ounce, was born in [place], no GDM then but was on metformin until 14 weeks and was on metformin even in this pregnancy until 14 weeks and was stopped by Dr Dorna Bloom    41 y/o boy- 6lb 12 ounces-GDM, diet controlled      PCOS Diagnosed 8 years ago based on oligomenorrhea, no acne/hirsuitism-clomiphene/IUI second preg first pregnancy she lost the bay at 9 weeks,this preg-no clomiphene but was on metformin. 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,married      Assessment/Plan:  Paige [name] is a 41 y.o. female G4P2 PMH of PCOS,HL here for f/u of GDM    H/o Gestational diabetes:32m post parturm with twins, cwill check a1c now along w/TSH    PCO S: Currently off of metformin .  Metformin will be reinitiated after she stops breast-feeding.Teso <40 discussed plant based diet,resources given ,This has benefited her, menses are regular, LDL up again, breast feeding, strong family history of hypercholesterolemia.  Currently not on any meds.    She prefers video visits for now    Thank you for involving in this patient's care. Please do not hesitate to contact with questions or concerns         Rosina Lowenstein, MD         Pertinent lab review:    No results found for: HGBA1C   Lab Results   Component Value Date    GLUCOSE 66 (L) 05/08/2021      No results found for: LDLCALC   No results found for: HGBA1C  Lab Results   Component Value  Date    GLUF 82 03/06/2021        No results found for: ALT, AST, GGT, ALKPHOS, BILITOT        HX HEMOGLOBIN A1C   Date/Time Value Ref Range Status   10/15/2020 10:55 AM 5.3 <=5.6 % Final     Comment:     Non-diabetic: < 6.0%   Patients with diabetes: < 7.0%         Action suggested: > 8.0%     04/06/2019 12:00 AM 5.1 <=5.6 % Final     Comment:     Non-diabetic: < 6.0 %  Diabetic: Goal:  < 7.0%  Action suggested:  > 8.0%     05/12/2018 08:38 AM 5.2 <=5.6 % Final     Comment:     Non-diabetic: < 6.0 %  Diabetic: Goal:  < 7.0%  Action suggested:  > 8.0%     10/28/2017 07:30 AM 5.2 <=5.6 % Final     Comment:     Non-diabetic: < 6.0 %  Diabetic: Goal:  < 7.0%  Action suggested:  > 8.0%       HX LDL CHOLESTEROL   Date/Time Value Ref  Range Status   04/06/2019 12:00 AM 154.0 (H) <=130 mg/dL Final   16/06/9603 54:09 AM 204.0 (H) <=130 mg/dL Final     HX CHOLESTEROL (LIPR)   Date/Time Value Ref Range Status   04/06/2019 12:00 AM 235.0 (H) <=200 mg/dL Final     HX HDL CHOLESTEROL   Date/Time Value Ref Range Status   04/06/2019 12:00 AM 43.0 >=40 mg/dL Final     HX CREATININE   Date/Time Value Ref Range Status   04/06/2019 12:00 AM 0.73 0.55 - 1.3 mg/dL Final     HX TSH WITH REFLEX   Date/Time Value Ref Range Status   05/12/2018 08:38 AM 0.913 0.358 - 3.74 mclU/ml Final     Comment:     1.  It is generally recommended that thyroid testing be  avoided in hospitalized patients or deferred until recovery  from an acute illness.  2.  When thyroid medication dose is adjusted, the American  Thyroid Association recommends waiting 6 to 8 weeks before  testing TSH levels again.  or endocrinologist.  3.  In patients over the age of 84, the actionable threshold  for TSH may be slightly higher (suggested >6.0 mcIU/ml).  Please consult with a pathologist or endocrinologist.  4.  In pregnancy, if TSH is abnormal, additional testing is  suggested (to include free T4 by equilibrium dialysis). For  additional testing information, consult with  a pathologist          Medical History:    Past Medical History:   Diagnosis Date   . Diabetes mellitus (CMS/HCC) 3017   . STD (sexually transmitted disease) 2005   . Tinea corporis 2006         Surgical History:    Past Surgical History:   Procedure Laterality Date   . D&C FIRST TRIMESTER / TX INCOMPLETE / MISSED / SEPTIC / INDUCED ABORTION N/A 2013    arcuate uterus          Family History:    @BIMFAMHISTORY @       Social History:    No valid surgical or medical questions entered.        Active medical problems:    Patient Active Problem List   Diagnosis   . Encounter for gynecological examination (general) (routine) without abnormal findings   . Eczema   . Allergy, unspecified, initial encounter   . Encounter for screening for malignant neoplasm of cervix   . Hyperlipidemia   . Twin pregnancy, unspecified number of placenta and unspecified number of amniotic sacs, unspecified trimester   . Vitamin D deficiency   . Personal history of gestational diabetes   . Supervision of high risk pregnancy in second trimester   . [redacted] weeks gestation of pregnancy   . Supervision of elderly multigravida, third trimester   . Vesicular skin lesions   . Deviated nasal septum   . Post-nasal drip   . Obesity, unspecified   . Melanocytic nevi, unspecified   . Polycystic ovarian syndrome   . Twin gestation with ninth pregnancy   . Gastroesophageal reflux disease   . Diet controlled gestational diabetes mellitus (GDM) in third trimester         Current medications:      Current Outpatient Medications:   .  CALCIUM CARBONATE ORAL, Take 1 tablet by mouth in the morning., Disp: , Rfl:   .  cholecalciferol, vitamin D3, 50 mcg (2,000 unit) tablet,chewable, Chew 2,000 Units in the morning., Disp: , Rfl:   .  PRENATAL 105-IRON-FOLIC AC-DHA ORAL, Take 1 tablet by mouth in the morning., Disp: , Rfl:   .  cetirizine (ZyrTEC) 10 mg tablet, Take 10 mg by mouth in the morning. 1 by mouth daily for allergies, Disp: , Rfl:        Allergies:    No Known  Allergies         ROS:    No palpitations, no tremor, no heat intolerance. No cold intolerance.  No diarrhea or constipation. No change in hair or skin. No dysphagia, no odynophagia, no solid food or pill dysphagia. No diplopia/blurry vision.No fevers/chills.No change in voice.No anterior neck pressure. No increase in neck size.No wheeze or DOE.No rash.No edema. No cough.No anxiety/Depression.Regular menses Rest of ROS negative or non-contributory.       Physical Exam:    There were no vitals taken for this visit.   ZOX:WRUEA alert no acute distress  HEENT: head atraumatic, normocephalic, no redness in eyes no discharge from nose  SKIN: facial skin does not show any cyanosis  RESP: speaking in full sentences, no increased respiratory effort seen  MSK: normal ROM in fingers  Neuro: facial nerves intact  Pysch: appearance, behavior and attitude-pleasant, cooperative and groomed        This real-time, interactive virtual Telehealth encounter was done by video  with the patient's verbal consent. Two patient identifiers were used and confirmed. Physical location of the patient: home  Patient resides in: [place]  Physical location of the provider: office  Other participants/involvement: none        This note has been created with voice recognition software.  While this note has been edited for accuracy, this software periodically misinterprets speech resulting in errors that might not have been caught in editing.  In the event an unusual error is found in this record, please notify us at  [phone] to resolve the error.

## 2022-04-21 ENCOUNTER — Ambulatory Visit: Admitting: Internal Medicine

## 2022-04-21 DIAGNOSIS — E282 Polycystic ovarian syndrome: Secondary | ICD-10-CM

## 2022-04-21 NOTE — Progress Notes (Signed)
[facility]  [address]  [place] [address]  Dept Phone: [phone]  Dept Fax: [phone]     Endocrinology Clinic Note    Referring Physician: Frances Furbish, MD    Dr Dorna Bloom    History of present illness:  Paige [name] is a 41 y.o. female G4P2 PMH of PCOS,HL here for f/u of GDM    12 months post partum  (girls).  Breast feeding right now, she is limited in what she can eat as the twins cant tolerate many foods.forgot to do labs    Prior to this I have seen her in 2019 with history of gestational diabetes.  Was on metformin in the first trimester, then gestational diabetes was diet controlled    41 y/o boy-7lb 1 ounce, was born in [place], no GDM then but was on metformin until 14 weeks and was on metformin even in this pregnancy until 14 weeks and was stopped by Dr Dorna Bloom    41 y/o boy- 6lb 12 ounces-GDM, diet controlled      PCOS Diagnosed 8 years ago based on oligomenorrhea, no acne/hirsuitism-clomiphene/IUI second preg first pregnancy she lost the bay at 9 weeks,this preg-no clomiphene but was on metformin. 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,married      Assessment/Plan:  Paige [name] is a 41 y.o. female G4P2 PMH of PCOS,HL here for f/u of GDM    H/o Gestational diabetes:85m post parturm with twins,will check a1c now along w/TSH    PCO S: Currently off of metformin .  Metformin will be reinitiated after she stops breast-feeding.Teso <40 last time we will update levels now.   discussed plant based diet,resources given ,This has benefited her but currently limited because of her twins have some food intolerances, menses are regular, LDL up again, breast feeding, strong family history of hypercholesterolemia.  Currently not on any meds,we will update lipid levels now.    She prefers video visits for now,follow-up in 6 months with labs    Thank you for involving in this patient's care. Please do not hesitate  to contact with questions or concerns         Rosina Lowenstein, MD         Pertinent lab review:    No results found for: HGBA1C   Lab Results   Component Value Date    GLUCOSE 66 (L) 05/08/2021      No results found for: LDLCALC   No results found for: HGBA1C  Lab Results   Component Value Date    GLUF 82 03/06/2021        No results found for: ALT, AST, GGT, ALKPHOS, BILITOT             Medical History:    Past Medical History:   Diagnosis Date   . Diabetes mellitus (CMS/HCC) 3017   . STD (sexually transmitted disease) 2005   . Tinea corporis 2006         Surgical History:    Past Surgical History:   Procedure Laterality Date   . D&C FIRST TRIMESTER / TX INCOMPLETE / MISSED / SEPTIC / INDUCED ABORTION N/A 2013    arcuate uterus          Family History:    @BIMFAMHISTORY @       Social History:    No valid surgical or medical questions entered.  Active medical problems:    Patient Active Problem List   Diagnosis   . Encounter for gynecological examination (general) (routine) without abnormal findings   . Eczema   . Allergy, unspecified, initial encounter   . Encounter for screening for malignant neoplasm of cervix   . Hyperlipidemia   . Twin pregnancy, unspecified number of placenta and unspecified number of amniotic sacs, unspecified trimester   . Vitamin D deficiency   . History of gestational diabetes   . Supervision of high risk pregnancy in second trimester   . [redacted] weeks gestation of pregnancy   . Supervision of elderly multigravida, third trimester   . Vesicular skin lesions   . Deviated nasal septum   . Post-nasal drip   . Obesity   . Melanocytic nevi, unspecified   . Polycystic ovarian syndrome   . Twin gestation with ninth pregnancy   . Gastroesophageal reflux disease   . Diet controlled gestational diabetes mellitus (GDM) in third trimester         Current medications:      Current Outpatient Medications:   .  CALCIUM CARBONATE ORAL, Take 1 tablet by mouth in the morning., Disp: , Rfl:   .   cholecalciferol, vitamin D3, 50 mcg (2,000 unit) tablet,chewable, Chew 2,000 Units in the morning., Disp: , Rfl:   .  PRENATAL 105-IRON-FOLIC AC-DHA ORAL, Take 1 tablet by mouth in the morning., Disp: , Rfl:        Allergies:    No Known Allergies         ROS:    No palpitations, no tremor, no heat intolerance. No cold intolerance.  No diarrhea or constipation. No change in hair or skin. No dysphagia, no odynophagia, no solid food or pill dysphagia. No diplopia/blurry vision.No fevers/chills.No change in voice.No anterior neck pressure. No increase in neck size.No wheeze or DOE.No rash.No edema. No cough.No anxiety/Depression.Regular menses Rest of ROS negative or non-contributory.       Physical Exam:    There were no vitals taken for this visit.   ZOX:WRUEA alert no acute distress  HEENT: head atraumatic, normocephalic, no redness in eyes no discharge from nose  SKIN: facial skin does not show any cyanosis  RESP: speaking in full sentences, no increased respiratory effort seen  MSK: normal ROM in fingers  Neuro: facial nerves intact  Pysch: appearance, behavior and attitude-pleasant, cooperative and groomed        This real-time, interactive virtual Telehealth encounter was done by video  with the patient's verbal consent. Two patient identifiers were used and confirmed. Physical location of the patient: home  Patient resides in: [place]  Physical location of the provider: office  Other participants/involvement: none        This note has been created with voice recognition software.  While this note has been edited for accuracy, this software periodically misinterprets speech resulting in errors that might not have been caught in editing.  In the event an unusual error is found in this record, please notify us at  [phone] to resolve the error.

## 2022-06-23 ENCOUNTER — Other Ambulatory Visit (INDEPENDENT_AMBULATORY_CARE_PROVIDER_SITE_OTHER): Admitting: Obstetrics & Gynecology

## 2022-06-23 ENCOUNTER — Encounter

## 2022-06-26 ENCOUNTER — Ambulatory Visit
Admit: 2022-06-26 | Discharge: 2022-06-26 | Payer: BLUE CROSS/BLUE SHIELD | Attending: Adult Health | Primary: Family Medicine

## 2022-06-26 ENCOUNTER — Encounter: Payer: BLUE CROSS/BLUE SHIELD | Attending: Adult Health | Primary: Family Medicine

## 2022-06-26 DIAGNOSIS — K219 Gastro-esophageal reflux disease without esophagitis: Secondary | ICD-10-CM

## 2022-06-26 NOTE — Patient Instructions (Addendum)
Lower leg seems like dermatofibroma make appointment with dermatology for breast and leg    Dr. Jake Bathe [phone]  [phone] breast ultrasound  try warm soaks 2-3 X daily warm to see what it does     Fu with Dr. Dorna Bloom

## 2022-06-26 NOTE — Assessment & Plan Note (Signed)
Call ID when she is done nursing twins to receive treatment

## 2022-06-26 NOTE — Assessment & Plan Note (Signed)
Was on Metformin followed by endocrionolgy

## 2022-06-26 NOTE — Assessment & Plan Note (Signed)
Raised circumscribed lesion, ? Dermatofibroma, refer to dermatology for evaluation, patient of Dr.kornbleuth

## 2022-06-26 NOTE — Assessment & Plan Note (Signed)
Due for physicial, will add on labs for next blood draw

## 2022-06-26 NOTE — Assessment & Plan Note (Signed)
Resolved after pregnancy,

## 2022-06-26 NOTE — Assessment & Plan Note (Signed)
Is nursing still, area on breast appears more intradermal than deep tissue, will order breast ultrasound, follow up with GYN next week as well, call me for results after she has testing

## 2022-06-26 NOTE — Progress Notes (Signed)
[facility]  [facility] [place]  [address]  [address]  [place] [address]  Dept: [phone]  Dept Fax: [phone]     Patient ID: Paige [name] is a 41 y.o. female who presents for Rash.    Subjective   41 y.o. woman  Is followed by GYN, twins 43 year old, Dr. 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 Charm Barges from ID in past, but did not start the antibiotic yet since she was nursing.      Presents today with c/o spot on breast X 4 weeks ago is breastfeeding , feels hard, not painful, no redness  and also noted left leg noticed with shaving, feels raised, a couple days ago    GYN Dr. Dorna Bloom, twins age 1,  8 and 6 children    Works at [employer] needs to follow up on finger as the twins are weaning then she will be able to take antibiotics.                Review of Systems   Constitutional: Negative.    HENT: Negative.    Respiratory: Negative.    Cardiovascular: Negative.    Gastrointestinal: Negative.    Skin: Positive for rash. Negative for itching.   Psychiatric/Behavioral: Negative.        Patient Active Problem List   Diagnosis   . Encounter for gynecological examination (general) (routine) without abnormal findings   . Eczema   . Allergy, unspecified, initial encounter   . Hyperlipidemia   . Vitamin D deficiency   . History of gestational diabetes   . Vesicular skin lesions   . Post-nasal drip   . Obesity   . Melanocytic nevi, unspecified   . Polycystic ovarian syndrome   . Gastroesophageal reflux disease   . Diet controlled gestational diabetes mellitus (GDM) in third trimester   . Mycobacteria, atypical   . Skin lesion of breast   . Skin lesion of left leg     Current Outpatient Medications   Medication Instructions   . CALCIUM CARBONATE ORAL 1 tablet, oral, Daily   . cholecalciferol (vitamin D3) 2,000 Units, oral, Daily RT   . COVID-19, Pfizer, bivalent 30 mcg/0.3 mL  vaccine No dose, route, or frequency recorded.   . Fluzone Quad [phone], PF, 60 mcg (15 mcg x 4)/0.5 mL vaccine No dose, route, or frequency recorded.   Marland Kitchen PRENATAL 105-IRON-FOLIC AC-DHA ORAL 1 tablet, oral, Daily     No Known Allergies  Past Medical History:   Diagnosis Date   . Diabetes mellitus (CMS/HCC) 3017   . STD (sexually transmitted disease) 2005   . Tinea corporis 2006     Past Surgical History:   Procedure Laterality Date   . D&C FIRST TRIMESTER / TX INCOMPLETE / MISSED / SEPTIC / INDUCED ABORTION N/A 2013    arcuate uterus     Family History   Problem Relation Name Age of Onset   . Atrial fibrillation Mother Mother 19   . Hyperlipidemia Mother Mother    . Arthritis Mother Mother    . Hyperlipidemia Father Father    . Nephrolithiasis Father Father    . Vision loss Father Father    . No Known Problems Sister     . Other (stomach issues) Sister     . Hyperlipidemia Brother     . Heart disease Neg Hx  No known family congenital heart disease or cardiac history.     Social History     Tobacco Use   . Smoking status: Never   . Smokeless tobacco: Never   Substance Use Topics   . Alcohol use: Not Currently   . Drug use: Not Currently     There are no discontinued medications.  Outpatient Medications Prior to Visit   Medication Sig Dispense Refill   . CALCIUM CARBONATE ORAL Take 1 tablet by mouth in the morning.     . cholecalciferol, vitamin D3, 50 mcg (2,000 unit) tablet,chewable Chew 2,000 Units in the morning.     Marland Kitchen PRENATAL 105-IRON-FOLIC AC-DHA ORAL Take 1 tablet by mouth in the morning.     Marland Kitchen COVID-19, Pfizer, bivalent 30 mcg/0.3 mL vaccine      . Fluzone Quad 2022-2023, PF, 60 mcg (15 mcg x 4)/0.5 mL vaccine          Objective   Visit Vitals  BP 102/62 (BP Location: Right arm, Patient Position: Sitting, BP Cuff Size: Adult)   Pulse 81   Ht 1.549 m   Wt 79.4 kg   SpO2 98%   BMI 33.07 kg/m   OB Status Recent pregnancy   BSA 1.85 m       Physical Exam  Constitutional:       Appearance: Normal  appearance.   Cardiovascular:      Rate and Rhythm: Normal rate and regular rhythm.      Pulses: Normal pulses.      Heart sounds: Normal heart sounds.   Pulmonary:      Effort: Pulmonary effort is normal.      Breath sounds: Normal breath sounds.   Chest:      Chest wall: No mass, lacerations, deformity, swelling, tenderness, crepitus or edema. There is no dullness to percussion.   Breasts:     Right: Normal. No swelling, bleeding, inverted nipple, mass, nipple discharge, skin change or tenderness.      Left: Normal. No swelling, bleeding, inverted nipple, mass, nipple discharge or tenderness.          Comments:  Left breast at approx 7 oclock, small round firm lesion, white in color seems intradermal, with palpation, non tender, no redness, no drainage, does not appear to be mastitis or clogged breast duct    Patient is still lactating  Lymphadenopathy:      Upper Body:      Right upper body: No supraclavicular, axillary or pectoral adenopathy.      Left upper body: No supraclavicular, axillary or pectoral adenopathy.   Skin:     Findings: Lesion present. Rash is not pustular, urticarial or vesicular.             Comments: Left lower leg with darker pigmented slightly raised lesion,  dermatofibroma   Neurological:      Mental Status: She is alert.         Assessment/Plan   Paige [name] was seen today for rash.  Gastroesophageal reflux disease without esophagitis  -     Comprehensive metabolic panel; Future  -     Comprehensive metabolic panel  Skin lesion of breast  Assessment & Plan:  Is nursing still, area on breast appears more intradermal than deep tissue, will order breast ultrasound, follow up with GYN next week as well, call me for results after she has testing  Orders:  -     Ambulatory referral to Dermatology; Future  -  BI [place] LEFT BREAST COMPLETE; Future  Mycobacteria, atypical  Assessment & Plan:  Call ID when she is done nursing twins to receive treatment  Hyperlipidemia, unspecified hyperlipidemia  type  Assessment & Plan:   Due for physicial, will add on labs for next blood draw  History of gestational diabetes  Assessment & Plan:  Was on Metformin followed by endocrionolgy  Skin lesion of left leg  Assessment & Plan:  Raised circumscribed lesion, ? Dermatofibroma, refer to dermatology for evaluation, patient of Dr.kornbleuth      Patient Health Questionnaire-2 Score: 0 Interpretation: Negative screening.      Follow-up & Interventions: Maintain annual screening - No additional Follow-up required

## 2022-07-01 ENCOUNTER — Encounter: Payer: BLUE CROSS/BLUE SHIELD | Attending: Adult Health | Primary: Family Medicine

## 2022-07-01 ENCOUNTER — Ambulatory Visit: Admit: 2022-07-01 | Payer: BLUE CROSS/BLUE SHIELD | Attending: Obstetrics & Gynecology | Primary: Family Medicine

## 2022-07-01 NOTE — Progress Notes (Addendum)
Patient was not seen

## 2022-07-02 ENCOUNTER — Ambulatory Visit
Admit: 2022-07-02 | Discharge: 2022-07-02 | Payer: BLUE CROSS/BLUE SHIELD | Attending: Obstetrics & Gynecology | Primary: Family Medicine

## 2022-07-02 DIAGNOSIS — Z01419 Encounter for gynecological examination (general) (routine) without abnormal findings: Secondary | ICD-10-CM

## 2022-07-02 MED ORDER — metFORMIN (Glucophage) 500 mg tablet
500 | ORAL_TABLET | Freq: Two times a day (BID) | ORAL | 11 refills | Status: AC
Start: 2022-07-02 — End: 2022-09-22

## 2022-07-02 NOTE — Progress Notes (Deleted)
Annual gynecological examination   MVT and cancer__  HT 5-1 G3P3   septghime     Down with breast feeding one week   Vasectomy     Breast bump - saw primary care doctor - will see der  onmetofrom for polycystic ovarian symdrome

## 2022-07-02 NOTE — Progress Notes (Signed)
[facility] [place]  [address] [place] [address]  [phone]    Patient ID: Paige [name] is a 41 y.o. female who presents for annual.    chaperone offered and declined  Primary care doctor:  Neg for Tobacco, Drugs, vape and occ ETOH, does SBE     LMP 06/18/22  Current medication:  Allergy  Medical history -  Surgical history -  Family history  -  Work     Mammogram   Last pap   06/17/21 wnl  Last HPV -06/17/21 neg  Any history of abnormal pap:    Marital status:   Lives:   Sexually active      Y   /Birth control -  HPV vaccine:  no  Emergency contact    myriad faminly history screening done on   plan: she does not meet current guidelines for this test. She declined this test today.     HPI        Review of Systems   Constitutional: Negative.    HENT: Negative.    Eyes: Negative.    Respiratory: Negative.    Cardiovascular: Negative.    Gastrointestinal: Negative.    Endocrine: Negative.    Genitourinary: Negative.    Musculoskeletal: Negative.    Skin: Negative.    Allergic/Immunologic: Negative.    Neurological: Negative.         Objective   Visit Vitals  LMP 06/18/2022   OB Status Recent pregnancy       Vitals 05/08/2021 05/09/2021 06/26/2021 10/21/2021 04/21/2022 06/26/2022 07/01/2022   Systolic 95 100 100 - - 102 104   Diastolic 58 68 62 - - 62 71   Pulse 74 70 79 - - 81 -   Temp 98.4 98.1 97.3 - - - -   Resp 16 16 - - - - -   Height (in) - - 61 61 61 61 61   Weight (lb) - - 154.8 150 160 175 173   SpO2 96 96 97 - - 98 -   BMI (kg/m2) - - 29.25 kg/m2 28.34 kg/m2 30.23 kg/m2 33.07 kg/m2 32.69 kg/m2   BSA (m2) - - 1.74 m2 1.71 m2 1.77 m2 1.85 m2 1.84 m2   VISIT REPORT - - 17NONCRENCREPNotFromCR   [id number]; - - 17NONCRENCREPNotFromCR   [id number]; -   Some recent data might be hidden       Physical Exam  Nursing note reviewed.   Constitutional:       Appearance: Normal appearance.   HENT:      Head: Normocephalic and atraumatic.      Right Ear:  External ear normal.      Left Ear: External ear normal.      Nose: Nose normal.   Cardiovascular:      Rate and Rhythm: Normal rate and regular rhythm.      Heart sounds: Normal heart sounds.   Pulmonary:      Effort: Pulmonary effort is normal.      Breath sounds: Normal breath sounds.   Chest:   Breasts:     Right: Normal.      Left: Normal.   Abdominal:      General: Bowel sounds are normal.      Palpations: Abdomen is soft.   Genitourinary:     General: Normal vulva.      Vagina: Normal.      Cervix: Normal.      Uterus: Normal.  Adnexa: Right adnexa normal and left adnexa normal.      Rectum: External hemorrhoid present.   Musculoskeletal:      Cervical back: Normal range of motion and neck supple.   Skin:     General: Skin is warm and dry.   Psychiatric:         Mood and Affect: Mood normal.         Assessment/Plan   Visit for gynecologic examination  -     BI BILATERAL MAMMOGRAM SCREENING TOMOSYNTHESIS; Future  -     Pap Smear with HPV DNA  PCOS (polycystic ovarian syndrome)  -     metFORMIN (Glucophage) 500 mg tablet; Take 1 tablet (500 mg) by mouth with breakfast and with evening meal.    The patient was counseled regarding regular monthly self-examinations of the breasts, regular sustained exercise for @ least 30 minutes 3-4 times per week, routine screening interval for mammogram, prevention of osteoporosis, importance of regular PAP smears, regular use of lap and shoulder seat belts.    Advice her to make an annual gynecological examination in one year before leaving office today    Patient was advised to check with her insurance company to sure that all tests ordered today are covered by her insurance company before heading for the tests.    birth control options for the prevention of an unintended pregnancy were reviewed    If she is smoking, advice her to quit tobacco

## 2022-07-04 LAB — PAP SMEAR AND HPV DNA
Cyto Comments: 0
HPV Aptima: NEGATIVE

## 2022-07-24 NOTE — Telephone Encounter (Signed)
The Central Referral Team has prepared this In-PHO Referral Order for you.  Your patient provided the information used to complete the details.  When you sign off it implies that you are in agreement that this referral is the appropriate clinical plan for this patient.    In order to attach a Referral Order to you we needed to open a Phone Note Encounter which also needs to be signed.  Please:  1. Review the Order for clinical appropriateness  2. Sign the Order  3. Sign off the Phone Note Encounter    Thanks- Central Referral Team

## 2022-07-28 ENCOUNTER — Ambulatory Visit
Admit: 2022-07-28 | Discharge: 2022-07-28 | Payer: BLUE CROSS/BLUE SHIELD | Attending: Pediatrics | Primary: Family Medicine

## 2022-07-28 ENCOUNTER — Other Ambulatory Visit: Admit: 2022-07-28 | Discharge: 2022-07-28 | Payer: BLUE CROSS/BLUE SHIELD | Primary: Family Medicine

## 2022-07-28 DIAGNOSIS — A311 Cutaneous mycobacterial infection: Secondary | ICD-10-CM

## 2022-07-28 DIAGNOSIS — E282 Polycystic ovarian syndrome: Secondary | ICD-10-CM

## 2022-07-28 LAB — COMPREHENSIVE METABOLIC PANEL
ALT: 21 U/L (ref 0–55)
AST: 24 U/L (ref 6–42)
Albumin: 4.7 g/dL (ref 3.2–5.0)
Alkaline phosphatase: 115 U/L (ref 30–130)
Anion Gap: 10 mmol/L (ref 3–14)
BUN: 12 mg/dL (ref 6–24)
Bilirubin, total: 0.4 mg/dL (ref 0.2–1.2)
CO2 (Bicarbonate): 28 mmol/L (ref 20–32)
Calcium: 10.1 mg/dL (ref 8.5–10.5)
Chloride: 100 mmol/L (ref 98–110)
Creatinine: 0.8 mg/dL (ref 0.55–1.30)
Glucose: 80 mg/dL (ref 70–139)
Potassium: 3.9 mmol/L (ref 3.6–5.2)
Protein, total: 8.3 g/dL (ref 6.0–8.4)
Sodium: 138 mmol/L (ref 135–146)
eGFRcr: 94 mL/min/{1.73_m2} (ref >=60)

## 2022-07-28 LAB — CBC WITH DIFFERENTIAL
Basophils %: 0.6 %
Basophils Absolute: 0.05 10*3/uL (ref 0.00–0.22)
Eosinophils %: 1.5 %
Eosinophils Absolute: 0.12 10*3/uL (ref 0.00–0.50)
Hematocrit: 45.9 % (ref 32.0–47.0)
Hemoglobin: 15.4 g/dL (ref 11.0–16.0)
Immature Granulocytes %: 0.4 %
Immature Granulocytes Absolute: 0.03 10*3/uL (ref 0.00–0.10)
Lymphocyte %: 35.3 %
Lymphocytes Absolute: 2.74 10*3/uL (ref 0.70–4.00)
MCH: 30 pg (ref 26.0–34.0)
MCHC: 33.6 g/dL (ref 31.0–37.0)
MCV: 89.3 fL (ref 80.0–100.0)
MPV: 9.3 fL (ref 9.1–12.4)
Monocytes %: 5.9 %
Monocytes Absolute: 0.46 10*3/uL (ref 0.36–0.77)
NRBC %: 0 % (ref 0.0–0.0)
NRBC Absolute: 0 10*3/uL (ref 0.00–2.00)
Neutrophil %: 56.3 %
Neutrophils Absolute: 4.36 10*3/uL (ref 1.50–7.95)
Platelets: 280 10*3/uL (ref 150–400)
RBC: 5.14 M/uL (ref 3.70–5.20)
RDW-CV: 12.4 % (ref 11.5–14.5)
RDW-SD: 40.6 fL (ref 35.0–51.0)
WBC: 7.8 10*3/uL (ref 4.0–11.0)

## 2022-07-28 LAB — HEMOGLOBIN A1C: HEMOGLOBIN A1C % (INT/EXT): 5.5 % (ref <5.6)

## 2022-07-28 LAB — TSH WITH REFLEX: TSH: 1.94 u[IU]/mL (ref 0.27–4.94)

## 2022-07-28 LAB — LIPID PANEL
Cholesterol/HDL Ratio: 6.3
Cholesterol: 310 mg/dL — ABNORMAL HIGH (ref <=200)
HDL cholesterol: 49 mg/dL (ref >=40)
LDL cholesterol, calculated: 194 mg/dL — ABNORMAL HIGH (ref 0–130)
Non-HDL Calculation: 261 mg/dL — ABNORMAL HIGH (ref <160)
Triglycerides: 335 mg/dL — ABNORMAL HIGH (ref <=150)

## 2022-07-28 LAB — TESTOSTERONE: Testosterone, total: 8 ng/dL — ABNORMAL LOW (ref 12–53)

## 2022-07-28 MED ORDER — azithromycin (Zithromax) 500 mg tablet
500 | ORAL_TABLET | Freq: Every day | ORAL | 0 refills | Status: DC
Start: 2022-07-28 — End: 2022-08-25

## 2022-07-28 MED ORDER — rifAMPin (Rifadin) 300 mg capsule
300 | ORAL_CAPSULE | Freq: Every day | ORAL | 0 refills | 30.00000 days | Status: DC
Start: 2022-07-28 — End: 2022-08-25

## 2022-07-28 NOTE — Progress Notes (Signed)
[facility] [place]  [address]  [place] [address]  Dept: [phone]    Language:   [place]    Reason for Consult  M. 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 She presents today for evaluation and treatment of the M. Marinum lesion.    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 She originally noticed a violaceous lesion on the dorsum of her right 4th finger around May 2021. An x-ray at the time showed a 3 mm metallic foreign body, which ended up being a very small metal splinter that spontaneously came out with removal of a scab. She underwent wound exploration and biopsy, but no additional foreign body was found and pathology reportedly showed chronic inflammation. She was treated with intermittent doxycycline with waxing/waning appearance of the lesion. In March 2022, she was seen by Dr. Geraldo Docker from [facility] who performed a biopsy that grew Mycobacterium marinum (I tetracyclines, otherwise S). She was seen by Dr. Margy Clarks from Infectious Diseases in May and October 2022, but treatment for the M. Marinum skin infection was deferred given that she was pregnant with twins and then breastfeeding two infants. The skin lesion improved somewhat during pregnancy, but flared again in late 2023, causing her to re-present to Dermatology in October 2023. On 07/10/22, she was seen by Dr. Jake Bathe and started on clarithromycin 500 mg PO BID.    Over the last ~2 weeks since starting clarithromycin, Paige [name] reports that the color of the lesion has lightened somewhat and  has not been painful. She has been taking clarithromycin twice daily as prescribed and noted some nausea, loose stools (up to 5x daily, not frank diarrhea), and mild headaches, but no severe symptoms. She has not had any fever, chills, vomiting, abdominal pain, new rashes, or progression of her current lesion. She has never had any lymphadenopathy or nodules extending up her arm.    Review of Systems   Pertinent positives and negatives as per HPI, otherwise a 10 point ROS was negative.    Medical History  Hyperlipidemia  Gestational diabetes  PCOS    Surgical History  Past Surgical History:   Procedure Laterality Date   . D&C FIRST TRIMESTER / TX INCOMPLETE / MISSED / SEPTIC / INDUCED ABORTION N/A 2013    arcuate uterus        Social History  Lives in [place] with her husband and 4 children. She works as a Garment/textile technologist at [employer]. No history of tobacco, alcohol, or substance use.    Family History  Not contributory to this consult.    Allergies  Patient has no known allergies.    Current Medications  Current Outpatient Medications   Medication Sig Dispense Refill   . CALCIUM CARBONATE ORAL Take 1 tablet by mouth in the morning.     . cholecalciferol, vitamin D3, 50 mcg (2,000 unit) tablet,chewable Chew 2,000 Units in the morning.     . multivitamin with folic acid 400 mcg tablet tablet Take 1 tablet by mouth once daily.     Marland Kitchen PRENATAL 105-IRON-FOLIC AC-DHA ORAL Take 1  tablet by mouth in the morning.     Marland Kitchen atorvastatin (Lipitor) 10 mg tablet Take 1 tablet (10 mg) by mouth once daily. To lower cholesterol 90 tablet 1   . azithromycin (Zithromax) 500 mg tablet Take 1 tablet (500 mg) by mouth once daily. 30 tablet 0   . metFORMIN (Glucophage) 500 mg tablet Take 1 tablet (500 mg) by mouth with breakfast and with evening meal. (Patient not taking: Reported on 07/28/2022) 60 tablet 11   . rifAMPin (Rifadin) 300 mg capsule Take 2 capsules (600 mg) by mouth once daily. 60 capsule 0     No current facility-administered  medications for this visit.       Physical Exam:  Vitals: Blood pressure 111/74, pulse 79, temperature 36.3 C (97.4 F), temperature source Temporal, resp. rate 18, height 1.56 m, weight 79.5 kg, last menstrual period 06/18/2022, SpO2 97%, not currently breastfeeding.   Gen: Well-appearing woman, alert and conversant.   HEENT: PERRL, EOMI, no scleral icterus. MMM, no oral lesions.  Chest: CTAB.  Cardiac: RRR, nl S1 S2, no murmurs.  Abdomen: Soft, non-tender, non-distended, no masses or organomegaly, + bowel sounds.  Neuro: A&O x 3, appropriately interactive, normal gait, moving all extremities.  Upper Extremities: R 4th finger with a violaceous plaque over the dorsal surface of the PIP joint and proximal phalanx. No subcutaneous nodules. No axillary lymphadenopathy.  Lower Extremities: wwp, no edema   Skin: No rashes or lesions apart from the right 4th finger lesion.    07/28/22:      07/10/22 ([facility]):        Labs  Lab Results   Component Value Date    WBC 7.8 07/28/2022    HGB 15.4 07/28/2022    MCV 89.3 07/28/2022    PLT 280 07/28/2022          Chemistry    Lab Results   Component Value Date/Time    NA 138 07/28/2022 1035    K 3.9 07/28/2022 1035    CL 100 07/28/2022 1035    CO2 28 07/28/2022 1035    BUN 12 07/28/2022 1035    CREATININE 0.80 07/28/2022 1035    Lab Results   Component Value Date/Time    CALCIUM 10.1 07/28/2022 1035    ALKPHOS 115 07/28/2022 1035    AST 24 07/28/2022 1035    ALT 21 07/28/2022 1035    BILITOT 0.4 07/28/2022 1035        Microbiology  Wound:  11/29/20 R 4th finger wound biopsy: Culture grew Mycobacterium marinum.      Pathology  11/29/20 R 4th finger punch biopsy:  A. RIGHT DORSAL 4TH FINGER, PUNCH BS   DENSE, WELL-FORMED DERMAL GRANULOMATOUS DERMATITIS (SEE COMMENT).   COMMENT: The findings are concerning for an infectious etiology. However, no fungal   organisms are identified with repeat PAS stains and repeat gram and AFB stains are negative   for pathogenic bacterial organisms.  Tissue culture is recommended. Multiple levels examined.        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 She presents today for evaluation and treatment of the M. Marinum lesion.    Paige [name] right 4th finger lesion already appears to be improving slightly since starting clarithromycin monotherapy. We discussed today that for very minor skin lesions caused by M.  Marinum, monotherapy may be effective, but that there is stronger evidence in the literature for treating with two agents and likely higher rate of cure. I recommended a trial of  rifampin and azithromycin, given that RIF significantly decreases the serum concentrations of clarithromycin. We discussed that these infections are slow to grow and slow to resolve, so that I anticipate that she may need 3-6 months total of therapy, continuing antimicrobials until 1-2 months after resolution of the lesion. We discussed potential side effects of azithromycin and rifampin, including headache, nausea, vomiting, abdominal pain, and hepatitis. Her baseline renal/hepatic function and blood counts were checked and normal today. We made an initial plan to start azithromycin on 07/30/22, followed by rifampin several days later if she feels well. I will follow-up with her by phone in 2 weeks and plan to see her back in clinic in 1 month with repeat labs. I asked Paige [name] to STOP antibiotics and call me if she develops any concerning symptoms.    Regarding her hyperlipidemia, I reached out to her Endocrinologist Dr. Tedra Senegal and PCP Rolm Bookbinder NP to let them know that rifampin decreases the serum concentrations of statins, which can be mitigated somewhat by taking the statin at the same time as rifampin. However, she may need to be on a higher dose of statin  than anticipated while on rifampin.    Diagnostics:  - CBC/d and CMP today were normal; will repeat labs in 1 month or sooner as needed  - EKG today showed a QTc of 446    Therapeutics:  - Stop clarithromycin  - Start azithromycin 500 mg PO daily  - If tolerating well, start rifampin 600 mg PO daily taken first thing in the morning on an empty stomach  - Recommend holding off on starting metformin for at least 1-2 weeks to ensure that she is tolerating this regimen    MIPS  Last hemoglobin A1c: 5.3% in 10/15/20  - Does patient had a history of hypertension: no  - Does patient have a history of diabetes: no     Follow Up Plan  - f/u by phone on 08/12/22 at 12:30 PM  - In person f/u visit on 08/25/22 at 11:30 AM    I discussed these recommendations with PCP Rolm Bookbinder and dermatologist Dr. Jake Bathe.    [place]. Jerrin Recore, M.D.  Infectious Diseases Attending

## 2022-07-28 NOTE — Patient Instructions (Addendum)
Today we checked your blood counts, your kidney function and electrolytes, and your liver function. I will call you when I see those results (likely by Tuesday at the latest). I also checked an EKG to make sure that your heart rhythm and intervals (called the QTc) is normal.    Provided that your labs are normal, we will plan to stop clarithromycin and start two new antibiotics (azithromycin and rifampin). A provisional plan is below:  - Stop clarithromycin  - Start azithromycin 500 mg (1 tablet) daily. Take this first thing in the morning.  - As long as you feel well, 3-4 days after starting azithromycin, plan to start rifampin 600 mg (2 tablets) taken first thing in the morning, with azithromycin. Ideally, take the medications on an empty stomach and wait 30-60 minutes before eating.    These antibiotics can cause some nausea and loose stools. Rarely, rifampin can cause headache, a flu-like syndrome, inflammation of the liver, and decreased platelet counts. This is why we will check your labs monthly to ensure that you are tolerating the antibiotics well. Please STOP your antibiotics and call me if you develop severe nausea, diarrhea, abdominal pain, yellowing of eyes/skin, fever, chills, or any other concerning symptoms.    Follow-up: Scheduled with [facility] 08[facility] 11[facility]30 [facility]

## 2022-07-29 MED ORDER — atorvastatin (Lipitor) 10 mg tablet
10 | ORAL_TABLET | Freq: Every day | ORAL | 1 refills | 90.00000 days | Status: DC
Start: 2022-07-29 — End: 2022-10-24

## 2022-07-29 NOTE — Telephone Encounter (Signed)
Pt would like a call bk about her cholesterol.

## 2022-07-29 NOTE — Telephone Encounter (Signed)
Discussed hyperlipidemia, will start atorvatatin 10mg  daily, repeat labs in 3 months, aware may need to increase dosage while on Rifampin,

## 2022-08-25 ENCOUNTER — Ambulatory Visit
Admit: 2022-08-25 | Discharge: 2022-08-25 | Payer: BLUE CROSS/BLUE SHIELD | Attending: Pediatrics | Primary: Family Medicine

## 2022-08-25 ENCOUNTER — Ambulatory Visit: Admit: 2022-08-25 | Discharge: 2022-08-25 | Payer: BLUE CROSS/BLUE SHIELD | Primary: Family Medicine

## 2022-08-25 DIAGNOSIS — A311 Cutaneous mycobacterial infection: Secondary | ICD-10-CM

## 2022-08-25 LAB — CBC WITH DIFFERENTIAL
Basophils %: 0.4 %
Basophils Absolute: 0.03 10*3/uL (ref 0.00–0.22)
Eosinophils %: 2.2 %
Eosinophils Absolute: 0.18 10*3/uL (ref 0.00–0.50)
Hematocrit: 43.3 % (ref 32.0–47.0)
Hemoglobin: 14.8 g/dL (ref 11.0–16.0)
Immature Granulocytes %: 0.2 %
Immature Granulocytes Absolute: 0.02 10*3/uL (ref 0.00–0.10)
Lymphocyte %: 33.8 %
Lymphocytes Absolute: 2.77 10*3/uL (ref 0.70–4.00)
MCH: 30.1 pg (ref 26.0–34.0)
MCHC: 34.2 g/dL (ref 31.0–37.0)
MCV: 88 fL (ref 80.0–100.0)
MPV: 9.4 fL (ref 9.1–12.4)
Monocytes %: 6.2 %
Monocytes Absolute: 0.51 10*3/uL (ref 0.36–0.77)
NRBC %: 0 % (ref 0.0–0.0)
NRBC Absolute: 0 10*3/uL (ref 0.00–2.00)
Neutrophil %: 57.2 %
Neutrophils Absolute: 4.68 10*3/uL (ref 1.50–7.95)
Platelets: 239 10*3/uL (ref 150–400)
RBC: 4.92 M/uL (ref 3.70–5.20)
RDW-CV: 12.6 % (ref 11.5–14.5)
RDW-SD: 40.4 fL (ref 35.0–51.0)
WBC: 8.2 10*3/uL (ref 4.0–11.0)

## 2022-08-25 MED ORDER — azithromycin (Zithromax) 500 mg tablet
500 | ORAL_TABLET | Freq: Every day | ORAL | 0 refills | Status: DC
Start: 2022-08-25 — End: 2022-09-22

## 2022-08-25 MED ORDER — rifAMPin (Rifadin) 300 mg capsule
300 | ORAL_CAPSULE | Freq: Every day | ORAL | 0 refills | 30.00000 days | Status: DC
Start: 2022-08-25 — End: 2022-09-22

## 2022-08-25 NOTE — Progress Notes (Signed)
[facility] [place]  [address]  [place] [address]  Dept: [phone]    Reason for Visit  M. 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 She presents today for follow-up of treatment of the M. Marinum lesion after completing about 6 weeks total of therapy.    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 She originally noticed a violaceous lesion on the dorsum of her right 4th finger around May 2021. An x-ray at the time showed a 3 mm metallic foreign body, which ended up being a very small metal splinter that spontaneously came out with removal of a scab. She underwent wound exploration and biopsy, but no additional foreign body was found and pathology reportedly showed chronic inflammation. She was treated with intermittent doxycycline with waxing/waning appearance of the lesion. In March 2022, she was seen by Dr. 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 She was seen by [name] from Infectious Diseases in May and October 2022, but treatment for the M. Marinum skin infection was deferred given that she was pregnant with twins and then breastfeeding two infants. The skin lesion improved somewhat during pregnancy, but flared again in late 2023, causing her to re-present to Dermatology in October 2023. On 07/10/22, she was seen by [name] and started on clarithromycin 500 mg PO BID. She had some improvement on clarithromycin  monotherapy with lightening of the lesion. On 07/28/22, I saw her in ID clinic and recommended switching to dual therapy with azithromycin and rifampin given higher chance of cure than with monotherapy. Her initial labs on 07/28/22 were normal with Cr 0.8, normal LFTs (AST 24, ALT 21, ALP 115, Tbili 0.4), WBC 7.8, hgb 15.4, platelets 280. An EKG showed a QTc of 446.    Interval History  Over the last month since starting azithromycin and rifampin, Paige [name] reports that she has been feeling well. She reports a normal energy level (somewhat chronically fatigued from having several young children) and normal appetite. She has occasional loose stools, but reports that the diarrhea that she experienced with clarithromycin resolved after stopping that medication. She has not had any fever, chills, headache, vomiting, abdominal pain, or new rashes. She notes that the lesion on her right 4th finger appears to be less raised and indurated, as well as slightly lower in color. She has not noticed any progression of the lesion and has not had any pain in the joints of her hand.    Since her last visit, [facility]ige [name] started atorvastatin 10 mg nightly. Of note, she has been taking her rifampin 300 mg twice daily (on an empty stomach in the AM and at night before bed). She has been taking the atorvastatin with her nightly dose of rifampin.     Review of Systems   Pertinent positives and negatives as per HPI, otherwise a 10 point ROS was negative.    Medical History  Hyperlipidemia  Gestational diabetes (A1c 5.5%)  PCOS    Surgical History  Past  Surgical History:   Procedure Laterality Date   . 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 She works as a Garment/textile technologist at [employer]. No history of tobacco, alcohol, or substance use.    Family History  Not contributory to this consult.    Allergies  Patient has no  known allergies.    Current Medications  Current Outpatient Medications   Medication Sig Dispense Refill   . atorvastatin (Lipitor) 10 mg tablet Take 1 tablet (10 mg) by mouth once daily. To lower cholesterol 90 tablet 1   . azithromycin (Zithromax) 500 mg tablet Take 1 tablet (500 mg) by mouth once daily. 30 tablet 0   . CALCIUM CARBONATE ORAL Take 1 tablet by mouth in the morning.     . cholecalciferol, vitamin D3, 50 mcg (2,000 unit) tablet,chewable Chew 2,000 Units in the morning.     . metFORMIN (Glucophage) 500 mg tablet Take 1 tablet (500 mg) by mouth with breakfast and with evening meal. (Patient not taking: Reported on 07/28/2022) 60 tablet 11   . multivitamin with folic acid 400 mcg tablet tablet Take 1 tablet by mouth once daily.     Marland Kitchen PRENATAL 105-IRON-FOLIC AC-DHA ORAL Take 1 tablet by mouth in the morning.     . rifAMPin (Rifadin) 300 mg capsule Take 2 capsules (600 mg) by mouth once daily. 60 capsule 0     No current facility-administered medications for this visit.       Physical Exam:  Vitals: Blood pressure 112/72, pulse 80, weight 80.3 kg, not currently breastfeeding.   Gen: Well-appearing woman, alert and conversant.   HEENT: EOMI, no scleral icterus.  Chest: CTAB.  Cardiac: RRR, nl S1 S2, no murmurs.  Abdomen: Soft, non-tender, non-distended.  Neuro: A&O x 3, appropriately interactive, normal gait, moving all extremities.  Upper Extremities: R 4th finger with a violaceous plaque over the dorsal surface of the PIP joint and proximal phalanx, which appears less indurated and lighter in color than 1 month ago. A satellite lesion on the distal aspect of her PIP joint appears nearly resolved. There are no subcutaneous nodules or lymphadenopathy.  Skin: No rashes or lesions apart from the right 4th finger lesion.    08/25/22:          07/28/22:      07/10/22 ([facility]):        Labs  Lab Results   Component Value Date    WBC 8.2 08/25/2022    HGB 14.8 08/25/2022    MCV 88.0 08/25/2022    PLT 239  08/25/2022          Chemistry    Lab Results   Component Value Date/Time    NA 140 08/25/2022 1246    K 3.6 08/25/2022 1246    CL 103 08/25/2022 1246    CO2 25 08/25/2022 1246    BUN 16 08/25/2022 1246    CREATININE 0.70 08/25/2022 1246    Lab Results   Component Value Date/Time    CALCIUM 9.7 08/25/2022 1246    ALKPHOS 96 08/25/2022 1246    AST 23 08/25/2022 1246    ALT 22 08/25/2022 1246    BILITOT 0.3 08/25/2022 1246        Microbiology  Wound:  11/29/20 R 4th finger wound biopsy: Culture grew Mycobacterium  marinum.      Pathology  11/29/20 R 4th finger punch biopsy:  A. RIGHT DORSAL 4TH FINGER, PUNCH BS   DENSE, WELL-FORMED DERMAL GRANULOMATOUS DERMATITIS (SEE COMMENT).   COMMENT: The findings are concerning for an infectious etiology. However, no fungal   organisms are identified with repeat PAS stains and repeat gram and AFB stains are negative   for pathogenic bacterial organisms. Tissue culture is recommended. Multiple levels examined.        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 She presents today for follow-up of treatment of the M. Marinum lesion after completing about 6 weeks total of therapy.    Ms. Oliveria right 4th finger lesion appears to be improving after 6 weeks of therapy, with decrease in the degree of inflammation/induration of the main lesion, as well as resolution of a very small satellite lesion on the distal side of her PIP joint. She is tolerating rifampin and azithromycin without any significant side effects. Monitoring labs today (CBC/d, CMP) were normal. We again discussed that these infections are slow to grow and slow to resolve, so that I anticipate that she may need closer to 6 months total of therapy. We will continue  antimicrobials until 1-2 months after complete resolution of the lesion.    I asked Ms. Masek to try taking both tablets of rifampin together first thing in the morning on an empty stomach with her azithromycin and atorvastatin, to maximize absorption of the rifampin and minimize drug interactions with atorvastatin. I asked her to STOP antibiotics and call me if she develops any concerning symptoms.    Diagnostics:  - CBC/d and CMP today were normal; will repeat labs in 1 month  - EKG on 07/28/22 showed a QTc of 446  - Will draw repeat fasting lipid panel in February 2024    Therapeutics:  - Continue azithromycin 500 mg PO daily  - Continue rifampin 600 mg PO daily taken first thing in the morning on an empty stomach  - Recommend taking atorvastatin at the same time as rifampin to minimize drug-drug interactions    MIPS  Last hemoglobin A1c: 5.5%  - Does patient had a history of hypertension: no  - Does patient have a history of diabetes: no     Follow Up Plan  - In person visit with me on 09/22/2022 at noon    [facility]. Donoven Pett, M.D.  Infectious Diseases Attending    CC: Frances Furbish MD, Rolm Bookbinder NP, Bernerd Pho MD

## 2022-08-26 LAB — COMPREHENSIVE METABOLIC PANEL
ALT: 22 U/L (ref 0–55)
AST: 23 U/L (ref 6–42)
Albumin: 4.4 g/dL (ref 3.2–5.0)
Alkaline phosphatase: 96 U/L (ref 30–130)
Anion Gap: 12 mmol/L (ref 3–14)
BUN: 16 mg/dL (ref 6–24)
Bilirubin, total: 0.3 mg/dL (ref 0.2–1.2)
CO2 (Bicarbonate): 25 mmol/L (ref 20–32)
Calcium: 9.7 mg/dL (ref 8.5–10.5)
Chloride: 103 mmol/L (ref 98–110)
Creatinine: 0.7 mg/dL (ref 0.55–1.30)
Glucose: 69 mg/dL — ABNORMAL LOW (ref 70–139)
Potassium: 3.6 mmol/L (ref 3.6–5.2)
Protein, total: 7.7 g/dL (ref 6.0–8.4)
Sodium: 140 mmol/L (ref 135–146)
eGFRcr: 110 mL/min/{1.73_m2} (ref >=60)

## 2022-08-26 NOTE — Telephone Encounter (Signed)
From: Prudencio PairKimberly D Grismore  To: Dr. Ria Bushachel Erdil, MD  Sent: 08/25/2022 9:32 PM EST  Subject: Medication instructions     Hi Dr Eugene GarnetErdil, I wanted to let you know that the instructions does say take 2 pills once daily I just mixed it up whoops

## 2022-09-02 ENCOUNTER — Ambulatory Visit: Payer: BLUE CROSS/BLUE SHIELD | Primary: Family Medicine

## 2022-09-17 ENCOUNTER — Ambulatory Visit: Payer: BLUE CROSS/BLUE SHIELD | Primary: Family Medicine

## 2022-09-22 ENCOUNTER — Ambulatory Visit
Admit: 2022-09-22 | Discharge: 2022-09-22 | Payer: BLUE CROSS/BLUE SHIELD | Attending: Pediatrics | Primary: Family Medicine

## 2022-09-22 ENCOUNTER — Ambulatory Visit: Admit: 2022-09-22 | Discharge: 2022-09-22 | Payer: BLUE CROSS/BLUE SHIELD | Primary: Family Medicine

## 2022-09-22 DIAGNOSIS — A311 Cutaneous mycobacterial infection: Secondary | ICD-10-CM

## 2022-09-22 LAB — COMPREHENSIVE METABOLIC PANEL
ALT: 23 U/L (ref 0–55)
AST: 27 U/L (ref 6–42)
Albumin: 4.4 g/dL (ref 3.2–5.0)
Alkaline phosphatase: 95 U/L (ref 30–130)
Anion Gap: 10 mmol/L (ref 3–14)
BUN: 13 mg/dL (ref 6–24)
Bilirubin, total: 0.4 mg/dL (ref 0.2–1.2)
CO2 (Bicarbonate): 26 mmol/L (ref 20–32)
Calcium: 9.5 mg/dL (ref 8.5–10.5)
Chloride: 104 mmol/L (ref 98–110)
Creatinine: 0.68 mg/dL (ref 0.55–1.30)
Glucose: 105 mg/dL (ref 70–139)
Potassium: 3.4 mmol/L — ABNORMAL LOW (ref 3.6–5.2)
Protein, total: 7.7 g/dL (ref 6.0–8.4)
Sodium: 140 mmol/L (ref 135–146)
eGFRcr: 111 mL/min/{1.73_m2} (ref >=60)

## 2022-09-22 LAB — CBC WITH DIFFERENTIAL
Basophils %: 0.4 %
Basophils Absolute: 0.03 10*3/uL (ref 0.00–0.22)
Eosinophils %: 1.4 %
Eosinophils Absolute: 0.11 10*3/uL (ref 0.00–0.50)
Hematocrit: 44.3 % (ref 32.0–47.0)
Hemoglobin: 15.1 g/dL (ref 11.0–16.0)
Immature Granulocytes %: 0.1 %
Immature Granulocytes Absolute: 0.01 10*3/uL (ref 0.00–0.10)
Lymphocyte %: 34.1 %
Lymphocytes Absolute: 2.7 10*3/uL (ref 0.70–4.00)
MCH: 30 pg (ref 26.0–34.0)
MCHC: 34.1 g/dL (ref 31.0–37.0)
MCV: 87.9 fL (ref 80.0–100.0)
MPV: 9.5 fL (ref 9.1–12.4)
Monocytes %: 5.9 %
Monocytes Absolute: 0.47 10*3/uL (ref 0.36–0.77)
NRBC %: 0 % (ref 0.0–0.0)
NRBC Absolute: 0 10*3/uL (ref 0.00–2.00)
Neutrophil %: 58.1 %
Neutrophils Absolute: 4.6 10*3/uL (ref 1.50–7.95)
Platelets: 227 10*3/uL (ref 150–400)
RBC: 5.04 M/uL (ref 3.70–5.20)
RDW-CV: 12.9 % (ref 11.5–14.5)
RDW-SD: 41.3 fL (ref 35.0–51.0)
WBC: 7.9 10*3/uL (ref 4.0–11.0)

## 2022-09-22 MED ORDER — azithromycin (Zithromax) 500 mg tablet
500 | ORAL_TABLET | Freq: Every day | ORAL | 0 refills | Status: DC
Start: 2022-09-22 — End: 2022-10-20

## 2022-09-22 MED ORDER — rifAMPin (Rifadin) 300 mg capsule
300 | ORAL_CAPSULE | Freq: Every day | ORAL | 0 refills | 30.00000 days | Status: DC
Start: 2022-09-22 — End: 2022-10-20

## 2022-09-22 NOTE — Progress Notes (Signed)
[name]  [address]  [place] [address]  Dept: [phone]    Reason for Visit  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 infection of the right 4th finger for which she was started on clarithromycin monotherapy (07/10/22-07/28/22), then was switched to dual therapy with rifampin and azithromycin starting 07/29/22. She presents today for follow-up of treatment of the [name] lesion after completing about 10 weeks total of therapy.    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 She originally noticed a violaceous lesion on the dorsum of her right 4th finger around May 2021. An x-ray at the time showed a 3 mm metallic foreign body, which ended up being a very small metal splinter that spontaneously came out with removal of a scab. She underwent wound exploration and biopsy, but no additional foreign body was found and pathology reportedly showed chronic inflammation. She was treated with intermittent doxycycline with waxing/waning appearance of the lesion. In March 2022, she was seen by Dr. Geraldo [name] from [name] who performed a biopsy that grew Mycobacterium [name] (I tetracyclines, otherwise S). She was seen by Dr. Margy [name] from [name] in May and October 2022, but treatment for the [name] skin infection was deferred given that she was pregnant with twins and then breastfeeding two infants. The skin lesion improved somewhat during pregnancy, but flared again in late 2023, causing her to re-present to [name] in October 2023. On 07/10/22, she was seen by Dr. Jake [name] and started on clarithromycin 500 mg PO BID. She had some improvement on  clarithromycin monotherapy with lightening of the lesion. On 07/28/22, I saw her in ID clinic and recommended switching to dual therapy with azithromycin and rifampin given higher chance of cure than with monotherapy. Her initial labs on 07/28/22 were normal with Cr 0.8, normal LFTs (AST 24, ALT 21, ALP 115, Tbili 0.4), WBC 7.8, hgb 15.4, platelets 280. An EKG showed a QTc of 446.    Interval History  Over the last two months since starting azithromycin and rifampin, Paige [name] reports that she has been feeling well without any obvious side effects. She did develop significant diarrhea when starting metformin, which resolved when she stopped that medication. She and her entire family had COVID over the holidays, but she reports recovering completely. Since recovering from COVID, she has not had any fever, chills, headache, vomiting, abdominal pain, or new rashes. She continues to feel that the lesion on her right 4th finger is becoming more flat and less indurated/erythematous.    Since her last visit, Paige [name] has been taking rifampin, azithromycin, and atorvastatin together in the morning on an empty stomach.    Review of Systems   Pertinent positives and negatives as per HPI, otherwise a 10 point ROS was negative.    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 She works as a Veterinary surgeon at [name]. No history of tobacco, alcohol, or substance use.    Family History  Not contributory to this consult.    Allergies  Patient has no known allergies.    Current Medications  Current Outpatient Medications   Medication Sig Dispense Refill   . atorvastatin (Lipitor) 10 mg tablet Take 1 tablet  (10 mg) by mouth once daily. To lower cholesterol 90 tablet 1   . CALCIUM CARBONATE ORAL Take 1 tablet by mouth in the morning.     . cholecalciferol, vitamin D3, 50 mcg (2,000 unit) tablet,chewable Chew 2,000 Units in the morning.     . metFORMIN (Glucophage) 500 mg tablet Take 1 tablet (500 mg) by mouth with breakfast and with evening meal. 60 tablet 11   . multivitamin with folic acid 606 mcg tablet tablet Take 1 tablet by mouth once daily.     . multivitamin with folic acid 301 mcg tablet tablet Take 1 tablet by mouth once daily.     . mupirocin (Bactroban) 2 % ointment SMARTSIG:1 Application Topical 2-3 Times Daily     . azithromycin (Zithromax) 500 mg tablet Take 1 tablet (500 mg) by mouth once daily. 30 tablet 0   . PRENATAL 105-IRON-FOLIC AC-DHA ORAL Take 1 tablet by mouth in the morning. (Patient not taking: Reported on 09/22/2022)     . rifAMPin (Rifadin) 300 mg capsule Take 2 capsules (600 mg) by mouth once daily. 60 capsule 0     No current facility-administered medications for this visit.       Physical Exam:  Vitals: Blood pressure 104/65, pulse 81, temperature 35.9 C (96.6 F), temperature source Temporal, resp. rate 18, weight 81 kg, SpO2 98%, not currently breastfeeding.   Gen: Well-appearing woman, alert and conversant.   HEENT: EOMI, no scleral icterus.  Chest: CTAB.  Cardiac: RRR, nl S1 S2, no murmurs.  Abdomen: Soft, non-tender, non-distended.  Neuro: A&O x 3, appropriately interactive, normal gait, moving all extremities.  Upper Extremities: R 4th finger with an improving violaceous plaque over the dorsal surface of the PIP joint and proximal phalanx, which appears less indurated and lighter in color than 1 month ago. A satellite lesion on the distal aspect of her PIP joint is resolved. There are no subcutaneous nodules or lymphadenopathy.  Skin: No rashes or lesions apart from the right 4th finger lesion.          08/25/22:          07/28/22:      07/10/22 ([facility]):        Labs  Lab Results    Component Value Date    WBC 7.9 09/22/2022    HGB 15.1 09/22/2022    MCV 87.9 09/22/2022    PLT 227 09/22/2022          Chemistry    Lab Results   Component Value Date/Time    NA 140 09/22/2022 1237    K 3.4 (L) 09/22/2022 1237    CL 104 09/22/2022 1237    CO2 26 09/22/2022 1237    BUN 13 09/22/2022 1237    CREATININE 0.68 09/22/2022 1237    Lab Results   Component Value Date/Time    CALCIUM 9.5 09/22/2022 1237    ALKPHOS 95 09/22/2022 1237    AST 27 09/22/2022 1237    ALT 23 09/22/2022 1237    BILITOT 0.4 09/22/2022 1237  Microbiology  Wound:  11/29/20 R 4th finger wound biopsy: Culture grew Mycobacterium [name].      Pathology  11/29/20 R 4th finger punch biopsy:  A. RIGHT DORSAL 4TH FINGER, PUNCH BS   DENSE, WELL-FORMED DERMAL GRANULOMATOUS DERMATITIS (SEE COMMENT).   COMMENT: The findings are concerning for an Paige etiology. However, no fungal   organisms are identified with repeat PAS stains and repeat gram and AFB stains are negative   for pathogenic bacterial organisms. Tissue culture is recommended. Multiple levels examined.        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 infection of the right 4th finger for which she was started on clarithromycin monotherapy (07/10/22-07/28/22), then was switched to dual therapy with rifampin and azithromycin starting 07/29/22. She presents today for follow-up of treatment of the [name] lesion after completing about 10 weeks total of therapy.    Paige [name] right 4th finger lesion appears to be slowly improving on her current regimen, with a further decrease in the induration and erythema of the main lesion and resolution of the more distal satellite lesion in the last month. She continues to tolerate rifampin and azithromycin without any significant side effects and monitoring labs today were normal. Based on her slow, but gradual improvement, I anticipate that she will need closer to 6  months total of therapy. We will continue antimicrobials until 1-2 months after complete resolution of the lesion.     Diagnostics:  - CBC/d and CMP today were normal; will repeat labs in 1 month  - EKG on 07/28/22 showed a QTc of 446  - Will draw repeat fasting lipid panel in February 2024    Therapeutics:  - Continue azithromycin 500 mg PO daily  - Continue rifampin 600 mg PO daily taken first thing in the morning on an empty stomach  - Recommend taking atorvastatin at the same time as rifampin to minimize drug-drug interactions    MIPS  Last hemoglobin A1c: 5.5%  - Does patient had a history of hypertension: no  - Does patient have a history of diabetes: no     Follow Up Plan  - In person visit with me on 10/20/22    [facility]. Cheyene Hamric, M.D.  [name] Attending

## 2022-09-30 ENCOUNTER — Inpatient Hospital Stay: Admit: 2022-09-30 | Payer: BLUE CROSS/BLUE SHIELD | Primary: Family Medicine

## 2022-09-30 DIAGNOSIS — Z01419 Encounter for gynecological examination (general) (routine) without abnormal findings: Secondary | ICD-10-CM

## 2022-09-30 DIAGNOSIS — Z1231 Encounter for screening mammogram for malignant neoplasm of breast: Secondary | ICD-10-CM

## 2022-10-20 ENCOUNTER — Ambulatory Visit
Admit: 2022-10-20 | Discharge: 2022-10-20 | Payer: BLUE CROSS/BLUE SHIELD | Attending: Pediatrics | Primary: Family Medicine

## 2022-10-20 ENCOUNTER — Ambulatory Visit: Admit: 2022-10-20 | Discharge: 2022-10-20 | Payer: BLUE CROSS/BLUE SHIELD | Primary: Family Medicine

## 2022-10-20 DIAGNOSIS — A311 Cutaneous mycobacterial infection: Secondary | ICD-10-CM

## 2022-10-20 LAB — CBC WITH DIFFERENTIAL
Basophils %: 0.4 %
Basophils Absolute: 0.02 10*3/uL (ref 0.00–0.22)
Eosinophils %: 1.3 %
Eosinophils Absolute: 0.07 10*3/uL (ref 0.00–0.50)
Hematocrit: 41.8 % (ref 32.0–47.0)
Hemoglobin: 14.5 g/dL (ref 11.0–16.0)
Immature Granulocytes %: 0.2 %
Immature Granulocytes Absolute: 0.01 10*3/uL (ref 0.00–0.10)
Lymphocyte %: 40.9 %
Lymphocytes Absolute: 2.21 10*3/uL (ref 0.70–4.00)
MCH: 30 pg (ref 26.0–34.0)
MCHC: 34.7 g/dL (ref 31.0–37.0)
MCV: 86.4 fL (ref 80.0–100.0)
MPV: 9.3 fL (ref 9.1–12.4)
Monocytes %: 5.4 %
Monocytes Absolute: 0.29 10*3/uL — ABNORMAL LOW (ref 0.36–0.77)
NRBC %: 0 % (ref 0.0–0.0)
NRBC Absolute: 0 10*3/uL (ref 0.00–2.00)
Neutrophil %: 51.8 %
Neutrophils Absolute: 2.81 10*3/uL (ref 1.50–7.95)
Platelets: 226 10*3/uL (ref 150–400)
RBC: 4.84 M/uL (ref 3.70–5.20)
RDW-CV: 12.6 % (ref 11.5–14.5)
RDW-SD: 39.8 fL (ref 35.0–51.0)
WBC: 5.4 10*3/uL (ref 4.0–11.0)

## 2022-10-20 LAB — COMPREHENSIVE METABOLIC PANEL
ALT: 21 U/L (ref 0–55)
AST: 24 U/L (ref 6–42)
Albumin: 4.4 g/dL (ref 3.2–5.0)
Alkaline phosphatase: 90 U/L (ref 30–130)
Anion Gap: 14 mmol/L (ref 3–14)
BUN: 13 mg/dL (ref 6–24)
Bilirubin, total: 0.4 mg/dL (ref 0.2–1.2)
CO2 (Bicarbonate): 23 mmol/L (ref 20–32)
Calcium: 9.6 mg/dL (ref 8.5–10.5)
Chloride: 102 mmol/L (ref 98–110)
Creatinine: 0.73 mg/dL (ref 0.55–1.30)
Glucose: 90 mg/dL (ref 70–139)
Potassium: 3.8 mmol/L (ref 3.6–5.2)
Protein, total: 7.5 g/dL (ref 6.0–8.4)
Sodium: 139 mmol/L (ref 135–146)
eGFRcr: 105 mL/min/{1.73_m2} (ref >=60)

## 2022-10-20 LAB — LIPID PANEL
Cholesterol/HDL Ratio: 5.7
Cholesterol: 234 mg/dL — ABNORMAL HIGH (ref <=200)
HDL cholesterol: 41 mg/dL (ref >=40)
LDL cholesterol, calculated: 174 mg/dL — ABNORMAL HIGH (ref 0–130)
Non-HDL Calculation: 193 mg/dL — ABNORMAL HIGH (ref <160)
Triglycerides: 96 mg/dL (ref <=150)

## 2022-10-20 MED ORDER — azithromycin (Zithromax) 500 mg tablet
500 | ORAL_TABLET | Freq: Every day | ORAL | 2 refills | Status: DC
Start: 2022-10-20 — End: 2023-01-05

## 2022-10-20 MED ORDER — rifAMPin (Rifadin) 300 mg capsule
300 | ORAL_CAPSULE | Freq: Every day | ORAL | 2 refills | 30.00000 days | Status: AC
Start: 2022-10-20 — End: 2022-11-24

## 2022-10-20 NOTE — Telephone Encounter (Addendum)
Patient has new mole on labia, not sure if it is something to be concerned about or the aging process    10/20/2022 4:48 PM Anderson Malta S. Elba Barman, MD  (1)new mole and explained need an appointment for this visit and may remove it if I feel it is needed  (2)also complains vaginal discharge and is on antib and like to have this check    Advise advice to return to office for an appointment

## 2022-10-20 NOTE — Progress Notes (Signed)
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 She presents today for follow-up of treatment of the M. Marinum lesion after completing about 14 weeks total of therapy.    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 She originally noticed a violaceous lesion on the dorsum of her right 4th finger around May 2021. An x-ray at the time showed a 3 mm metallic foreign body, which ended up being a very small metal splinter that spontaneously came out with removal of a scab. She underwent wound exploration and biopsy, but no additional foreign body was found and pathology reportedly showed chronic inflammation. She was treated with intermittent doxycycline with waxing/waning appearance of the lesion. In March 2022, she was seen by Dr. 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 She was seen by Dr. Leta Baptist from Infectious Diseases in May and October 2022, but treatment for the M. Marinum skin infection was deferred given that she was pregnant with twins and then breastfeeding two infants. The skin lesion improved somewhat during pregnancy, but flared again in late 2023, causing her to re-present to Dermatology in October 2023. On 07/10/22, she was seen by Dr. Rande Lawman and started on clarithromycin 500 mg PO BID. She had some improvement on  clarithromycin monotherapy with lightening of the lesion. On 07/28/22, I saw her in ID clinic and recommended switching to dual therapy with azithromycin and rifampin given higher chance of cure than with monotherapy. Her baseline labs and EKG were normal.    Interval History  [name] continues to take azithromycin, rifampin, and atorvastatin first thing in the morning on an empty stomach and reports feeling well overall. She has multiple soft stools per day, but states that this is her baseline. She has not had any diarrhea (since stopping metformin), abdominal pain, nausea, vomiting, fever, chills, headache, or rashes. The improvement in the lesion on her right 4th finger has slowed somewhat, but the lesion continues to appear more flat than prior. She has not had any new skin lesions or any pain/swelling in her hand. She has not had any bleeding or drainage from the plaque on her right 4th finger.    Review of Systems   Pertinent positives and negatives as per HPI, otherwise a 10 point ROS was negative.    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 D&C FIRST TRIMESTER / [place] / MISSED / SEPTIC / INDUCED ABORTION N/A 2013    arcuate uterus        Social History  Lives in [place] with her husband and 4 children. She works as a Veterinary surgeon at [employer]. No history of tobacco, alcohol, or substance use.    Family  History  Not contributory to this consult.    Allergies  Patient has no known allergies.    Current Medications  Current Outpatient Medications   Medication Sig Dispense Refill   . atorvastatin (Lipitor) 10 mg tablet Take 1 tablet (10 mg) by mouth once daily. To lower cholesterol 90 tablet 1   . CALCIUM CARBONATE ORAL Take 1 tablet by mouth in the morning.     . cholecalciferol, vitamin D3, 50 mcg (2,000 unit) tablet,chewable Chew 2,000  Units in the morning.     Marland Kitchen azithromycin (Zithromax) 500 mg tablet Take 1 tablet (500 mg) by mouth once daily. 30 tablet 2   . metFORMIN (Glucophage) 500 mg tablet Take 1 tablet (500 mg) by mouth with breakfast and with evening meal. 60 tablet 11   . rifAMPin (Rifadin) 300 mg capsule Take 2 capsules (600 mg) by mouth once daily. 60 capsule 2     No current facility-administered medications for this visit.       Physical Exam:  Vitals: Blood pressure 106/73, pulse 81, temperature 36.3 C (97.3 F), temperature source Temporal, resp. rate 18, weight 78.7 kg, last menstrual period 09/29/2022, SpO2 97%, not currently breastfeeding.Gen: Well-appearing woman, alert and conversant.   HEENT: EOMI, no scleral icterus.  Chest: CTAB.  Cardiac: RRR, nl S1 S2, no murmurs.  Abdomen: Soft, non-tender, non-distended.  Neuro: A&O x 3, normal gait, moving all extremities.  Upper Extremities: R 4th finger with an improving violaceous plaque over the dorsal surface of the PIP joint and proximal phalanx. Compared to last month, this plaque appears less indurated and rough, although unchanged in color. There are no subcutaneous nodules or lymphadenopathy.  Skin: No rashes or lesions apart from the right 4th finger lesion.          09/22/22:      08/25/22:      07/28/22:      07/10/22 ([place]):        Labs  Lab Results   Component Value Date    WBC 5.4 10/20/2022    HGB 14.5 10/20/2022    MCV 86.4 10/20/2022    PLT 226 10/20/2022          Chemistry    Lab Results   Component Value Date/Time    NA 140 09/22/2022 1237    K 3.4 (L) 09/22/2022 1237    CL 104 09/22/2022 1237    CO2 26 09/22/2022 1237    BUN 13 09/22/2022 1237    CREATININE 0.68 09/22/2022 1237    Lab Results   Component Value Date/Time    CALCIUM 9.5 09/22/2022 1237    ALKPHOS 95 09/22/2022 1237    AST 27 09/22/2022 1237    ALT 23 09/22/2022 1237    BILITOT 0.4 09/22/2022 1237        Microbiology  Wound:  11/29/20 R 4th finger wound biopsy: Culture grew Mycobacterium  marinum.      Pathology  11/29/20 R 4th finger punch biopsy:  A. RIGHT DORSAL 4TH FINGER, PUNCH BS   DENSE, WELL-FORMED DERMAL GRANULOMATOUS DERMATITIS (SEE COMMENT).   COMMENT: The findings are concerning for an infectious etiology. However, no fungal   organisms are identified with repeat PAS stains and repeat gram and AFB stains are negative   for pathogenic bacterial organisms. Tissue culture is recommended. Multiple levels examined.        Assessment and Plan  #M. 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 She presents today for follow-up of treatment of the M. Marinum lesion after completing about 14 weeks total of therapy.    Ms. Ausley right 4th finger lesion appears to be slowly improving on her current regimen, with a continued decrease in the induration and erythema of the main lesion, although no significant change in the color of the skin. The discomfort at the site has completely resolved. She continues to tolerate rifampin and azithromycin without any significant side effects. CBC today was normal; LFTs and a lipid panel are pending. Although her improvement has slowed slightly, given that we are still making progress, I recommended continuing the same regimen, with a likely plan for at least 6 months total of therapy. I asked Ms. Lauritsen to reach out to me if she feels that the lesion is becoming larger or more indurated prior to her next appointment.    We repeated a fasting lipid panel this morning given that rifampin has the potential to decrease the efficacy of atorvastatin. I will follow-up this result and reach out to her PCP.    Diagnostics:  - Repeat CBC/d today was normal  - F/u pending CMP, fasting lipid panel  -  EKG on 07/28/22 showed a QTc of 446    Therapeutics:  - Continue azithromycin 500 mg PO daily  - Continue rifampin 600 mg PO daily taken first thing in the morning on an empty stomach  - Continue taking atorvastatin at the same time as rifampin to minimize drug-drug interactions    MIPS  Last hemoglobin A1c: 5.5%  - Does patient had a history of hypertension: no  - Does patient have a history of diabetes: no     Follow Up Plan  - In person visit with me on 11/24/22 with labs beforehand    Etter Sjogren. Shanta Hartner, M.D.  Infectious Diseases Attending

## 2022-10-21 NOTE — Telephone Encounter (Signed)
Patient would like to be called back;  [phone]     She is concerned about last abnormal lab results and wants to know next steps on regards of her health.  Thanks

## 2022-10-22 ENCOUNTER — Telehealth
Admit: 2022-10-22 | Discharge: 2022-10-22 | Payer: BLUE CROSS/BLUE SHIELD | Attending: Internal Medicine | Primary: Family Medicine

## 2022-10-22 DIAGNOSIS — E282 Polycystic ovarian syndrome: Secondary | ICD-10-CM

## 2022-10-22 MED ORDER — metFORMIN XR (Glucophage-XR) 500 mg 24 hr tablet
500 | ORAL_TABLET | Freq: Two times a day (BID) | ORAL | 3 refills | Status: AC
Start: 2022-10-22 — End: ?

## 2022-10-22 NOTE — Progress Notes (Signed)
[facility]  [address]  [place] [address]  Dept Phone: [phone]  Dept Fax: [phone]     Endocrinology Clinic Note    Referring Physician: Shirlee Limerick, MD      History of present illness:  Paige [name] is a 42 y.o. female  PMH of PCOS,HL here for f/u of GDM    not Breast feeding , menses regular, not losing any weight, got diarrhea and nausea with regular metformin.    LDL very high 174 but now down to 194 ,PCP started atorvastatin 10 mg/d    Prior to this I have seen her in 2019 with history of gestational diabetes.  Was on metformin in the first trimester, then gestational diabetes was diet controlled    42 y/o boy-7lb 1 ounce, was born in [place], no GDM then but was on metformin until 14 weeks and was on metformin even in this pregnancy until 14 weeks and was stopped by Dr Elba Barman    42 y/o boy- 6lb 12 ounces-GDM, diet controlled      PCOS Diagnosed 8 years ago based on oligomenorrhea, no acne/hirsuitism-clomiphene/IUI second preg first pregnancy she lost the bay at 9 weeks,this preg-no clomiphene but was on metformin. 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,married      Assessment/Plan:  Paige [name] is a 42 y.o. female G4P2 PMH of PCOS,HL here for f/u of GDM    H/o Gestational diabetes: last a1c ok 5.5%    PCO S: Currently off of metformin due to side effects can try slow release and will start at 500 mg XR daily if tolerating well in 1 week can increase it to twice daily always take metformin with food to minimize risk of side effects.   menses are regular, not planning another pregnancy.  She is interested in medical weight loss will [facility].  We will redo her testosterone level and A1c in 4 months    LDL up again, breast feeding, strong family history of hypercholesterolemia. LDL very high 174 but now down to 194 ,PCP started atorvastatin 10 mg/d    She prefers  video visits for now,follow-up in 4 months with labs    Thank you for involving in this patient's care. Please do not hesitate to contact with questions or concerns         Mary Sella, MD         Pertinent lab review:    Lab Results   Component Value Date    HGBA1C 5.5 07/28/2022      Lab Results   Component Value Date    GLUCOSE 90 10/20/2022    CALCIUM 9.6 10/20/2022    NA 139 10/20/2022    K 3.8 10/20/2022    CO2 23 10/20/2022    CL 102 10/20/2022    BUN 13 10/20/2022    CREATININE 0.73 10/20/2022      Lab Results   Component Value Date    LDLCALC 174 (H) 10/20/2022      Lab Results   Component Value Date    HGBA1C 5.5 07/28/2022     Lab Results   Component Value Date    GLUF 82 03/06/2021    LDLCALC 174 (H) 10/20/2022    CREATININE 0.73 10/20/2022        Lab Results   Component Value Date    ALT 21 10/20/2022  AST 24 10/20/2022    ALKPHOS 90 10/20/2022    BILITOT 0.4 10/20/2022                Medical History:    Past Medical History:   Diagnosis Date   . Diabetes mellitus (CMS-HCC) ([place]) 3017   . STD (sexually transmitted disease) 2005   . Tinea corporis 2006         Surgical History:    Past Surgical History:   Procedure Laterality Date   . D&C FIRST TRIMESTER / [place] INCOMPLETE / MISSED / SEPTIC / INDUCED ABORTION N/A 2013    arcuate uterus          Family History:    @BIMFAMHISTORY @       Social History:    No valid surgical or medical questions entered.        Active medical problems:    Patient Active Problem List   Diagnosis   . Encounter for gynecological examination (general) (routine) without abnormal findings   . Eczema   . Allergy, unspecified, initial encounter   . Hyperlipidemia   . Vitamin D deficiency   . History of gestational diabetes   . Vesicular skin lesions   . Post-nasal drip   . Obesity   . Melanocytic nevi, unspecified   . Polycystic ovarian syndrome   . Gastroesophageal reflux disease   . Diet controlled gestational diabetes mellitus (GDM) in third trimester (HHS-HCC)   .  Mycobacteria, atypical   . Skin lesion of breast   . Skin lesion of left leg         Current medications:      Current Outpatient Medications:   .  atorvastatin (Lipitor) 10 mg tablet, Take 1 tablet (10 mg) by mouth once daily. To lower cholesterol, Disp: 90 tablet, Rfl: 1  .  azithromycin (Zithromax) 500 mg tablet, Take 1 tablet (500 mg) by mouth once daily., Disp: 30 tablet, Rfl: 2  .  CALCIUM CARBONATE ORAL, Take 1 tablet by mouth in the morning., Disp: , Rfl:   .  cholecalciferol, vitamin D3, 50 mcg (2,000 unit) tablet,chewable, Chew 2,000 Units in the morning., Disp: , Rfl:   .  metFORMIN (Glucophage) 500 mg tablet, Take 1 tablet (500 mg) by mouth with breakfast and with evening meal., Disp: 60 tablet, Rfl: 11  .  rifAMPin (Rifadin) 300 mg capsule, Take 2 capsules (600 mg) by mouth once daily., Disp: 60 capsule, Rfl: 2       Allergies:    No Known Allergies         ROS:    No palpitations, no tremor, no heat intolerance. No cold intolerance.  No diarrhea or constipation. No change in hair or skin. No dysphagia, no odynophagia, no solid food or pill dysphagia. No diplopia/blurry vision.No fevers/chills.No change in voice.No anterior neck pressure. No increase in neck size.No wheeze or DOE.No rash.No edema. No cough.No anxiety/Depression.Regular menses Rest of ROS negative or non-contributory.       Physical Exam:    There were no vitals taken for this visit.   PJA:SNKNL alert no acute distress  HEENT: head atraumatic, normocephalic, no redness in eyes no discharge from nose  SKIN: facial skin does not show any cyanosis  RESP: speaking in full sentences, no increased respiratory effort seen  MSK: normal ROM in fingers  Neuro: facial nerves intact  Pysch: appearance, behavior and attitude-pleasant, cooperative and groomed        This real-time, interactive virtual Telehealth encounter was  done by video  with the patient's verbal consent. Two patient identifiers were used and confirmed. Physical location of the  patient: home  Patient resides in: [place]  Physical location of the provider: office  Other participants/involvement: none        This note has been created with voice recognition software.  While this note has been edited for accuracy, this software periodically misinterprets speech resulting in errors that might not have been caught in editing.  In the event an unusual error is found in this record, please notify us at  [phone] to resolve the error.

## 2022-10-22 NOTE — Telephone Encounter (Signed)
Called patient back, labs acceptable except cholesterol, per infectious disease will need higher statin dosage while on rifampin, left patient message

## 2022-10-24 ENCOUNTER — Ambulatory Visit
Admit: 2022-10-24 | Discharge: 2022-10-24 | Payer: BLUE CROSS/BLUE SHIELD | Attending: Obstetrics & Gynecology | Primary: Family Medicine

## 2022-10-24 DIAGNOSIS — N898 Other specified noninflammatory disorders of vagina: Secondary | ICD-10-CM

## 2022-10-24 MED ORDER — atorvastatin (Lipitor) 20 mg tablet
20 | ORAL_TABLET | Freq: Every day | ORAL | 1 refills | 90.00000 days | Status: AC
Start: 2022-10-24 — End: 2023-04-22

## 2022-10-24 NOTE — Progress Notes (Signed)
NAME:Paige [name]   DOB:November 20, 1980   SAY:[id number]    DATE OF VISIT: 10/24/2022    Chaperone offered - declined  Primary care doctor:    Problem: visit  1. CHIEF COMPLAINT: PT IS TAKING 2 ANTIBIOTICS BECAUSE OF INFECTION IN HER FINGER  2. PT SAYS SHE NOTICED HER VAGINAL HAIR HAS CHANGED COLOR BECAUSE ONE OF THE ANTIBIOTICS MAKES HER URINE ORANGE.  3. PT ALSO HAS HAD VAGINAL DISCHARGE AND NOTICED A MOLE SHE WOULD LIKE CHECKED   4. Vaginal discharge for on month - no itching   5. Hair discolor ? From col  6. Her urine is slight orange - from [facility]    Early intervention for speech    This is a new gyn problem    Other c/o: none  NAT:[id number]'U last menstrual period was 09/29/2022.          Last PE with pcp:     last urine with pcp:      Review of Systems   Constitutional: Negative.    HENT: Negative.    Eyes: Negative.    Respiratory: Negative.    Cardiovascular: Negative.    Gastrointestinal: Negative.    Endocrine: Negative.    Genitourinary: Negative.    Musculoskeletal: Negative.    Skin: Negative.    Allergic/Immunologic: Negative.    Neurological: Negative.         OBJECTIVE VS  BP 103/71   Ht 1.549 m   Wt 77.6 kg   LMP 09/29/2022   BMI 32.31 kg/m      Patient Active Problem List   Diagnosis   . Encounter for gynecological examination (general) (routine) without abnormal findings   . Eczema   . Allergy, unspecified, initial encounter   . Hyperlipidemia   . Vitamin D deficiency   . History of gestational diabetes   . Vesicular skin lesions   . Post-nasal drip   . Obesity   . Melanocytic nevi, unspecified   . Polycystic ovarian syndrome   . Gastroesophageal reflux disease   . Diet controlled gestational diabetes mellitus (GDM) in third trimester (HHS-HCC)   . Mycobacteria, atypical   . Skin lesion of breast   . Skin lesion of left leg        Current Outpatient Medications   Medication Sig Dispense Refill   . atorvastatin (Lipitor) 20 mg tablet Take 1 tablet (20 mg) by mouth once daily. To lower cholesterol  90 tablet 1   . azithromycin (Zithromax) 500 mg tablet Take 1 tablet (500 mg) by mouth once daily. 30 tablet 2   . CALCIUM CARBONATE ORAL Take 1 tablet by mouth in the morning.     . cholecalciferol, vitamin D3, 50 mcg (2,000 unit) tablet,chewable Chew 2,000 Units in the morning.     . metFORMIN XR (Glucophage-XR) 500 mg 24 hr tablet Take 1 tablet (500 mg) by mouth twice daily. Do not crush, chew, or split. 180 tablet 3   . rifAMPin (Rifadin) 300 mg capsule Take 2 capsules (600 mg) by mouth once daily. 60 capsule 2     No current facility-administered medications for this visit.        Physical Exam  Genitourinary:         Comments: Angiokeratomas         Assessment/Plan      Vaginal discharge  -     Genital Culture  -     GC/CT (Amp DNA)  Advise her to call if she did not receive a  call or see her result in 1 wk     Angiokeratoma - reassure pt  Other orders  -     Genital Culture       Orders Placed This Encounter   Procedures   . Genital Culture   . GC/CT (Amp DNA)   . Genital Culture        Instruction:    Advise her to call if she did not receive a call or see her result in 1 wk   Keep 07/09/2023 annual gynecological examination

## 2022-10-24 NOTE — Telephone Encounter (Signed)
Called patient, [phone], left another message to let me know she received my voicemail and if she is willing to increase cholesterol medication dosage while on Rifampin which lowers the effectiveness of cholesterol medication

## 2022-10-24 NOTE — Progress Notes (Signed)
Increased atorvastatin while on Rifampin, repeat labs 6-8 weeks order in

## 2022-10-27 LAB — GC/CT (AMP DNA)
C trach NAA: NEGATIVE
N gonorrhoeae NAA: NEGATIVE

## 2022-10-28 LAB — GENITAL CULTURE

## 2022-10-28 LAB — GENITAL CULTURE (REFLEX)

## 2022-11-24 ENCOUNTER — Ambulatory Visit: Payer: BLUE CROSS/BLUE SHIELD | Primary: Family Medicine

## 2022-11-24 ENCOUNTER — Ambulatory Visit: Admit: 2022-11-24 | Discharge: 2022-11-24 | Payer: BLUE CROSS/BLUE SHIELD | Primary: Family Medicine

## 2022-11-24 ENCOUNTER — Ambulatory Visit
Admit: 2022-11-24 | Discharge: 2022-11-24 | Payer: BLUE CROSS/BLUE SHIELD | Attending: Pediatrics | Primary: Family Medicine

## 2022-11-24 DIAGNOSIS — A311 Cutaneous mycobacterial infection: Secondary | ICD-10-CM

## 2022-11-24 LAB — CBC WITH DIFFERENTIAL
Basophils %: 0.7 %
Basophils Absolute: 0.04 10*3/uL (ref 0.00–0.22)
Eosinophils %: 2.3 %
Eosinophils Absolute: 0.13 10*3/uL (ref 0.00–0.50)
Hematocrit: 44 % (ref 32.0–47.0)
Hemoglobin: 14.6 g/dL (ref 11.0–16.0)
Immature Granulocytes %: 0.2 %
Immature Granulocytes Absolute: 0.01 10*3/uL (ref 0.00–0.10)
Lymphocyte %: 36.7 %
Lymphocytes Absolute: 2.07 10*3/uL (ref 0.70–4.00)
MCH: 30 pg (ref 26.0–34.0)
MCHC: 33.2 g/dL (ref 31.0–37.0)
MCV: 90.3 fL (ref 80.0–100.0)
MPV: 10.1 fL (ref 9.1–12.4)
Monocytes %: 5.7 %
Monocytes Absolute: 0.32 10*3/uL — ABNORMAL LOW (ref 0.36–0.77)
NRBC %: 0 % (ref 0.0–0.0)
NRBC Absolute: 0 10*3/uL (ref 0.00–2.00)
Neutrophil %: 54.4 %
Neutrophils Absolute: 3.07 10*3/uL (ref 1.50–7.95)
Platelets: 250 10*3/uL (ref 150–400)
RBC: 4.87 M/uL (ref 3.70–5.20)
RDW-CV: 12.6 % (ref 11.5–14.5)
RDW-SD: 41.8 fL (ref 35.0–51.0)
WBC: 5.6 10*3/uL (ref 4.0–11.0)

## 2022-11-24 LAB — COMPREHENSIVE METABOLIC PANEL
ALT: 14 U/L (ref 0–55)
AST: 19 U/L (ref 6–42)
Albumin: 4.3 g/dL (ref 3.2–5.0)
Alkaline phosphatase: 88 U/L (ref 30–130)
Anion Gap: 10 mmol/L (ref 3–14)
BUN: 14 mg/dL (ref 6–24)
Bilirubin, total: 0.4 mg/dL (ref 0.2–1.2)
CO2 (Bicarbonate): 26 mmol/L (ref 20–32)
Calcium: 9.8 mg/dL (ref 8.5–10.5)
Chloride: 103 mmol/L (ref 98–110)
Creatinine: 0.72 mg/dL (ref 0.55–1.30)
Glucose: 113 mg/dL (ref 70–139)
Potassium: 3.7 mmol/L (ref 3.6–5.2)
Protein, total: 7.4 g/dL (ref 6.0–8.4)
Sodium: 139 mmol/L (ref 135–146)
eGFRcr: 106 mL/min/{1.73_m2} (ref >=60)

## 2022-11-24 NOTE — Progress Notes (Signed)
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 She presents today for follow-up of treatment of the M. Marinum lesion after completing about 19 weeks total of therapy.    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 She originally noticed a violaceous lesion on the dorsum of her right 4th finger around May 2021. An x-ray at the time showed a 3 mm metallic foreign body, which ended up being a very small metal splinter that spontaneously came out with removal of a scab. She underwent wound exploration and biopsy, but no additional foreign body was found and pathology reportedly showed chronic inflammation. She was treated with intermittent doxycycline with waxing/waning appearance of the lesion. In March 2022, she was seen by Dr. 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 She was seen by Dr. Leta Baptist from Infectious Diseases in May and October 2022, but treatment for the M. Marinum skin infection was deferred given that she was pregnant with twins and then breastfeeding two infants. The skin lesion improved somewhat during pregnancy, but flared again in late 2023, causing her to re-present to Dermatology in October 2023. On 07/10/22, she was seen by Dr. Rande Lawman and started on clarithromycin 500 mg PO BID. She had some improvement on  clarithromycin monotherapy with lightening of the lesion. On 07/28/22, I saw her in ID clinic and recommended switching to dual therapy with azithromycin and rifampin given higher chance of cure than with monotherapy. Her baseline labs and EKG were normal.    Interval History  Paige [name] continues to take azithromycin, rifampin, and atorvastatin first thing in the morning on an empty stomach and reports feeling well overall. She feels mildly nauseous about an hour after taking her medications, but reports that this resolves with eating breakfast. She started extended release metformin (which she takes twice daily with food), which she is tolerating well without diarrhea. She has occasional headaches that resolve with tylenol. She has not had any fatigue, anorexia, abdominal pain, vomiting, fever, chills, vision changes, easy bruising, gum bleeding, or rashes. The lesion on her right finger continues to improve slowly; it appears slightly more light in color compared to 4 weeks ago and is now flat. The lesion is no longer painful and has not had any bleeding or drainage for months.    Her lipid panel showed elevated total cholesterol (234) and LDL (174), which prompted her PCP to increase her dose of atorvastatin to 20 mg daily.    Review of Systems   Pertinent positives and negatives as per HPI, otherwise a 10 point ROS was negative.    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 She works as a Veterinary surgeon at [employer]. No history of tobacco, alcohol, or substance use.    Family History  Not contributory to this consult.    Allergies  Patient has no known allergies.    Current  Medications  Current Outpatient Medications   Medication Sig Dispense Refill   . atorvastatin (Lipitor) 20 mg tablet Take 1 tablet (20 mg) by mouth once daily. To lower cholesterol 90 tablet 1   . azithromycin (Zithromax) 500 mg tablet Take 1 tablet (500 mg) by mouth once daily. 30 tablet 2   . CALCIUM CARBONATE ORAL Take 1 tablet by mouth in the morning.     . cholecalciferol, vitamin D3, 50 mcg (2,000 unit) tablet,chewable Chew 2,000 Units in the morning.     . metFORMIN XR (Glucophage-XR) 500 mg 24 hr tablet Take 1 tablet (500 mg) by mouth twice daily. Do not crush, chew, or split. 180 tablet 3   . rifAMPin (Rifadin) 300 mg capsule Take 2 capsules (600 mg) by mouth once daily. 60 capsule 2     No current facility-administered medications for this visit.       Physical Exam:  Vitals: Blood pressure 102/72, pulse 64, weight 78.7 kg, not currently breastfeeding.Gen: Well-appearing woman, alert and conversant.   HEENT: EOMI, no scleral icterus.  Chest: CTAB.  Cardiac: RRR, nl S1 S2, no murmurs.  Abdomen: Soft, non-tender, non-distended.  Neuro: A&O x 3, normal gait, moving all extremities.  Upper Extremities: R 4th finger with a slowly improving violaceous plaque over the dorsal surface of the proximal phalanx and PIP joint (which was initially more raised/nodular). Compared to last month, this area appears nearly flat, more smooth in texture, and lighter in color. The discoloration on the distal phalanx appears consistent with post-inflammatory hyperpigmentation as the color is more grey/brown and no longer violaceous. There are no subcutaneous nodules or lymphadenopathy.  Skin: No rashes or lesions apart from the right 4th finger lesion.    11/24/22:      10/20/22:      09/22/22:      08/25/22:      07/28/22:      07/10/22 ([place]):        Labs  Lab Results   Component Value Date    WBC 5.4 10/20/2022    HGB 14.5 10/20/2022    MCV 86.4 10/20/2022    PLT 226 10/20/2022          Chemistry    Lab Results   Component  Value Date/Time    NA 139 10/20/2022 0801    K 3.8 10/20/2022 0801    CL 102 10/20/2022 0801    CO2 23 10/20/2022 0801    BUN 13 10/20/2022 0801    CREATININE 0.73 10/20/2022 0801    Lab Results   Component Value Date/Time    CALCIUM 9.6 10/20/2022 0801    ALKPHOS 90 10/20/2022 0801    AST 24 10/20/2022 0801    ALT 21 10/20/2022 0801    BILITOT 0.4 10/20/2022 0801        Microbiology  Wound:  11/29/20 R 4th finger wound biopsy: Culture grew Mycobacterium marinum.      Pathology  11/29/20 R 4th finger punch biopsy:  A. RIGHT DORSAL 4TH FINGER, PUNCH BS   DENSE, WELL-FORMED DERMAL GRANULOMATOUS DERMATITIS (  SEE COMMENT).   COMMENT: The findings are concerning for an infectious etiology. However, no fungal   organisms are identified with repeat PAS stains and repeat gram and AFB stains are negative   for pathogenic bacterial organisms. Tissue culture is recommended. Multiple levels examined.        Assessment and Plan  #M. 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 She presents today for follow-up of treatment of the M. Marinum lesion after completing about 19 weeks total of therapy.    Paige [name] right 4th finger lesion appears to be slowly improving on her current regimen. Over the last month, the lesion has become more flat, smooth, and lighter in color. We discussed today that the slow improvement may be due to the fact that this infection was present for several years prior to initiation of antimicrobial therapy and may have involved deeper structures than initially anticipated. Given the violaceous appearance of the skin on the proximal phalanx, which is consistent with active infection, I recommended continuing with antibiotic therapy  until about two months after the violaceous discoloration has resolved. I suspect that some of the grey-brown discoloration of the skin on the distal phalanx may be due to post-inflammatory hyperpigmentation rather than active infection. Fortunately, Paige [name] continues to tolerate azithromycin and rifampin well without side effects. Monitoring labs were checked and normal today.     Diagnostics:  - Will repeat CBC/d and CMP in 6 weeks  - EKG on 07/28/22 showed a QTc of 446    Therapeutics:  - Continue azithromycin 500 mg PO daily  - Continue rifampin 600 mg PO daily taken first thing in the morning on an empty stomach  - Continue taking atorvastatin at the same time as rifampin to minimize drug-drug interactions    MIPS  Last hemoglobin A1c: 5.5%  - Does patient had a history of hypertension: no  - Does patient have a history of diabetes: no     Follow Up Plan  - In person visit with me on 01/05/23 with labs beforehand on the same day    Delle Andrzejewski M. Warrick Llera, M.D.  Infectious Diseases Attending

## 2022-12-10 NOTE — Telephone Encounter (Signed)
The Central Referral Team has prepared this In-PHO Referral Order for you.  Your patient provided the information used to complete the details.  When you sign off it implies that you are in agreement that this referral is the appropriate clinical plan for this patient.    In order to attach a Referral Order to you we needed to open a Phone Note Encounter which also needs to be signed.  Please:  1. Review the Order for clinical appropriateness  2. Sign the Order  3. Sign off the Phone Note Encounter    Thanks- Central Referral Team    OON Referral Information  Patient is requesting an out of system specialist visit.Karn Pickler MD    4. Explain patient's medical problem: Weight Loss  5. Explain why the patient chose this specific specialist: referred by Dr. Theodora Blow  3.   Has patient seen/spoken with PCP for this problem? No  4.   Has patient seen a [facility] specialist for this problem? Yes  5.   Specialist Hospital system?           [facility]   6.   Specialist NPI: [id number]  7.   Does patient have a past established relationship with this specialist or this practice?   No                8.   Estimated last past visit:   9.   Number of visits requested:              6        10. Appointment date for this request: 01/08/2023  11. Any other info:    Document which member of your team has educated patient that:  In the future, this PCP prefers that their patients choose a specialist by having a discussion with PCP team first.  Our preferred specialists are in [facility] and [place] : [place]    For PCP TMP Comments:

## 2022-12-13 NOTE — Telephone Encounter (Signed)
Specialist at [facility]; referred by Dr Tedra Senegal; referral authorized

## 2022-12-30 ENCOUNTER — Ambulatory Visit: Payer: BLUE CROSS/BLUE SHIELD | Attending: Registered" | Primary: Family Medicine

## 2023-01-05 ENCOUNTER — Ambulatory Visit
Admit: 2023-01-05 | Discharge: 2023-01-05 | Payer: BLUE CROSS/BLUE SHIELD | Attending: Pediatrics | Primary: Family Medicine

## 2023-01-05 ENCOUNTER — Ambulatory Visit: Admit: 2023-01-05 | Discharge: 2023-01-05 | Payer: BLUE CROSS/BLUE SHIELD | Primary: Family Medicine

## 2023-01-05 ENCOUNTER — Ambulatory Visit: Payer: BLUE CROSS/BLUE SHIELD | Primary: Family Medicine

## 2023-01-05 ENCOUNTER — Ambulatory Visit: Payer: BLUE CROSS/BLUE SHIELD | Attending: Pediatrics | Primary: Family Medicine

## 2023-01-05 DIAGNOSIS — A311 Cutaneous mycobacterial infection: Secondary | ICD-10-CM

## 2023-01-05 LAB — CBC WITH DIFFERENTIAL
Basophils %: 0.6 %
Basophils Absolute: 0.04 10*3/uL (ref 0.00–0.22)
Eosinophils %: 1.6 %
Eosinophils Absolute: 0.11 10*3/uL (ref 0.00–0.50)
Hematocrit: 41.3 % (ref 32.0–47.0)
Hemoglobin: 14.2 g/dL (ref 11.0–16.0)
Immature Granulocytes %: 0.1 %
Immature Granulocytes Absolute: 0.01 10*3/uL (ref 0.00–0.10)
Lymphocyte %: 35.2 %
Lymphocytes Absolute: 2.44 10*3/uL (ref 0.70–4.00)
MCH: 30.3 pg (ref 26.0–34.0)
MCHC: 34.4 g/dL (ref 31.0–37.0)
MCV: 88.1 fL (ref 80.0–100.0)
MPV: 9.6 fL (ref 9.1–12.4)
Monocytes %: 6.2 %
Monocytes Absolute: 0.43 10*3/uL (ref 0.36–0.77)
NRBC %: 0 % (ref 0.0–0.0)
NRBC Absolute: 0 10*3/uL (ref 0.00–2.00)
Neutrophil %: 56.3 %
Neutrophils Absolute: 3.91 10*3/uL (ref 1.50–7.95)
Platelets: 247 10*3/uL (ref 150–400)
RBC: 4.69 M/uL (ref 3.70–5.20)
RDW-CV: 12.9 % (ref 11.5–14.5)
RDW-SD: 41.2 fL (ref 35.0–51.0)
WBC: 6.9 10*3/uL (ref 4.0–11.0)

## 2023-01-05 LAB — COMPREHENSIVE METABOLIC PANEL
ALT: 20 U/L (ref 0–55)
AST: 27 U/L (ref 6–42)
Albumin: 4.5 g/dL (ref 3.2–5.0)
Alkaline phosphatase: 86 U/L (ref 30–130)
Anion Gap: 10 mmol/L (ref 3–14)
BUN: 15 mg/dL (ref 6–24)
Bilirubin, total: 0.7 mg/dL (ref 0.2–1.2)
CO2 (Bicarbonate): 25 mmol/L (ref 20–32)
Calcium: 9.6 mg/dL (ref 8.5–10.5)
Chloride: 102 mmol/L (ref 98–110)
Creatinine: 0.74 mg/dL (ref 0.55–1.30)
Glucose: 90 mg/dL (ref 70–139)
Potassium: 3.8 mmol/L (ref 3.6–5.2)
Protein, total: 7.5 g/dL (ref 6.0–8.4)
Sodium: 137 mmol/L (ref 135–146)
eGFRcr: 103 mL/min/{1.73_m2} (ref >=60)

## 2023-01-05 MED ORDER — rifAMPin (Rifadin) 300 mg capsule
300 | ORAL_CAPSULE | Freq: Every day | ORAL | 2 refills | 30.00000 days | Status: DC
Start: 2023-01-05 — End: 2023-03-15

## 2023-01-05 MED ORDER — azithromycin (Zithromax) 500 mg tablet
500 | ORAL_TABLET | Freq: Every day | ORAL | 2 refills | Status: DC
Start: 2023-01-05 — End: 2023-03-15

## 2023-01-05 NOTE — Progress Notes (Signed)
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 She presents today for follow-up of treatment of the Paige [name] lesion after completing about 25 weeks total of therapy.    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 She originally noticed a violaceous lesion on the dorsum of her right 4th finger around May 2021. An x-ray at the time showed a 3 mm metallic foreign body, which ended up being a very small metal splinter that spontaneously came out with removal of a scab. She underwent wound exploration and biopsy, but no additional foreign body was found and pathology reportedly showed chronic inflammation. She was treated with intermittent doxycycline with waxing/waning appearance of the lesion. In March 2022, she was seen by Dr. Geraldo Docker from [facility] who performed a biopsy that grew Mycobacterium [name] (I tetracyclines, otherwise S). She was seen by Dr. Margy Clarks from Infectious Diseases in May and October 2022, but treatment for the Paige [name] skin infection was deferred given that she was pregnant with twins and then breastfeeding two infants. The skin lesion improved somewhat during pregnancy, but flared again in late 2023, causing her to re-present to Dermatology in October 2023. On 07/10/22, she was seen by Dr. Jake Bathe and started on clarithromycin 500 mg PO BID. She had some improvement on  clarithromycin monotherapy with lightening of the lesion. On 07/28/22, I saw her in ID clinic and recommended switching to dual therapy with azithromycin and rifampin given higher chance of cure than with monotherapy. Her baseline labs and EKG were normal. Monitoring labs have been normal over the last five months.    Interval History  Paige [name] continues to take azithromycin, rifampin, and atorvastatin first thing in the morning on an empty stomach and reports feeling well. She continues to have mild nausea right after taking her medications, but this resolves with eating. She has not had any fatigue, anorexia, abdominal pain, vomiting, fever, chills, vision changes, easy bruising, gum bleeding, or rashes. The lesion on her right finger appears about the same as last month -- it remains flat, but is about the same color. The lesion is no longer painful and has not had any bleeding or drainage for months.    Her monitoring labs today were normal, including: normal renal function, normal electrolytes, normal liver function, normal blood counts.    Review of Systems   Pertinent positives and negatives as per HPI, otherwise a 10 point ROS was negative.    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  She works as a Garment/textile technologist at [employer]. No history of tobacco, alcohol, or substance use.    Family History  Not contributory to this consult.    Allergies  Patient has no known allergies.    Current Medications  Current Outpatient Medications   Medication Sig Dispense Refill   . atorvastatin (Lipitor) 20 mg tablet Take 1 tablet (20 mg) by mouth once daily. To lower cholesterol 90 tablet 1   .  azithromycin (Zithromax) 500 mg tablet Take 1 tablet (500 mg) by mouth once daily. 30 tablet 2   . CALCIUM CARBONATE ORAL Take 1 tablet by mouth in the morning.     . cholecalciferol, vitamin D3, 50 mcg (2,000 unit) tablet,chewable Chew 2,000 Units in the morning.     . metFORMIN XR (Glucophage-XR) 500 mg 24 hr tablet Take 1 tablet (500 mg) by mouth twice daily. Do not crush, chew, or split. 180 tablet 3   . rifAMPin (Rifadin) 300 mg capsule Take 2 capsules (600 mg) by mouth once daily. 60 capsule 2     No current facility-administered medications for this visit.       Physical Exam:  Vitals: Blood pressure 114/68, pulse 69, temperature 36.4 C (97.5 F), temperature source Temporal, resp. rate 18, weight 77.2 kg, SpO2 100%, not currently breastfeeding.   Gen: Well-appearing woman, alert and conversant.   HEENT: EOMI, no scleral icterus.  Chest: CTAB.  Cardiac: RRR, nl S1 S2, no murmurs.  Abdomen: Soft, non-tender, non-distended.  Neuro: A&O x 3, normal gait, moving all extremities.  Upper Extremities: R 4th finger with a slowly improving violaceous plaque over the dorsal surface of the proximal phalanx and PIP joint (largely unchanged in the last month). The discoloration on the distal phalanx appears consistent with post-inflammatory hyperpigmentation as the color is more grey/brown and no longer violaceous. There are no subcutaneous nodules or lymphadenopathy.  Skin: No rashes or lesions apart from the right 4th finger lesion.    01/05/23:      11/24/22:      10/20/22:      09/22/22:      08/25/22:      07/28/22:      07/10/22 ([facility]):        Labs  Lab Results   Component Value Date    WBC 6.9 01/05/2023    HGB 14.2 01/05/2023    MCV 88.1 01/05/2023    PLT 247 01/05/2023          Chemistry    Lab Results   Component Value Date/Time    NA 137 01/05/2023 1036    K 3.8 01/05/2023 1036    CL 102 01/05/2023 1036    CO2 25 01/05/2023 1036    BUN 15 01/05/2023 1036    CREATININE 0.74 01/05/2023 1036    Lab Results    Component Value Date/Time    CALCIUM 9.6 01/05/2023 1036    ALKPHOS 86 01/05/2023 1036    AST 27 01/05/2023 1036    ALT 20 01/05/2023 1036    BILITOT 0.7 01/05/2023 1036        Microbiology  Wound:  11/29/20 R 4th finger wound biopsy: Culture grew Mycobacterium [name].      Pathology  11/29/20 R 4th finger punch biopsy:  A. RIGHT DORSAL 4TH FINGER, PUNCH BS   DENSE, WELL-FORMED DERMAL GRANULOMATOUS DERMATITIS (SEE COMMENT).   COMMENT: The findings are concerning for an infectious etiology. However, no fungal   organisms are identified with repeat PAS stains  and repeat gram and AFB stains are negative   for pathogenic bacterial organisms. Tissue culture is recommended. Multiple levels examined.        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 She presents today for follow-up of treatment of the Paige [name] lesion after completing about 25 weeks total of therapy.    Paige [name] right 4th finger lesion is significantly improved after ~5 months of treatment, with resolution of the nodular aspect and ulcerated areas, and lightening of the hyperpigmentation with a shift from a violaceous color to a more grey color. Over the last month, the lesion appears to be relatively unchanged, suggesting that some of the residual grey-purple hyperpigmentation may be scarring rather than due to persistent active infection. Given that her finger was debrided prior to the start of antimicrobials and as the nodular aspect of the lesion has resolved, I do not think that additional debridement is necessary. With Paige [name]'s permission, I discussed her case with several local experts in NTM infections. We will plan to continue therapy for  another 2 months. If the appearance of her skin lesion is unchanged at that time (after about 7 months of therapy), we will discuss stopping antimicrobials and monitoring closely for recurrence. Fortunately, Paige [name] continues to tolerate azithromycin and rifampin well without side effects. Monitoring labs were checked and normal today.     Diagnostics:  - CBC/d and CMP were normal today; will repeat labs in 8 weeks  - EKG on 07/28/22 showed a QTc of 446    Therapeutics:  - Continue azithromycin 500 mg PO daily  - Continue rifampin 600 mg PO daily taken first thing in the morning on an empty stomach  - Continue taking atorvastatin at the same time as rifampin to minimize drug-drug interactions    MIPS  Last hemoglobin A1c: 5.5%  - Does patient had a history of hypertension: no  - Does patient have a history of diabetes: no     Follow Up Plan  - In person visit with me on 03/09/23 with labs beforehand on the same day    Tarence Searcy M. Deaja Rizo, PaigeD.  Infectious Diseases Attending

## 2023-01-08 ENCOUNTER — Telehealth
Admit: 2023-01-08 | Discharge: 2023-01-08 | Payer: BLUE CROSS/BLUE SHIELD | Attending: Registered" | Primary: Family Medicine

## 2023-01-08 DIAGNOSIS — E785 Hyperlipidemia, unspecified: Secondary | ICD-10-CM

## 2023-01-08 NOTE — Progress Notes (Signed)
[facility]  [address]  [place] [address]  [phone]         Outpatient Nutrition Follow Up  Name: Paige [name]  MRN: [id number]  DOB: Nov 08, 1980    Date Seen: 01/08/2023        Frances Furbish, *     Visit Type: Telemedicine    This real-time, interactive virtual Telehealth encounter was done by video with the patient's verbal consent. Two patient identifiers were used and confirmed. Physical location of the patient: home Patient resides in: [place]  Physical location of the provider: home. Other participants/involvement: Dietetic Intern  Time start: 1:00pm Time end: 1:48pm    Pt Paige [name] is a 42 y.o. female is here today for a nutrition visit for the Medical Weight Loss program.    Past Medical History:  Patient Active Problem List   Diagnosis   . Encounter for gynecological examination (general) (routine) without abnormal findings   . Eczema   . Allergy, unspecified, initial encounter   . Hyperlipidemia   . Vitamin D deficiency   . History of gestational diabetes   . Vesicular skin lesions   . Post-nasal drip   . Obesity   . Melanocytic nevi, unspecified   . Polycystic ovarian syndrome   . Gastroesophageal reflux disease   . Diet controlled gestational diabetes mellitus (GDM) in third trimester (HHS-HCC)   . Mycobacteria, atypical   . Skin lesion of breast   . Skin lesion of left leg     Past Medical History:   Diagnosis Date   . Diabetes mellitus (CMS-HCC) (HHS-HCC) 3017   . STD (sexually transmitted disease) 2005   . Tinea corporis 2006       No Known Allergies    Pertinent Medications:  Current Outpatient Medications   Medication Instructions   . atorvastatin (LIPITOR) 20 mg, oral, Daily, To lower cholesterol   . azithromycin (ZITHROMAX) 500 mg, oral, Daily   . CALCIUM CARBONATE ORAL 1 tablet, oral, Daily   . cholecalciferol (vitamin D3) 2,000 Units, oral, Daily RT   . metFORMIN XR (GLUCOPHAGE-XR) 500 mg, oral, 2 times daily, Do not crush, chew, or split.    . 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   Feels safe at home: Yes  Supports: Yes   Barriers to learning: None     Weight History:  Program start wt: 170 lbs :  Highest wt:  lbs   Goal wt:   lbs  Why wt loss now: Wants to feel better and improve health (high cholesterol) for herself and children   Family hx:  Onset of wt change: mid-20s and having first child   Wt loss attempts: Plant based diet, food logging when dx'd with gestational DM during 2nd pregnancy          09/22/2022    12:04 PM 10/20/2022     8:26 AM 10/22/2022    11:08 AM 10/24/2022    12:04 PM 11/24/2022     8:23 AM 01/05/2023    10:57 AM 01/08/2023     1:05 PM   Vitals   Systolic 104 106  103 102  114    Diastolic 65 73  71 72 68    Pulse 81 81   64 69    Temp 35.9 C (96.6 F) 36.3 C (97.3 F)    36.4 C (97.5 F)    Resp 18 18    18     Height (in)   1.549 m 1.549 m   1.549 m   Weight (lb) 178.5 173.4 170 171 173.6 170.2 170   SpO2 98 % 97 %    100 %    BMI 33.27 kg/m2 32.32 kg/m2 32.12 kg/m2 32.31 kg/m2 32.8 kg/m2 32.16 kg/m2 32.14 kg/m2   BSA (m2) 1.87 m2 1.85 m2 1.82 m2 1.83 m2 1.84 m2 1.82 m2 1.82 m2   Visit Report Report Report  Report Report Report         Diet Recall / Patterns:  Wake time:   B (6:30am): 2 slices 70 kcals Dave's Killer bread + pb + coffee w/ creamer or breakfast shake (oat milk, protein powder, berries, oats, tsp meringue) 1 day/week   Sn (9:30am): KIND bar or pretzels or sun butter on 2 wasa crackers   L (~12:30pm): 1/2 reuben sandwich (takeout) or dinner leftovers or hummus + veggies + crackers   Sn: candies + cookies   D (5:30pm): chx + vegetable + sweet potato or meatloaf or takeout 1 day/week (salad w/ grilled chx, fish tacos)   Sn: ice cream on Saturday's with  kids or chips or skips   Bed time:  Fluids: coffee w/ creamer, water (80 oz+/day), iced tea/soda if out to eat  ETOH intake: special occasions/socially   Preferences: Likes: Dislikes:  Missed meals: N   Eating out: Rarely / few times per month 1-2 days/week   Eating speed: Medium  Shopping habits:  Self monitoring: N     GI Issues: N -pt states she had GERD during pregnancies     Physical Activity: Job is very physical but none at this time outside of work. Pt states she walks anywhere from 10,000-20,000 steps while at work 5 days/week. Used to go gym to do water aerobics 1 day/week and would use weight machines 1 day/week, pt no longer has gym membership.     Supplements/Vitamins/Minerals:  breakfast shake (oat milk, protein powder, berries, oats, tsp meringue)   KIND bar     Visit Notes:  Pt presents for an initial nutrition visit. Noted PMHx significant for hyperlipidemia, PCOS, GERD when pregnant, and vitamin D deficiency. Has been taking metformin since PCOS dx in 2012. Says she has been struggling to tolerate metformin, as it makes her feel nauseous. Also says she's looking forward to meeting with MD Dr. Henrene Hawking on 05/09/23, as her current Endocrinologist does not feel comfortable prescribing pt another weight loss medication. Biggest challenges are sugar cravings, snacking on candy and cookies in the afternoons, incorporating physical activity outside of work, and making quick meals for dinner that are also nutrient dense. Pt may focus on packing a snack from home vs choosing candy/cookies at work, incorporating body weight exercises at home 2-3 days/week, and keeping a written food log to increase mindfulness and promote weight loss.       Assessment:    Nutrition needs:  Weight Used for Equation Calculations: 77.1 kg  IBW (kg) (Calculated) : 47.76 kg  Mifflin- St. Jeor Equation: 1363     Estimated Protein Needs  Method for Estimating Needs: 0.8 g/kg/day actual BW  Total Protein Estimated Needs : 62  grams/day  Fluids: at least 64 oz./day    Progress Since Last Visit:  N/A    Nutrition Diagnosis:  1. Overweight / obesity related to hx of excess calorie intake, behavioral and environmental factors, and/or genetic factors as evidenced by BMI > 30      Interventions:   Motivational interviewing, Nutrition counseling, Self monitoring, Goal setting, General healthful diet    General plan:   MWL: Create balanced high protein, low fat, low sugar meals that meet designated calorie deficit and protein targets. Include 64+ fl oz of calorie-free fluids. Aim to eat every 3-4 hours with 3 planned meals and 1-2 snacks as needed starting within 1-2 hours of waking. Avoid skipping meals whenever possible. Start keeping self monitoring logs and bring to future appointments.  Practice eating mindfully and slowly, paying attention to hunger and satiety cues. Follow up with [facility] and Medical MD prn.    Recommendations / Goals:  1. Start food logging to track energy intake (protein) and to increase mindfulness. Encouraged pt share logs with RD via email to ensure pt meeting nutrition needs, to provide support, and to provide feedback.   2. Encouraged pt to pack an afternoon snack from home vs choosing candy/cookies at work. Rec'd pt choose a snack that is high in protein and fiber i.e. KIND bar + fruit or pretzels + pb or hummus + Wasa crackers + hb eggs, etc.   3. Increase physical activity. Rec'd pt incorporate body weight strength exercises 2-3 days/week to support LBM building/maintenance and weight loss/maintenance. Pt has a goal to do these exercises while watching TV on weekends and Monday nights.     Handouts: Protein Content of Foods List, Plate Model, Balanced Meal Matrix / sample menus, Quick Go To Meal Ideas, Food Logging and Other: MWL PRG, exercise     Monitoring/Evaluation:     Weigh checks, self-monitoring, structured eating, interdisciplinary visits    Progress: Ready to make changes to diet and lifestyle        Visit Information / Follow Up:  4-6 weeks    Aris Lot, RD

## 2023-02-10 ENCOUNTER — Telehealth
Admit: 2023-02-10 | Discharge: 2023-02-10 | Payer: BLUE CROSS/BLUE SHIELD | Attending: Registered" | Primary: Family Medicine

## 2023-02-10 DIAGNOSIS — K219 Gastro-esophageal reflux disease without esophagitis: Secondary | ICD-10-CM

## 2023-02-10 NOTE — Progress Notes (Signed)
[facility]  [address]  [place] [address]  [phone]         Outpatient Nutrition Follow Up  Name: Paige [name]  MRN: [id number]  DOB: Nov 12, 1980    Date Seen: 02/10/2023        Paige [name], *     Visit Type: Telemedicine    This real-time, interactive virtual Telehealth encounter was done by video with the patient's verbal consent. Two patient identifiers were used and confirmed. Physical location of the patient: home Patient resides in: [place]  Physical location of the provider: home. Other participants/involvement: none  Time start: 8:30am Time end: 8:56am    Pt Paige [name] is a 42 y.o. female is here today for a nutrition visit for the Medical Weight Loss program.    Past Medical History:  Patient Active Problem List   Diagnosis   . Encounter for gynecological examination (general) (routine) without abnormal findings   . Eczema   . Allergy, unspecified, initial encounter   . Hyperlipidemia   . Vitamin D deficiency   . History of gestational diabetes   . Vesicular skin lesions   . Post-nasal drip   . Obesity   . Melanocytic nevi, unspecified   . Polycystic ovarian syndrome   . Gastroesophageal reflux disease   . Diet controlled gestational diabetes mellitus (GDM) in third trimester (HHS-HCC)   . Mycobacteria, atypical   . Skin lesion of breast   . Skin lesion of left leg     Past Medical History:   Diagnosis Date   . Diabetes mellitus (CMS-HCC) (HHS-HCC) 3017   . STD (sexually transmitted disease) 2005   . Tinea corporis 2006       No Known Allergies    Pertinent Medications:  Current Outpatient Medications   Medication Instructions   . atorvastatin (LIPITOR) 20 mg, oral, Daily, To lower cholesterol   . azithromycin (ZITHROMAX) 500 mg, oral, Daily   . CALCIUM CARBONATE ORAL 1 tablet, oral, Daily   . cholecalciferol (vitamin D3) 2,000 Units, oral, Daily RT   . metFORMIN XR (GLUCOPHAGE-XR) 500 mg, oral, 2 times daily, Do not crush, chew, or split.   .  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   Feels safe at home: Yes  Supports: Yes   Barriers to learning: None     Weight History:  Program start wt: 170 lbs :  Highest wt:  lbs   Goal wt:   lbs  Why wt loss now: Wants to feel better and improve health (high cholesterol) for herself and children   Family hx:  Onset of wt change: mid-20s and having first child   Wt loss attempts: Plant based diet, food logging when dx'd with gestational DM during 2nd pregnancy          10/20/2022     8:26 AM 10/22/2022    11:08 AM 10/24/2022    12:04 PM 11/24/2022     8:23 AM 01/05/2023    10:57 AM 01/08/2023     1:05 PM 02/10/2023     8:32 AM   Vitals   Systolic 106  103 102 114  Diastolic 73  71 72 68     Pulse 81   64 69     Temp 36.3 C (97.3 F)    36.4 C (97.5 F)     Resp 18    18     Height (in)  1.549 m 1.549 m   1.549 m 1.549 m   Weight (lb) 173.4 170 171 173.6 170.2 170 166   SpO2 97 %    100 %     BMI 32.32 kg/m2 32.12 kg/m2 32.31 kg/m2 32.8 kg/m2 32.16 kg/m2 32.14 kg/m2 31.38 kg/m2   BSA (m2) 1.85 m2 1.82 m2 1.83 m2 1.84 m2 1.82 m2 1.82 m2 1.8 m2   Visit Report Report  Report Report Report          Diet Recall / Patterns:  Wake time:   B (6:30am): 2 slices 70 kcals Dave's Killer bread + pb + coffee w/ creamer or breakfast shake (oat milk, protein powder, berries, oats, tsp meringue) 1 day/week OR 1 cup high fiber cereal + apple   Sn (9:30am): KIND bar or pretzels or sun butter on 2 wasa crackers OR apple + cheese stick   L (~12:30pm): 1/2 reuben sandwich (takeout) or dinner leftovers or hummus + veggies + crackers   Sn: candies + cookies   D (5:30pm): chx + vegetable + sweet potato or meatloaf or takeout 1 day/week (salad w/ grilled chx, fish tacos)   Sn: ice  cream on Saturday's with kids or chips or skips   Bed time:  Fluids: coffee w/ creamer, water (80 oz+/day), iced tea/soda if out to eat  ETOH intake: special occasions/socially   Preferences: Likes: Dislikes: milk over cereal   Missed meals: N   Eating out: Rarely / few times per month 1-2 days/week   Eating speed: Medium  Shopping habits: online, Friday-Saturday   Self monitoring: N     GI Issues: N -pt states she had GERD during pregnancies     Physical Activity: Job is very physical but none at this time outside of work. Pt states she walks anywhere from 10,000-20,000 steps while at work 5 days/week. Used to go gym to do water aerobics 1 day/week and would use weight machines 1 day/week, pt no longer has gym membership.     Supplements/Vitamins/Minerals:  breakfast shake (oat milk, protein powder, berries, oats, tsp meringue)   KIND bar     Visit Notes:  Pt presents for a follow up nutrition visit. Doing well, weight trending down. Food logging in notes app on phone, pt shared logs with RD prior to visit to day via e-mail. Based on food logs, pt eating 3 meals/day, although not pairing protein rich foods at all meals. Breakfast is 1 cup high fiber cereal with an apple. Lunch is a salad kit with a cookie. Dinner is beef, starch, veggie and dessert. Snacks include candy, hummus with chips/crackers, apples and cheese sticks. Says she's trying to leave hummus, crackers and tortilla chips at work to reduce candy in afternoons. Has noticed when she does not pack food for an afternoon her candy intake increases significantly. Also says she has realized through logging that she's eating more red meat than she would like. Pt may focus on increasing protein, reducing red meat intake, and food logging to increase mindfulness and promote further weight loss.     Noted PMHx significant for hyperlipidemia, PCOS, GERD when pregnant, and vitamin D deficiency. Has been taking metformin since PCOS dx in 2012. Says she  has been  struggling to tolerate metformin, as it makes her feel nauseous. Also says she's looking forward to meeting with MD Dr. Henrene Hawking on 05/09/23, as her current Endocrinologist does not feel comfortable prescribing pt another weight loss medication.     Assessment:    Nutrition needs:  Weight Used for Equation Calculations: 75.3 kg  IBW (kg) (Calculated) : 47.76 kg  Mifflin- St. Jeor Equation: 1345     Estimated Protein Needs  Method for Estimating Needs: 0.8 g/kg/day actual BW  Total Protein Estimated Needs : 60 grams/day     Fluids: at least 64 oz./day    Progress Since Last Visit:  Trying to plan more when at work to reduce snacking on candy.     Nutrition Diagnosis:  1. Overweight / obesity related to hx of excess calorie intake, behavioral and environmental factors, and/or genetic factors as evidenced by BMI > 30      Interventions:   Motivational interviewing, Nutrition counseling, Self monitoring, Goal setting, General healthful diet    General plan:   MWL: Create balanced high protein, low fat, low sugar meals that meet designated calorie deficit and protein targets. Include 64+ fl oz of calorie-free fluids. Aim to eat every 3-4 hours with 3 planned meals and 1-2 snacks as needed starting within 1-2 hours of waking. Avoid skipping meals whenever possible. Start keeping self monitoring logs and bring to future appointments.  Practice eating mindfully and slowly, paying attention to hunger and satiety cues. Follow up with [facility] and Medical MD prn.    Recommendations / Goals:  1. Reviewed food logs with pt during visit. Provided feedback and suggestions for improvements. Encouraged pt to continue to track energy intake and to increase mindfulness.   2. Emphasized the importance of eating every few hours and meeting protein needs to facilitate weight loss. Explained that per 24 hr diet recall, pt likely not meeting daily protein goal of at least 60 grams/day. Encouraged pt to increase protein at breakfast with 2 hb eggs  or homemade protein shake w/ protein powder. Also encouraged pt add more protein to salads at lunch via chx, cheese + beans + nuts, etc.   3. Encouraged pt to continue pack an afternoon snack from home vs choosing candy/cookies at work, as food logs show that when pt does not pack afternoon snack then candy intake increases significantly.   4. Pt has a goal to reduce red meat intake down to 1-2 days/week.     Handouts: None MWL PRG, exercise     Monitoring/Evaluation:     Weigh checks, self-monitoring, structured eating, interdisciplinary visits    Progress: Actively working on nutrition goals and lifestyle change       Visit Information / Follow Up:  6-8 weeks    Aris Lot, RD

## 2023-02-18 ENCOUNTER — Telehealth
Admit: 2023-02-18 | Discharge: 2023-02-18 | Payer: BLUE CROSS/BLUE SHIELD | Attending: Internal Medicine | Primary: Family Medicine

## 2023-02-18 DIAGNOSIS — E282 Polycystic ovarian syndrome: Secondary | ICD-10-CM

## 2023-02-18 MED ORDER — metFORMIN XR (Glucophage-XR) 500 mg 24 hr tablet
500 | ORAL_TABLET | Freq: Every day | ORAL | 3 refills | Status: AC
Start: 2023-02-18 — End: ?

## 2023-02-18 NOTE — Progress Notes (Signed)
[facility]  [address]  [place] [address]  Dept Phone: [phone]  Dept Fax: [phone]     Endocrinology Clinic Note    Referring Physician: Frances Furbish, MD      History of present illness:  Paige [name] is a 42 y.o. female  PMH of PCOS,HL here for f/u of GDM    menses regular, not losing any weight, got diarrhea and nausea with regular metformin. Seeing RD. currently on metformin 500 mg XR tolerating this fine A1c is normal at 5.5%    LDL very high 174 but now down to 194 ,PCP started atorvastatin 20 mg/d    Prior to this I have seen her in 2019 with history of gestational diabetes.  Was on metformin in the first trimester, then gestational diabetes was diet controlled    42 y/o boy-7lb 1 ounce, was born in [place], no GDM then but was on metformin until 14 weeks and was on metformin even in this pregnancy until 14 weeks and was stopped by Dr Dorna Bloom    42 y/o boy- 6lb 12 ounces-GDM, diet controlled      PCOS Diagnosed 8 years ago based on oligomenorrhea, no acne/hirsuitism-clomiphene/IUI second preg first pregnancy she lost the bay at 9 weeks,this preg-no clomiphene but was on metformin. 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,married      Assessment/Plan:  Paige [name] is a 42 y.o. female G4P2 PMH of PCOS,HL here for f/u of GDM    H/o Gestational diabetes: last a1c ok 5.5%    PCO S: On slow release metformin 500 mg XR daily if tolerating well , A1c is normal always take metformin with food to minimize risk of side effects.   menses are regular, not planning another pregnancy.  She is interested in medical weight loss [facility].  We will redo her testosterone level and A1c in 4 months    LDL up again, breast feeding, strong family history of hypercholesterolemia. LDL very high 174 but now down to 194 ,PCP started atorvastatin 20 mg/d    She prefers video visits for  now,follow-up in 4 months with labs    Thank you for involving in this patient's care. Please do not hesitate to contact with questions or concerns         Rosina Lowenstein, MD         Pertinent lab review:    Lab Results   Component Value Date    HGBA1C 5.5 07/28/2022      Lab Results   Component Value Date    GLUCOSE 90 01/05/2023    CALCIUM 9.6 01/05/2023    NA 137 01/05/2023    K 3.8 01/05/2023    CO2 25 01/05/2023    CL 102 01/05/2023    BUN 15 01/05/2023    CREATININE 0.74 01/05/2023      Lab Results   Component Value Date    LDLCALC 174 (H) 10/20/2022      Lab Results   Component Value Date    HGBA1C 5.5 07/28/2022     Lab Results   Component Value Date    GLUF 82 03/06/2021    LDLCALC 174 (H) 10/20/2022    CREATININE 0.74 01/05/2023        Lab Results   Component Value Date    ALT 20 01/05/2023    AST 27 01/05/2023  ALKPHOS 86 01/05/2023    BILITOT 0.7 01/05/2023                Medical History:    Past Medical History:   Diagnosis Date    Diabetes mellitus (CMS-HCC) (HHS-HCC) 3017    STD (sexually transmitted disease) 2005    Tinea corporis 2006         Surgical History:    Past Surgical History:   Procedure Laterality Date    D&C FIRST TRIMESTER / TX INCOMPLETE / MISSED / SEPTIC / INDUCED ABORTION N/A 2013    arcuate uterus          Family History:    @BIMFAMHISTORY @       Social History:    No valid surgical or medical questions entered.        Active medical problems:    Patient Active Problem List   Diagnosis    Encounter for gynecological examination (general) (routine) without abnormal findings    Eczema    Allergy, unspecified, initial encounter    Hyperlipidemia    Vitamin D deficiency    History of gestational diabetes    Vesicular skin lesions    Post-nasal drip    Obesity    Melanocytic nevi, unspecified    Polycystic ovarian syndrome    Gastroesophageal reflux disease    Diet controlled gestational diabetes mellitus (GDM) in third trimester (HHS-HCC)    Mycobacteria, atypical    Skin lesion of  breast    Skin lesion of left leg         Current medications:      Current Outpatient Medications:     atorvastatin (Lipitor) 20 mg tablet, Take 1 tablet (20 mg) by mouth once daily. To lower cholesterol, Disp: 90 tablet, Rfl: 1    azithromycin (Zithromax) 500 mg tablet, Take 1 tablet (500 mg) by mouth once daily., Disp: 30 tablet, Rfl: 2    CALCIUM CARBONATE ORAL, Take 1 tablet by mouth in the morning., Disp: , Rfl:     cholecalciferol, vitamin D3, 50 mcg (2,000 unit) tablet,chewable, Chew 2,000 Units in the morning., Disp: , Rfl:     metFORMIN XR (Glucophage-XR) 500 mg 24 hr tablet, Take 1 tablet (500 mg) by mouth twice daily. Do not crush, chew, or split., Disp: 180 tablet, Rfl: 3    rifAMPin (Rifadin) 300 mg capsule, Take 2 capsules (600 mg) by mouth once daily., Disp: 60 capsule, Rfl: 2       Allergies:    No Known Allergies         ROS:    No palpitations, no tremor, no heat intolerance. No cold intolerance.  No diarrhea or constipation. No change in hair or skin. No dysphagia, no odynophagia, no solid food or pill dysphagia. No diplopia/blurry vision.No fevers/chills.No change in voice.No anterior neck pressure. No increase in neck size.No wheeze or DOE.No rash.No edema. No cough.No anxiety/Depression.Regular menses Rest of ROS negative or non-contributory.       Physical Exam:    There were no vitals taken for this visit.   ZOX:WRUEA alert no acute distress  HEENT: head atraumatic, normocephalic, no redness in eyes no discharge from nose  SKIN: facial skin does not show any cyanosis  RESP: speaking in full sentences, no increased respiratory effort seen  MSK: normal ROM in fingers  Neuro: facial nerves intact  Pysch: appearance, behavior and attitude-pleasant, cooperative and groomed        This real-time, interactive virtual Telehealth encounter was done  by video  with the patient's verbal consent. Two patient identifiers were used and confirmed. Physical location of the patient: home  Patient resides in:  [place]  Physical location of the provider: office  Other participants/involvement: none        This note has been created with voice recognition software.  While this note has been edited for accuracy, this software periodically misinterprets speech resulting in errors that might not have been caught in editing.  In the event an unusual error is found in this record, please notify us at  [phone] to resolve the error.

## 2023-02-23 NOTE — Telephone Encounter (Signed)
From: [name]  To: Rolm Bookbinder, NP  Sent: 02/23/2023 5:46 PM EDT  Subject: Cholesterol check     Hello,  When should I get my cholesterol checked again? I am getting bloodwork on June 24 if you want me to check it then  Thank you

## 2023-02-27 NOTE — Telephone Encounter (Signed)
From: [name]  To: Dr. Ria Bush, MD  Sent: 02/27/2023 2:13 PM EDT  Subject: Finger picture    Hi dr Eugene Garnet, I know I will see you in a few weeks but I wanted to let you know the appearance of the infection Has changed slightly. It looks almost scaly and dry. Not worse, just different. I have attached a couple of pictures.

## 2023-03-09 ENCOUNTER — Ambulatory Visit
Admit: 2023-03-09 | Discharge: 2023-03-09 | Payer: BLUE CROSS/BLUE SHIELD | Attending: Pediatrics | Primary: Family Medicine

## 2023-03-09 ENCOUNTER — Ambulatory Visit: Admit: 2023-03-09 | Discharge: 2023-03-09 | Payer: BLUE CROSS/BLUE SHIELD | Primary: Family Medicine

## 2023-03-09 ENCOUNTER — Ambulatory Visit: Payer: BLUE CROSS/BLUE SHIELD | Attending: Pediatrics | Primary: Family Medicine

## 2023-03-09 DIAGNOSIS — E782 Mixed hyperlipidemia: Secondary | ICD-10-CM

## 2023-03-09 LAB — CBC WITH DIFFERENTIAL
Basophils %: 0.3 %
Basophils Absolute: 0.03 10*3/uL (ref 0.00–0.22)
Eosinophils %: 0.9 %
Eosinophils Absolute: 0.08 10*3/uL (ref 0.00–0.50)
Hematocrit: 41.4 % (ref 32.0–47.0)
Hemoglobin: 14.1 g/dL (ref 11.0–16.0)
Immature Granulocytes %: 0.2 %
Immature Granulocytes Absolute: 0.02 10*3/uL (ref 0.00–0.10)
Lymphocyte %: 37.2 %
Lymphocytes Absolute: 3.38 10*3/uL (ref 0.70–4.00)
MCH: 29.5 pg (ref 26.0–34.0)
MCHC: 34.1 g/dL (ref 31.0–37.0)
MCV: 86.6 fL (ref 80.0–100.0)
MPV: 9.6 fL (ref 9.1–12.4)
Monocytes %: 5 %
Monocytes Absolute: 0.45 10*3/uL (ref 0.36–0.77)
NRBC %: 0 % (ref 0.0–0.0)
NRBC Absolute: 0 10*3/uL (ref 0.00–2.00)
Neutrophil %: 56.4 %
Neutrophils Absolute: 5.12 10*3/uL (ref 1.50–7.95)
Platelets: 229 10*3/uL (ref 150–400)
RBC: 4.78 M/uL (ref 3.70–5.20)
RDW-CV: 12.8 % (ref 11.5–14.5)
RDW-SD: 40.2 fL (ref 35.0–51.0)
WBC: 9.1 10*3/uL (ref 4.0–11.0)

## 2023-03-09 LAB — COMPREHENSIVE METABOLIC PANEL
ALT: 18 U/L (ref 0–55)
AST: 25 U/L (ref 6–42)
Albumin: 4.6 g/dL (ref 3.2–5.0)
Alkaline phosphatase: 81 U/L (ref 30–130)
Anion Gap: 10 mmol/L (ref 3–14)
BUN: 14 mg/dL (ref 6–24)
Bilirubin, total: 0.5 mg/dL (ref 0.2–1.2)
CO2 (Bicarbonate): 25 mmol/L (ref 20–32)
Calcium: 9.7 mg/dL (ref 8.5–10.5)
Chloride: 102 mmol/L (ref 98–110)
Creatinine: 0.84 mg/dL (ref 0.55–1.30)
Glucose: 87 mg/dL (ref 70–139)
Potassium: 3.7 mmol/L (ref 3.6–5.2)
Protein, total: 7.7 g/dL (ref 6.0–8.4)
Sodium: 137 mmol/L (ref 135–146)
eGFRcr: 88 mL/min/{1.73_m2} (ref >=60)

## 2023-03-09 LAB — LIPID PANEL
Cholesterol/HDL Ratio: 5
Cholesterol: 238 mg/dL — ABNORMAL HIGH (ref <=200)
HDL cholesterol: 48 mg/dL (ref >=40)
LDL cholesterol, calculated: 161 mg/dL — ABNORMAL HIGH (ref 0–130)
Non-HDL Calculation: 190 mg/dL — ABNORMAL HIGH (ref <160)
Triglycerides: 143 mg/dL (ref <=150)

## 2023-03-09 MED ORDER — atorvastatin (Lipitor) 40 mg tablet
40 | ORAL_TABLET | Freq: Every day | ORAL | 1 refills | 90.00000 days | Status: AC
Start: 2023-03-09 — End: 2023-09-05

## 2023-03-09 NOTE — Result Quicknote (Signed)
On Rifampin another 8 weeks, increased to 40mg  atorvastatin fu for lipids after

## 2023-03-09 NOTE — Progress Notes (Signed)
[facility]  [address]  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 She presents today for follow-up of treatment of the Paige [name] lesion after completing about 7 months total of therapy.    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 She originally noticed a violaceous lesion on the dorsum of her right 4th finger around May 2021. An x-ray at the time showed a 3 mm metallic foreign body, which ended up being a very small metal splinter that spontaneously came out with removal of a scab. She underwent wound exploration and biopsy, but no additional foreign body was found and pathology reportedly showed chronic inflammation. She was treated with intermittent doxycycline with waxing/waning appearance of the lesion. In March 2022, she was seen by Dr. 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 She was seen by Dr. Margy Clarks from Infectious Diseases in May and October 2022, but treatment for the Paige [name] skin infection was deferred given that she was pregnant with twins and then breastfeeding two infants. The skin lesion improved somewhat during pregnancy, but flared again in late 2023, causing her to re-present to Dermatology in October 2023. On 07/10/22, she was seen by Dr. Jake Bathe and started on clarithromycin 500 mg PO BID. She had some improvement on  clarithromycin monotherapy with lightening of the lesion. On 07/28/22, I saw her in ID clinic and recommended switching to dual therapy with azithromycin and rifampin given higher chance of cure than with monotherapy. Her baseline labs and EKG were normal. Monitoring labs have been normal over the last five months.    Interval History  Paige [name] continues to take azithromycin, rifampin, and atorvastatin first thing in the morning on an empty stomach and reports feeling well. She has not had any fatigue, anorexia, abdominal pain, significant nausea, vomiting, fever, chills, vision changes, easy bruising, gum bleeding, or rashes. About two weeks ago, she noticed that the lesion on her right finger had become somewhat more rough and patchy-appearing. The area has not become more raised, painful, or erythematous. The color appears to be lightening slightly. She has not had any bleeding or drainage from the lesion. She has not had any new skin lesions.    Her monitoring labs today were normal, including: normal renal function, normal electrolytes, normal liver function, normal blood counts. Her cholesterol was checked by her PCP and found to be persistently elevated with total cholesterol 238, HDL 48, LDL 161. She was recommended to increase atorvastatin to 40 mg daily.    Review of Systems   Pertinent positives and negatives as per HPI, otherwise a 10 point ROS was negative.    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 She works as a Garment/textile technologist at [employer]. No history of tobacco, alcohol, or substance use.    Family History  Not contributory to this consult.    Allergies  Patient has no  known allergies.    Current Medications  Current Outpatient Medications   Medication Sig Dispense Refill    azithromycin (Zithromax) 500 mg tablet Take 1 tablet (500 mg) by mouth once daily. 30 tablet 2    CALCIUM CARBONATE ORAL Take 1 tablet by mouth in the morning.      cholecalciferol, vitamin D3, 50 mcg (2,000 unit) tablet,chewable Chew 2,000 Units in the morning.      metFORMIN XR (Glucophage-XR) 500 mg 24 hr tablet Take 1 tablet (500 mg) by mouth with evening meal. Do not crush, chew, or split. 180 tablet 3    rifAMPin (Rifadin) 300 mg capsule Take 2 capsules (600 mg) by mouth once daily. 60 capsule 2    atorvastatin (Lipitor) 40 mg tablet Take 1 tablet (40 mg) by mouth once daily. To lower cholesterol 90 tablet 1     No current facility-administered medications for this visit.       Physical Exam:  Vitals: Blood pressure 108/66, pulse 82, temperature 36.4 C (97.6 F), temperature source Temporal, resp. rate 18, weight 77.2 kg, last menstrual period 02/27/2023, SpO2 98%, not currently breastfeeding.   Gen: Well-appearing woman, alert and conversant.   HEENT: EOMI, no scleral icterus.  Chest: CTAB.  Cardiac: RRR, nl S1 S2, no murmurs.  Abdomen: Soft, non-tender, non-distended.  Neuro: A&O x 3, normal gait, moving all extremities.  Upper Extremities: R 4th finger with a slowly improving violaceous plaque over the dorsal surface of the proximal phalanx and PIP joint. Compared to her last visit, the lesion is slightly more rough, but remains flat (no nodularity) with slight decrease in the density of the pigmentation. There are no subcutaneous nodules or lymphadenopathy.  Skin: No rashes or lesions apart from the right 4th finger lesion.    03/09/23:            02/27/23:      01/05/23:      11/24/22:      10/20/22:      09/22/22:      08/25/22:      07/28/22:      07/10/22 ([facility]):        Labs  Lab Results   Component Value Date    WBC 9.1 03/09/2023    HGB 14.1 03/09/2023    MCV 86.6 03/09/2023    PLT 229 03/09/2023           Chemistry    Lab Results   Component Value Date/Time    NA 137 03/09/2023 1204    K 3.7 03/09/2023 1204    CL 102 03/09/2023 1204    CO2 25 03/09/2023 1204    BUN 14 03/09/2023 1204    CREATININE 0.84 03/09/2023 1204    Lab Results   Component Value Date/Time    CALCIUM 9.7 03/09/2023 1204    ALKPHOS 81 03/09/2023 1204    AST 25 03/09/2023 1204    ALT 18 03/09/2023 1204    BILITOT 0.5 03/09/2023 1204        Microbiology  Wound:  11/29/20 R 4th finger wound biopsy: Culture grew Mycobacterium  [name].      Pathology  11/29/20 R 4th finger punch biopsy:  A. RIGHT DORSAL 4TH FINGER, PUNCH BS   DENSE, WELL-FORMED DERMAL GRANULOMATOUS DERMATITIS (SEE COMMENT).   COMMENT: The findings are concerning for an infectious etiology. However, no fungal   organisms are identified with repeat PAS stains and repeat gram and AFB stains are negative   for pathogenic bacterial organisms. Tissue culture is recommended. Multiple levels examined.        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 She presents today for follow-up of treatment of the Paige [name] lesion after completing about 7 months total of therapy.    Paige [name] right 4th finger lesion has improved significantly after ~7 months of treatment, with resolution of the nodular aspect and ulcerated areas. The hyperpigmentation has been slow to resolve completely, unclear if due to scarring vs persistent active infection. The infection may have been deeper than initially expected given the fact that she developed the finger lesion ~2.5 years prior to starting treatment in 06/2022. We had previously discussed potentially stopping treatment at this visit given the relative  stagnation in her improvement over the last couple of months and the possibility that the residual hyperpigmentation could just be due to scarring. However, I am encouraged by the fact that the lesion appears lighter in color and more patchy today compared to her last visit, which suggests continued benefit from antimicrobials. I had a risk/benefit discussion with Paige [name] regarding continuing antimicrobial therapy for at least another 2 months vs stopping and monitoring for recurrence. She is still well within the range of treatment for Paige [name] SSTIs of moderate severity (treatment duration of 3-4 months to >12 months is documented in the literature). Given that she is tolerating treatment without side effects and with normal monitoring labs, she opted to continue antimicrobials for another 6-8 weeks, then reassess in clinic. In the unlikely event that the skin lesion worsens, we discussed that she would likely need repeat debridement/biopsy with culture.    Diagnostics:  - CBC/d and CMP were normal today; will repeat labs in 8 weeks  - EKG on 07/28/22 showed a QTc of 446    Therapeutics:  - Continue azithromycin 500 mg PO daily  - Continue rifampin 600 mg PO daily taken first thing in the morning on an empty stomach  - Continue taking atorvastatin at the same time as rifampin to minimize drug-drug interactions    MIPS  Last hemoglobin A1c: 5.5%  - Does patient had a history of hypertension: no  - Does patient have a history of diabetes: no     Follow Up Plan  - In person visit with me on 04/20/23 with labs beforehand on the same day    Paige [name], PaigeD.  Infectious Diseases Attending

## 2023-03-15 MED ORDER — rifAMPin (Rifadin) 300 mg capsule
300 | ORAL_CAPSULE | Freq: Every day | ORAL | 2 refills | 30.00000 days | Status: DC
Start: 2023-03-15 — End: 2023-05-10

## 2023-03-15 MED ORDER — azithromycin (Zithromax) 500 mg tablet
500 | ORAL_TABLET | Freq: Every day | ORAL | 2 refills | Status: DC
Start: 2023-03-15 — End: 2023-05-10

## 2023-04-02 NOTE — Telephone Encounter (Signed)
Department Name: PCP    Patient: Paige [name]  MRN: [id number]  Agent: Loetta Rough [facility] Scheduling Message    Patient calling stating: patient has foot pain when stand up / walk       Call transferred to Nurse Triage.     PCP: Frances Furbish     Call back number: [phone]

## 2023-04-02 NOTE — Telephone Encounter (Signed)
Patient calling with c/o b/l moderate foot pain on the bottoms of her feet x1 year, progressively worsening. Walking/standing exacerbates the pain but she is able to walk. 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 Denies any injury to feet/ankles, swelling or warmth of feet/ankle/legs, cyanotic feet, severe pain, numbness in feet or inability to walk at all. Per patient request and disposition, scheduled for office visit tomorrow for further evaluation.    Reason for Disposition   MODERATE pain (e.g., interferes with normal activities, limping) and present > 3 days    Protocols used: Foot Pain-A-OH    Reason for Call:   Reason for Call            Foot Pain since 03/26/2022            Disposition       See Within 3 Days in Office         What would patient/caregiver have done without intervention? Made an Appointment    Patient/Caregiver understands and will follow disposition? Yes          Boris Lown, RN  Nurse Triage   [facility]

## 2023-04-03 ENCOUNTER — Encounter: Payer: BLUE CROSS/BLUE SHIELD | Attending: Pediatrics | Primary: Family Medicine

## 2023-04-09 ENCOUNTER — Ambulatory Visit
Admit: 2023-04-09 | Discharge: 2023-04-09 | Payer: BLUE CROSS/BLUE SHIELD | Attending: Adult Health | Primary: Family Medicine

## 2023-04-09 ENCOUNTER — Inpatient Hospital Stay: Admit: 2023-04-09 | Payer: BLUE CROSS/BLUE SHIELD | Primary: Family Medicine

## 2023-04-09 DIAGNOSIS — M79672 Pain in left foot: Secondary | ICD-10-CM

## 2023-04-09 MED ORDER — ibuprofen 800 mg tablet
800 | ORAL_TABLET | Freq: Three times a day (TID) | ORAL | 5 refills | Status: AC | PRN
Start: 2023-04-09 — End: 2024-04-08

## 2023-04-09 NOTE — Patient Instructions (Signed)
Xray left foot possible bone spurs  Ice three times per day  Ibuprofen 800 mg three times per day  Gentle stretching of feet  Support shoe at all times. Heel cushions for feet  If symptoms do not improve   Dr Evelena Asa Podiatry [phone]

## 2023-04-09 NOTE — Progress Notes (Signed)
[facility]  [facility]  [address]  [address]  [place] [address]  Dept: [phone]  Dept Fax: [phone]     Patient ID: Paige [name] is a 42 y.o. female who presents for Urgent Visit.    Subjective  Bilateral foot pain  HPI HX foot pain x 1 yr getting worse. Is a Medical sales representative  notes pain is worse in early am and end of day and focal to heels    Review of Systems   Constitutional: Negative.    Musculoskeletal:         Bilateral heel pain         Objective   Visit Vitals  BP 109/62   Pulse 79   Ht 1.549 m   Wt 75.8 kg   SpO2 97%   BMI 31.55 kg/m   OB Status Having periods   BSA 1.81 m       Physical Exam  Constitutional:       Appearance: She is obese.   Musculoskeletal:      Comments: Poor arches noted on both feet. Palpable pain L>R heel no deformities or redness noted   Neurological:      Mental Status: She is alert.         Assessment/Plan   Paige [name] was seen today for urgent visit.  Foot pain, left  -     XR FOOT LEFT 1-2 VIEWS; Future  -     ibuprofen 800 mg tablet; Take 1 tablet (800 mg) by mouth if needed in the morning, at noon, and at bedtime for pain score 1-3 (pain).  Foot pain, bilateral  Assessment & Plan:  Xray left foot possible bone spurs  Ice three times per day  Ibuprofen 800 mg three times per day  Gentle stretching of feet  Support shoe at all times. Heel cushions for feet  If symptoms do not improve   Dr Evelena Asa Podiatry [phone]

## 2023-04-09 NOTE — Assessment & Plan Note (Signed)
Xray left foot possible bone spurs  Ice three times per day  Ibuprofen 800 mg three times per day  Gentle stretching of feet  Support shoe at all times. Heel cushions for feet  If symptoms do not improve   Dr Evelena Asa Podiatry [phone]

## 2023-04-13 ENCOUNTER — Telehealth
Admit: 2023-04-13 | Discharge: 2023-04-13 | Payer: BLUE CROSS/BLUE SHIELD | Attending: Registered" | Primary: Family Medicine

## 2023-04-13 DIAGNOSIS — E282 Polycystic ovarian syndrome: Secondary | ICD-10-CM

## 2023-04-13 NOTE — Progress Notes (Signed)
[facility]  [address]  [place] [address]  [phone]         Outpatient Nutrition Follow Up  Name: Paige [name]  MRN: [id number]  DOB: November 30, 1980    Date Seen: 04/13/2023        Frances Furbish, *     Visit Type: Telemedicine    This real-time, interactive virtual Telehealth encounter was done by video with the patient's verbal consent. Two patient identifiers were used and confirmed. Physical location of the patient: home Patient resides in: [place]  Physical location of the provider: home. Other participants/involvement: none  Time start: 8:30am Time end: 8:54am    Pt Paige [name] is a 42 y.o. female is here today for a nutrition visit for the Medical Weight Loss program.    Past Medical History:  Patient Active Problem List   Diagnosis    Encounter for gynecological examination (general) (routine) without abnormal findings    Eczema    Allergy, unspecified, initial encounter    Hyperlipidemia (CMS-HCC)    Vitamin D deficiency    History of gestational diabetes    Vesicular skin lesions    Post-nasal drip    Obesity    Melanocytic nevi, unspecified    Polycystic ovarian syndrome    Gastroesophageal reflux disease    Diet controlled gestational diabetes mellitus (GDM) in third trimester    Mycobacteria, atypical    Skin lesion of breast    Skin lesion of left leg    Foot pain, bilateral     Past Medical History:   Diagnosis Date    Diabetes mellitus 3017    STD (sexually transmitted disease) 2005    Tinea corporis 2006       No Known Allergies    Pertinent Medications:  Current Outpatient Medications   Medication Instructions    atorvastatin (LIPITOR) 40 mg, oral, Daily, To lower cholesterol    azithromycin (ZITHROMAX) 500 mg, oral, Daily    CALCIUM CARBONATE ORAL 1 tablet, oral, Daily    cholecalciferol (vitamin D3) 2,000 Units, oral, Daily RT    ibuprofen 800 mg, oral, 3 times daily PRN    metFORMIN XR (GLUCOPHAGE-XR) 500 mg, oral, Daily with evening meal, Do not  crush, chew, or split.    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: Lives with husband and 4 children   Feels safe at home: Yes  Supports: Yes   Barriers to learning: None     Weight History:  Program start wt: 170 lbs :  Highest wt:  lbs   Goal wt:   lbs  Why wt loss now: Wants to feel better and improve health (high cholesterol) for herself and children   Family hx:  Onset of wt change: mid-20s and having first child   Wt loss attempts: Plant based diet, food logging when dx'd with gestational DM during 2nd pregnancy          01/05/2023    10:57 AM 01/08/2023     1:05 PM 02/10/2023     8:32 AM 02/18/2023    11:10 AM 03/09/2023    12:39 PM 04/09/2023     1:28 PM 04/13/2023  8:32 AM   Vitals   Systolic 114    108 109    Diastolic 68    66 62    Pulse 69    82 79    Temp 36.4 C (97.5 F)    36.4 C (97.6 F)     Resp 18    18     Height (in)  1.549 m 1.549 m 1.549 m  1.549 m 1.549 m   Weight (lb) 170.2 170 166 166 170.2 167 165   SpO2 100 %    98 % 97 %    BMI 32.16 kg/m2 32.14 kg/m2 31.38 kg/m2 31.37 kg/m2 32.16 kg/m2 31.55 kg/m2 31.19 kg/m2   BSA (m2) 1.82 m2 1.82 m2 1.8 m2 1.8 m2 1.82 m2 1.81 m2 1.79 m2   Visit Report Report    Report Report         Diet Recall / Patterns:  Wake time:   B (6:30am): 2 slices 70 kcals [employer] K[employer] bread + pb + coffee w/ creamer or breakfast shake (oat milk, protein powder, berries, oats, tsp meringue) 1 day/week OR 1 cup high fiber cereal + apple OR [employer] tortilla + tofu + beans + salsa (sometimes avocado & spinach)   Sn (9:30am): KIND bar or pretzels or sun butter on 2 wasa crackers OR apple + cheese stick   L (~12:30pm): 1/2 reuben sandwich (takeout) or dinner leftovers or hummus + veggies + crackers   Sn: candies + cookies OR meat chomp sticks ([place] or  chx)  D (5:30pm): chx + vegetable + sweet potato or meatloaf or takeout 1 day/week (salad w/ grilled chx, fish tacos)   Sn: ice cream on Saturday's with kids or chips or skips   Bed time:  Fluids: coffee w/ creamer, water (80 oz+/day), iced tea/soda if out to eat  ETOH intake: special occasions/socially   Preferences: Likes: Dislikes: milk over cereal   Missed meals: N   Eating out: Rarely / few times per month 1-2 days/week   Eating speed: Medium  Shopping habits: online, Friday-Saturday   Self monitoring: N     GI Issues: N -pt states she had GERD during pregnancies     Physical Activity: Job is very physical but none at this time outside of work. Pt states she walks anywhere from 10,000-20,000 steps while at work 5 days/week. Used to go gym to do water aerobics 1 day/week and would use weight machines 1 day/week, pt no longer has gym membership.     Supplements/Vitamins/Minerals:  breakfast shake (oat milk, protein powder, berries, oats, tsp meringue)   KIND bar     Visit Notes:  Pt presents for a follow up nutrition visit. Doing well, weight trending down. States she has been successful in reducing her red meat intake from several days per week to once every few weeks. Eating more plant based proteins like soy. For instance, breakfast is [employer] tortilla with tofu, beans, salsa, and often avocado and spinach. Pt continues to struggle with intake of sweets. 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 Shares she's trying to have smaller pieces of cake at parties and few pieces of candy at work. However, pt struggling when she's busy at work and she does not have time to sit down and eat her snack from home (hummus) so she grabs candy to satisfy hunger and keep her going. Pt considering packing quick,  on-the-go snacks like cheese sticks, apples and protein bars. Feels she has been more active outside of work d/t spending  more time outside with kids this summer. However, pt aware she needs to come up with a plan for fall/winter to increase physical activity.     Noted PMHx significant for hyperlipidemia, PCOS, GERD when pregnant, and vitamin D deficiency. Has been taking metformin since PCOS dx in 2012. Says she has been struggling to tolerate metformin, as it makes her feel nauseous. Also says she's looking forward to meeting with MD Dr. Henrene Hawking on 05/13/23, as her current Endocrinologist does not feel comfortable prescribing pt another weight loss medication.     Assessment:    Nutrition needs:  Weight Used for Equation Calculations: 74.8 kg  IBW (kg) (Calculated) : 47.76 kg  [place]iffl[place]- St. Jeor Equation: 1336     Estimated Protein Needs  Method for Estimating Needs: 0.8 g/kg/day actual BW  Total Protein Estimated Needs : 60 grams/day     Fluids: at least 64 oz./day    Progress Since Last Visit:  Pt continues to focus on panning meals and snacks when at work to reduce snacking on candy.     Nutrition Diagnosis:  Overweight / obesity related to hx of excess calorie intake, behavioral and environmental factors, and/or genetic factors as evidenced by BMI > 30      Interventions:   Motivational interviewing, Nutrition counseling, Self monitoring, Goal setting, General healthful diet    General plan:   MWL: Create balanced high protein, low fat, low sugar meals that meet designated calorie deficit and protein targets. Include 64+ fl oz of calorie-free fluids. Aim to eat every 3-4 hours with 3 planned meals and 1-2 snacks as needed starting within 1-2 hours of waking. Avoid skipping meals whenever possible. Start keeping self monitoring logs and bring to future appointments.  Practice eating mindfully and slowly, paying attention to hunger and satiety cues. Follow up with [facility] MD prn.    Recommendations / Goals:  Praised pt for changes she's made with red meat intake and increasing protein. Rec'd pt continue to stay on track with  dietary changes to promote further weight loss.   Increased mindfulness with sweets in social situations. Encouraged pt to ask herself several questions prior to indulging at sweets at parties: "Do I want this sweet? Will this make me feel good? Am I truly hungry or having a craving?"   Pt has a goal to pack quick, on-the-go snacks for work, such as cheese sticks, apples, and protein bars to prevent choosing candy when too busy to stop, sit and eat snack she packed from home.     Handouts: None Protein bar guidelines      Monitoring/Evaluation:     Weigh checks, self-monitoring, structured eating, interdisciplinary visits    Progress: Actively working on nutrition goals and lifestyle change       Visit Information / Follow Up:  6-8 weeks    Aris Lot, RD

## 2023-04-20 ENCOUNTER — Ambulatory Visit
Admit: 2023-04-20 | Discharge: 2023-04-20 | Payer: BLUE CROSS/BLUE SHIELD | Attending: Pediatrics | Primary: Family Medicine

## 2023-04-20 ENCOUNTER — Ambulatory Visit: Admit: 2023-04-20 | Discharge: 2023-04-20 | Payer: BLUE CROSS/BLUE SHIELD | Primary: Family Medicine

## 2023-04-20 DIAGNOSIS — E282 Polycystic ovarian syndrome: Secondary | ICD-10-CM

## 2023-04-20 LAB — CBC WITH DIFFERENTIAL
Basophils %: 0.6 %
Basophils Absolute: 0.04 10*3/uL (ref 0.00–0.22)
Eosinophils %: 2.2 %
Eosinophils Absolute: 0.16 10*3/uL (ref 0.00–0.50)
Hematocrit: 40.5 % (ref 32.0–47.0)
Hemoglobin: 14.3 g/dL (ref 11.0–16.0)
Immature Granulocytes %: 0.1 %
Immature Granulocytes Absolute: 0.01 10*3/uL (ref 0.00–0.10)
Lymphocyte %: 46 %
Lymphocytes Absolute: 3.3 10*3/uL (ref 0.70–4.00)
MCH: 30.5 pg (ref 26.0–34.0)
MCHC: 35.3 g/dL (ref 31.0–37.0)
MCV: 86.4 fL (ref 80.0–100.0)
MPV: 9.4 fL (ref 9.1–12.4)
Monocytes %: 6.1 %
Monocytes Absolute: 0.44 10*3/uL (ref 0.36–0.77)
NRBC %: 0 % (ref 0.0–0.0)
NRBC Absolute: 0 10*3/uL (ref 0.00–2.00)
Neutrophil %: 45 %
Neutrophils Absolute: 3.23 10*3/uL (ref 1.50–7.95)
Platelets: 214 10*3/uL (ref 150–400)
RBC: 4.69 M/uL (ref 3.70–5.20)
RDW-CV: 12.8 % (ref 11.5–14.5)
RDW-SD: 39.9 fL (ref 35.0–51.0)
WBC: 7.2 10*3/uL (ref 4.0–11.0)

## 2023-04-20 LAB — COMPREHENSIVE METABOLIC PANEL
ALT: 17 U/L (ref 0–55)
AST: 26 U/L (ref 6–42)
Albumin: 4.4 g/dL (ref 3.2–5.0)
Alkaline phosphatase: 86 U/L (ref 30–130)
Anion Gap: 10 mmol/L (ref 3–14)
BUN: 15 mg/dL (ref 6–24)
Bilirubin, total: 0.5 mg/dL (ref 0.2–1.2)
CO2 (Bicarbonate): 27 mmol/L (ref 20–32)
Calcium: 9.8 mg/dL (ref 8.5–10.5)
Chloride: 102 mmol/L (ref 98–110)
Creatinine: 0.89 mg/dL (ref 0.55–1.30)
Glucose: 85 mg/dL (ref 70–139)
Potassium: 3.8 mmol/L (ref 3.6–5.2)
Protein, total: 7.7 g/dL (ref 6.0–8.4)
Sodium: 139 mmol/L (ref 135–146)
eGFRcr: 82 mL/min/{1.73_m2} (ref >=60)

## 2023-04-20 LAB — TESTOSTERONE: Testosterone, total: 4 ng/dL — ABNORMAL LOW (ref 12–53)

## 2023-04-20 LAB — HEMOGLOBIN A1C: HEMOGLOBIN A1C % (INT/EXT): 5.8 % — ABNORMAL HIGH (ref <5.6)

## 2023-04-20 NOTE — Progress Notes (Signed)
[facility]ious [facility] [place]  [address]  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 She presents today for follow-up of treatment of the Paige [name] lesion after completing about 9 months total of therapy.    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 She originally noticed a violaceous lesion on the dorsum of her right 4th finger around April 2021. An x-ray at the time showed a 3 mm metallic foreign body, which ended up being a very small metal splinter that spontaneously came out with removal of a scab. She underwent wound exploration, debridement, and biopsy, but no additional foreign body was found and pathology reportedly showed chronic inflammation. Initial bacterial, fungal, and AFB cultures were negative. She was treated with intermittent doxycycline with waxing/waning appearance of the lesion. In March 2022, she was seen by Dr. 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 She was seen by Dr. Margy Clarks from Infectious Diseases in May and October 2022, but treatment for the Paige [name] skin infection was deferred given that she was pregnant with twins and then breastfeeding two infants. The skin lesion improved somewhat during pregnancy, but flared again in late 2023, causing her to re-present to Dermatology in October 2023. On  07/10/22, she was seen by Dr. Jake Bathe and started on clarithromycin 500 mg PO BID. She had some improvement on clarithromycin monotherapy with lightening of the lesion. On 07/28/22, I saw her in ID clinic and recommended switching to dual therapy with azithromycin and rifampin given higher chance of cure than with monotherapy. Her baseline labs and EKG were normal. Monitoring labs have been normal.    Interval History  Paige [name] continues to take azithromycin, rifampin, and atorvastatin first thing in the morning on an empty stomach and reports feeling well. She has not had any fatigue, anorexia, abdominal pain, significant nausea, vomiting, fever, chills, vision changes, easy bruising, gum bleeding, or rashes. Over the last 6 weeks, the lesion on her right 4th finger has become lighter in color and less rough textured, although has not resolved completely. She has not had any new skin lesions. Her monitoring labs today were normal, including: normal renal function, normal electrolytes, normal liver function, normal blood counts.      Review of Systems   Pertinent positives and negatives as per HPI, otherwise a 10 point ROS was negative.    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 She works as a Garment/textile technologist at [employer]. No history of  tobacco, alcohol, or substance use.    Family History  Not contributory to this consult.    Allergies  Patient has no known allergies.    Current Medications  Current Outpatient Medications   Medication Sig Dispense Refill    atorvastatin (Lipitor) 40 mg tablet Take 1 tablet (40 mg) by mouth once daily. To lower cholesterol 90 tablet 1    azithromycin  (Zithromax) 500 mg tablet Take 1 tablet (500 mg) by mouth once daily. 30 tablet 2    CALCIUM CARBONATE ORAL Take 1 tablet by mouth in the morning.      cholecalciferol, vitamin D3, 50 mcg (2,000 unit) tablet,chewable Chew 2,000 Units in the morning.      ibuprofen 800 mg tablet Take 1 tablet (800 mg) by mouth if needed in the morning, at noon, and at bedtime for pain score 1-3 (pain). 90 tablet 5    metFORMIN XR (Glucophage-XR) 500 mg 24 hr tablet Take 1 tablet (500 mg) by mouth with evening meal. Do not crush, chew, or split. 180 tablet 3    rifAMPin (Rifadin) 300 mg capsule Take 2 capsules (600 mg) by mouth once daily. 60 capsule 2     No current facility-administered medications for this visit.       Physical Exam:  Vitals: Blood pressure 108/73, pulse 76, temperature 36.1 C (97 F), temperature source Temporal, resp. rate 18, weight 76.2 kg, SpO2 100%, not currently breastfeeding.   Gen: Well-appearing woman, alert and conversant.   HEENT: EOMI, no scleral icterus.  Chest: CTAB.  Cardiac: RRR, nl S1 S2, no murmurs.  Abdomen: Soft, non-tender, non-distended.  Neuro: A&O x 3, normal gait, moving all extremities.  Upper Extremities: R 4th finger with a significantly improved plaque over the dorsal surface of the proximal phalanx and PIP joint. Compared to her last visit, the lesion is lighter in color and no longer rough in texture. There are no subcutaneous nodules or lymphadenopathy.  Skin: No rashes or lesions apart from the right 4th finger lesion.    04/20/23:    03/09/23:            02/27/23:      01/05/23:      11/24/22:      10/20/22:      09/22/22:      08/25/22:      07/28/22:      07/10/22 ([facility]):        Labs  Lab Results   Component Value Date    WBC 7.2 04/20/2023    HGB 14.3 04/20/2023    MCV 86.4 04/20/2023    PLT 214 04/20/2023          Chemistry    Lab Results   Component Value Date/Time    NA 139 04/20/2023 1204    K 3.8 04/20/2023 1204    CL 102 04/20/2023 1204    CO2 27 04/20/2023 1204    BUN 15  04/20/2023 1204    CREATININE 0.89 04/20/2023 1204    Lab Results   Component Value Date/Time    CALCIUM 9.8 04/20/2023 1204    ALKPHOS 86 04/20/2023 1204    AST 26 04/20/2023 1204    ALT 17 04/20/2023 1204    BILITOT 0.5 04/20/2023 1204        Microbiology  Wound:  11/29/20 R 4th finger wound biopsy: Culture grew Mycobacterium [name].      Pathology  11/29/20 R 4th finger punch biopsy:  A. RIGHT DORSAL 4TH FINGER, PUNCH  BS   DENSE, WELL-FORMED DERMAL GRANULOMATOUS DERMATITIS (SEE COMMENT).   COMMENT: The findings are concerning for an infectious etiology. However, no fungal   organisms are identified with repeat PAS stains and repeat gram and AFB stains are negative   for pathogenic bacterial organisms. Tissue culture is recommended. Multiple levels examined.        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 She presents today for follow-up of treatment of the Paige [name] lesion after completing about 9 months total of therapy.    Paige [name] right 4th finger lesion has improved significantly after about 9 months of treatment, with complete resolution of the smaller satellite lesion and resolution of the nodularity/ulceration of the main lesion, as well as marked improvement in the violaceous pigmentation. The hyperpigmentation has been slow to resolve completely. After such a long course of antibiotics with slowed interval improvement over the last couple of months, I suspect that the residual hyperpigmentation may be due to scarring rather than persistent active infection. Today, we had a risk/benefit discussion about stopping antimicrobials and monitoring for recurrence  versus continuing for another couple of months to see if she achieves complete resolution of the hyperpigmentation. Together, we opted to stop treatment and monitor closely. If her right 4th finger lesion recurs (becomes larger, more nodular, more hyperpigmented), we discussed that she will need to see Dr. Karin Lieu from Orthopedic Surgery again for consideration of repeat debridement/biopsy with AFB culture to try to achieve better source control, ensure that we are not missing an alternative pathogen, and for susceptibility testing to guide additional antimicrobials.    Diagnostics:  - CBC/d and CMP were normal today  - Will need repeat lipids checked in about 2 months as her statin dose may need to be changed    Therapeutics:  - Stop azithromycin and rifampin  - Continue taking atorvastatin per PCP    MIPS  Last hemoglobin A1c: 5.5%  - Does patient had a history of hypertension: no  - Does patient have a history of diabetes: no     Follow Up Plan  - In person visit with me on 05/25/23    Paige [name], PaigeD.  Infectious Diseases Attending

## 2023-04-20 NOTE — Addendum Note (Signed)
Addended by: Ria Bush on: 05/10/2023 11:25 PM     Modules accepted: Level of Service

## 2023-05-13 ENCOUNTER — Telehealth
Admit: 2023-05-13 | Discharge: 2023-05-13 | Payer: BLUE CROSS/BLUE SHIELD | Attending: "Endocrinology | Primary: Family Medicine

## 2023-05-13 ENCOUNTER — Ambulatory Visit: Payer: BLUE CROSS/BLUE SHIELD | Attending: Registered" | Primary: Family Medicine

## 2023-05-13 DIAGNOSIS — E669 Obesity, unspecified: Secondary | ICD-10-CM

## 2023-05-13 NOTE — Progress Notes (Signed)
[facility] WEIGHT AND Paige [name]  [address]  [place] [address]  [phone]    Patient ID: Paige [name] is a 42 y.o. female who presents for medical weight loss.    Subjective     HPI including why patient wants to lose weight:  Struggles with weight, has PCOS.  Started on metformin about 12 years ago.  Has been tolerating the medication less.  Med has been less helpful.  Had GDM with last two pregnancies using lifestyle alone.  No macrosomia for children at birth.  Has seen Dr. Tedra [name] for GDM.     Triggers for weight gain: Pregnancy, has been difficult to lose weight between pregnancies    Appetite/hunger over years:  Has had problems with cravings for sugar, appetite is slightly more than normal    Past attempts at weight loss:  Has joined gyms in the past    Prior weight loss meds:  Metformin    Peak weight from the past:  Peak weight 175 lbs after pregnancy    Meal pattern: Eating 3 meals per day, 2-3 snacks per day for hunger; eats evenly over the day    Home vs. Restaurant meals:  Restaurants once every 1-2 weeks    Binging/Purging: None    Food allergies/intolerances:  None    Activity at present including home equipment:  Job is active, chair exercises, no home equipment    Sleep issues:  Sleeping OK, light sleeper    Current monitoring:  No logs, weighing in 2x per week    Past Medical History:   Diagnosis Date    Diabetes mellitus (Multi-HCC) 2017    STD (sexually transmitted disease) 2005    Tinea corporis 2006   PCOS: Irregular menses, high testosterone, cysts on [place], infertility (metformin helped)  No history of MEN/MTC, pancreatitis, gallstones      Current Outpatient Medications:     atorvastatin (Lipitor) 40 mg tablet, Take 1 tablet (40 mg) by mouth once daily. To lower cholesterol, Disp: 90 tablet, Rfl: 1    CALCIUM CARBONATE ORAL, Take 1 tablet by mouth in the morning., Disp: , Rfl:     cholecalciferol, vitamin D3, 50 mcg (2,000 unit) tablet,chewable, Chew 2,000 Units in the  morning., Disp: , Rfl:     ibuprofen 800 mg tablet, Take 1 tablet (800 mg) by mouth if needed in the morning, at noon, and at bedtime for pain score 1-3 (pain)., Disp: 90 tablet, Rfl: 5    metFORMIN XR (Glucophage-XR) 500 mg 24 hr tablet, Take 1 tablet (500 mg) by mouth with evening meal. Do not crush, chew, or split., Disp: 180 tablet, Rfl: 3    multivitamin with minerals tablet, Take 1 tablet by mouth once daily., Disp: , Rfl:     Vitamins: Daily MVI    Family History   Problem Relation Name Age of Onset    Atrial fibrillation Mother Mother 87    Hyperlipidemia Mother Mother     Arthritis Mother Mother     Hyperlipidemia Father Father     Nephrolithiasis Father Father     Vision loss Father Father     No Known Problems Sister      Other (stomach issues) Sister      Hyperlipidemia Brother      Heart disease Neg Hx          No known family congenital heart disease or cardiac history.   Mother is overweight, brother lost weight; ?grandmother with diabetes  No  family history of MEN or MTC    Tobacco:  None    Alcohol: 4-5 drinks per year    Occupation: Zookeeper    ROS:       No palpitations or chest pain  No dyspnea  No constipation or diarrhea  No urinary problems  Menses went from 21-35 days, husband with vasectomy  No muscle weakness       Objective   Wt 75.3 kg   BMI 31.38 kg/m   BP 108/73 in 04/2023      Lab Results   Component Value Date    TSH 1.94 07/28/2022      Lab Results   Component Value Date    HGBA1C 5.8 (H) 04/20/2023      Lab Results   Component Value Date    CHOL 238 (H) 03/09/2023    CHOL 234 (H) 10/20/2022    CHOL 310 (H) 07/28/2022     Lab Results   Component Value Date    HDL 48 03/09/2023     Lab Results   Component Value Date    LDLCALC 161 (H) 03/09/2023     Lab Results   Component Value Date    TRIG 143 03/09/2023     Lab Results   Component Value Date    ALT 17 04/20/2023    AST 26 04/20/2023        FIB-4 Calculation: 1.3 at 04/20/2023 12:04 PM  Calculated from:  SGOT/AST: 26 U/L at 04/20/2023  12:04 PM  SGPT/ALT: 17 U/L at 04/20/2023 12:04 PM  Platelets: 214 K/uL at 04/20/2023 12:04 PM  Age: 44 years       Assessment/Plan     Paige [name] has a diagnosis of polycystic ovarian syndrome 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  She has been able to get pregnant twice and her menses are fairly regular at present even though her weight is higher than optimal.  She had gestational diabetes with the last pregnancy and wants to avoid type 2 diabetes in the future. We reviewed a comprehensive medical weight loss plan as detailed below. Focus will be on optimizing dietary quality and quantity while maximizing physical activity with aerobic and resistance exercises over the week.      1.The initial weight loss goal will be 5-10% of the starting weight or 4-8 kg over the next 6+ months.  2. Consider starting a daily multivitamin and calcium with vitamin D by diet or supplements.    3. Start to record a self monitoring log of food intake and activity for our review and weigh in regularly.  4. Would aim for as much plant based and unprocessed eating in the diet as possible with a focus on both dietary quality and quantity.  High protein meal substitutes such as high protein shakes with fruit or selected low calorie frozen entrees with extra vegetables may be used to help lower caloric intake.    5. An activity regimen will be started with walking, dancing or other aerobic activity fo20-30+ minutes per day, 7 days per week.  Strength training should be added in the future as well on 2-3 non-consecutive days per week.    6. Will submit for authorization of weekly Zepbound or Wegovy to help with weight management.  Side effects were reviewed.  The dose can be increased over months based on efficacy and tolerance.   7. Visits with a registered dietitian will be part of the long term team approach.  She will  also continue to followup with Dr. Tedra [name] at [facility].     I will see her again later in the  year for ongoing weight management care.    This real-time, interactive virtual Telehealth encounter was done by video with the patient's verbal consent. Two patient identifiers were used and confirmed. Physical location of the patient: home Patient resides in: [employer]  Physical location of the provider: office. Other participants/involvement: none  Total minutes spent: 45      Tamicka Shimon D. Henrene Hawking, MD  Division of Endocrinology, Diabetes and Metabolism  [name]  [name]  [email or web address]

## 2023-05-13 NOTE — Progress Notes (Signed)
RXSP MEDICATION ACCESS - PHARMACY BENEFIT      RX PA approved for ZEPBOUND PER [employer]    Plan type: Commercial    PA effective from 05/13/2023 TO 11/11/2023  PA # [id number]    RX can be filled at ANY PHARMACY  Copay information: $25.00   Copay card can be applied: Yes.      approved letter will be scanned into patient's medical record. Once received        Signed  Ariabella Brien, CPhT        RXSP MEDICATION ACCESS PRIOR AUTHORIZATION SUBMISSION - Decision pending    Date of Submission: 05/13/2023 to [facility] via CMM Key:[id number]  Please allow at least 24-72 hours for decision updates.     Medication (name, strength, form): ZEPBOUND 2.5MG   Request Type: New Start          Signed     Rhae Lerner, CPhT  Specialty Pharmacy Technician

## 2023-05-14 MED ORDER — tirzepatide, weight loss, (Zepbound) 2.5 mg/0.5 mL syringe
2.5 | SUBCUTANEOUS | 3 refills | 28.00000 days | Status: DC
Start: 2023-05-14 — End: 2023-06-08

## 2023-05-25 ENCOUNTER — Ambulatory Visit
Admit: 2023-05-25 | Discharge: 2023-05-25 | Payer: BLUE CROSS/BLUE SHIELD | Attending: Pediatrics | Primary: Family Medicine

## 2023-05-25 DIAGNOSIS — A311 Cutaneous mycobacterial infection: Secondary | ICD-10-CM

## 2023-05-25 NOTE — Progress Notes (Signed)
[facility] [place]  [address]  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 She presents today for follow-up of the Paige [name] lesion about 4 weeks after stopping antibiotics.    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 She originally noticed a violaceous lesion on the dorsum of her right 4th finger around April 2021. An x-ray at the time showed a 3 mm metallic foreign body, which ended up being a very small metal splinter that spontaneously came out with removal of a scab. She underwent wound exploration, debridement, and biopsy, but no additional foreign body was found and pathology reportedly showed chronic inflammation. Initial bacterial, fungal, and AFB cultures were negative. She was treated with intermittent doxycycline with waxing/waning appearance of the lesion. In March 2022, she was seen by Dr. 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 She was seen by Dr. Margy Clarks from Infectious Diseases in May and October 2022, but treatment for the Paige [name] skin infection was deferred given that she was pregnant with twins and then breastfeeding two infants. The skin lesion improved somewhat during pregnancy, but flared again in late 2023, causing her to  re-present to Dermatology in October 2023. On 07/10/22, she was seen by Dr. Jake Bathe and started on clarithromycin 500 mg PO BID. She had some improvement on clarithromycin monotherapy with lightening of the lesion. On 07/28/22, I saw her in ID clinic and recommended switching to dual therapy with azithromycin and rifampin given higher chance of cure than with monotherapy. She completed a total of about 9 months of antibiotic therapy, with resolution of the nodularity of her finger lesion and marked improvement in hyperpigmentation. She still has a faint area of hyperpigmentation on her finger, which I suspect may be more due to scarring than active infection. Given this, we made the mutual decision to stop antibiotics around 04/27/23 and monitor closely.    Interval History  Paige [name] has been off of antibiotics for the last ~4 weeks. She continues to feel well, without any systemic signs or symptoms of infection. Since stopping treatment, the hyperpigmentation on her right 4th finger has remained stable. She has not had any new skin lesions nor any recurrence of the nodularity of her finger lesion.    Review of Systems   Pertinent positives and negatives as per HPI, otherwise a 10 point ROS was negative.    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 She works as a Garment/textile technologist at [employer]. No history of tobacco, alcohol, or substance use.    Family History  Not contributory to this consult.    Allergies  Patient has no known allergies.    Current Medications  Current Outpatient Medications   Medication Sig Dispense Refill    atorvastatin (Lipitor) 40 mg tablet Take 1 tablet (40 mg) by  mouth once daily. To lower cholesterol 90 tablet 1    CALCIUM CARBONATE ORAL Take 1 tablet by mouth in the morning.      cholecalciferol, vitamin D3, 50 mcg (2,000 unit) tablet,chewable Chew 2,000 Units in the morning.      ibuprofen 800 mg tablet Take 1 tablet (800 mg) by mouth if needed in the morning, at noon, and at bedtime for pain score 1-3 (pain). 90 tablet 5    metFORMIN XR (Glucophage-XR) 500 mg 24 hr tablet Take 1 tablet (500 mg) by mouth with evening meal. Do not crush, chew, or split. 180 tablet 3    multivitamin with minerals tablet Take 1 tablet by mouth once daily.      tirzepatide, weight loss, (Zepbound) 2.5 mg/0.5 mL syringe Inject 0.5 mL (2.5 mg) under the skin 1 (one) time per week. 2 mL 3     No current facility-administered medications for this visit.       Physical Exam:  Vitals: not currently breastfeeding.   Gen: Well-appearing woman, alert and conversant.   HEENT: EOMI, no scleral icterus.  Chest: CTAB.  Cardiac: RRR, nl S1 S2, no murmurs.  Abdomen: Soft, non-tender, non-distended.  Neuro: A&O x 3, normal gait, moving all extremities.  Upper Extremities: R 4th finger with a significantly improved plaque over the dorsal surface of the proximal phalanx and PIP joint. Compared to her last visit, the lesion is lighter in color and no longer rough in texture. There are no subcutaneous nodules or lymphadenopathy.  Skin: No rashes or lesions apart from the right 4th finger lesion.    05/25/23:            04/20/23:    03/09/23:            02/27/23:      01/05/23:      11/24/22:      10/20/22:      09/22/22:      08/25/22:      07/28/22:      07/10/22 ([facility]):        Labs  Lab Results   Component Value Date    WBC 7.2 04/20/2023    HGB 14.3 04/20/2023    MCV 86.4 04/20/2023    PLT 214 04/20/2023          Chemistry    Lab Results   Component Value Date/Time    NA 139 04/20/2023 1204    K 3.8 04/20/2023 1204    CL 102 04/20/2023 1204    CO2 27 04/20/2023 1204    BUN 15 04/20/2023 1204    CREATININE 0.89  04/20/2023 1204    Lab Results   Component Value Date/Time    CALCIUM 9.8 04/20/2023 1204    ALKPHOS 86 04/20/2023 1204    AST 26 04/20/2023 1204    ALT 17 04/20/2023 1204    BILITOT 0.5 04/20/2023 1204        Microbiology  Wound:  11/29/20 R 4th finger wound biopsy: Culture grew Mycobacterium [name].      Pathology  11/29/20 R 4th  finger punch biopsy:  A. RIGHT DORSAL 4TH FINGER, PUNCH BS   DENSE, WELL-FORMED DERMAL GRANULOMATOUS DERMATITIS (SEE COMMENT).   COMMENT: The findings are concerning for an infectious etiology. However, no fungal   organisms are identified with repeat PAS stains and repeat gram and AFB stains are negative   for pathogenic bacterial organisms. Tissue culture is recommended. Multiple levels examined.        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 She presents today for follow-up of the Paige [name] lesion about 4 weeks after stopping antibiotics.    Paige [name] right 4th finger lesion/hyperpigmentation remains stable about 4 weeks since stopping all antibiotics. Given this, I am hopeful that the residual hyperpigmentation may be due to scarring rather than persistent active infection. We discussed again that Paige [name] needs to keep a close eye on her finger and reach out to my office if there is any concern for recurrent infection (e.g. the hyperpigmented area becomes larger, more nodular, more discolored). If there is concern for true recurrence, we discussed that she will need to see Dr. Karin Lieu from Orthopedic Surgery again for consideration of repeat debridement/biopsy with  AFB culture to try to achieve better source control, ensure that we are not missing an alternative pathogen, and for susceptibility testing to guide additional antimicrobials.    Of note, Paige [name] will need repeat lipids checked after 06/16/23 as her statin dose may need to be changed after stopping rifampin.    MIPS  Last hemoglobin A1c: 5.5%  - Does patient had a history of hypertension: no  - Does patient have a history of diabetes: no     Follow Up Plan  - As needed with Infectious Diseases  - Follow-up with PCP    I spent a total of 30 minutes on this visit including history/exam, review of notes from PCP and Endocrinology, counseling regarding signs/symptoms of recurrent infection, contingency planning if her infection recurs, and documentation.    Tonny Branch. Darlette Dubow, PaigeD.  Infectious Diseases Attending

## 2023-06-08 ENCOUNTER — Telehealth
Admit: 2023-06-08 | Discharge: 2023-06-08 | Payer: BLUE CROSS/BLUE SHIELD | Attending: Registered" | Primary: Family Medicine

## 2023-06-08 DIAGNOSIS — E669 Obesity, unspecified: Secondary | ICD-10-CM

## 2023-06-08 MED ORDER — tirzepatide, weight loss, (Zepbound) 2.5 mg/0.5 mL syringe
2.5 | SUBCUTANEOUS | 3 refills | 28.00000 days | Status: DC
Start: 2023-06-08 — End: 2023-08-17

## 2023-06-08 NOTE — Progress Notes (Signed)
[facility]  [address]  [place] [address]  [phone]         Outpatient Nutrition Follow Up  Name: Paige [name]  MRN: [id number]  DOB: October 05, 1980    Date Seen: 06/08/2023        Paige [name], *     Visit Type: Telemedicine    This real-time, interactive virtual Telehealth encounter was done by video with the patient's verbal consent. Two patient identifiers were used and confirmed. Physical location of the patient: home Patient resides in: [place]  Physical location of the provider: home. Other participants/involvement: none  Time start: 8:30am Time end: 8:54am    Pt Paige [name] is a 42 y.o. female is here today for a nutrition visit for the Medical Weight Loss program.    Past Medical History:  Patient Active Problem List   Diagnosis    Encounter for gynecological examination (general) (routine) without abnormal findings    Eczema    Allergy, unspecified, initial encounter    Hyperlipidemia (CMS-HCC)    Vitamin D deficiency    History of gestational diabetes    Vesicular skin lesions    Post-nasal drip    Obesity    Melanocytic nevi, unspecified    Polycystic ovarian syndrome    Gastroesophageal reflux disease    Diet controlled gestational diabetes mellitus (GDM) in third trimester (HHS-HCC)    Mycobacteria, atypical    Skin lesion of breast    Skin lesion of left leg    Foot pain, bilateral     Past Medical History:   Diagnosis Date    Diabetes mellitus (Multi-HCC) 3017    STD (sexually transmitted disease) 2005    Tinea corporis 2006       No Known Allergies    Pertinent Medications:  Current Outpatient Medications   Medication Instructions    atorvastatin (LIPITOR) 40 mg, oral, Daily, To lower cholesterol    CALCIUM CARBONATE ORAL 1 tablet, oral, Daily    cholecalciferol (vitamin D3) 2,000 Units, oral, Daily RT    ibuprofen 800 mg, oral, 3 times daily PRN    metFORMIN XR (GLUCOPHAGE-XR) 500 mg, oral, Daily with evening meal, Do not crush, chew, or split.     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: Lives with husband and 4 children   Feels safe at home: Yes  Supports: Yes   Barriers to learning: None     Weight History:  Program start wt: 170 lbs :  Highest wt:  lbs   Goal wt:   lbs  Why wt loss now: Wants to feel better and improve health (high cholesterol) for herself and children   Family hx:  Onset of wt change: mid-20s and having first child   Wt loss attempts: Plant based diet, food logging when dx'd with gestational DM during 2nd pregnancy          04/09/2023     1:28 PM 04/13/2023     8:32 AM 04/20/2023    12:15 PM 05/13/2023    10:51 AM 05/25/2023    12:53 PM 05/25/2023    12:54  PM 06/08/2023     8:33 AM   Vitals   Systolic 109  108   112    Diastolic 62  73   77    Pulse 79  76   89    Temp   36.1 C (97 F)   36.3 C (97.3 F)    Resp   18       Height (in) 1.549 m 1.549 m     1.549 m   Weight (lb) 167 165 168 166 166.2  160.5   SpO2 97 %  100 %   99 %    BMI 31.55 kg/m2 31.19 kg/m2 31.76 kg/m2 31.38 kg/m2 31.42 kg/m2  30.34 kg/m2   BSA (m2) 1.81 m2 1.79 m2 1.81 m2 1.8 m2 1.8 m2  1.77 m2   Visit Report Report  Report  Report Report         Diet Recall / Patterns:  Wake time:   B (6:30am): 2 slices 70 kcals Dave[employer] Killer bread + pb + coffee w/ creamer or breakfast shake (oat milk, protein powder, berries, oats, tsp meringue) 1 day/week OR 1 cup high fiber cereal + apple OR [employer] tortilla + tofu + beans + salsa (sometimes avocado & spinach) OR high fiber cereal (5g)   Sn (9:30am): KIND bar or pretzels or sun butter on 2 wasa crackers OR apple + cheese stick   L (~12:30pm): 1/2 reuben sandwich (takeout) or dinner leftovers or hummus + veggies + crackers OR [employer] tortilla + tofu + beans  + salsa (sometimes avocado & spinach)   Sn: candies + cookies OR meat chomp sticks ([place] or chx) OR 0.5-1 protein bar   D (5:30pm): chx + vegetable + sweet potato or meatloaf or takeout 1 day/week (salad w/ grilled chx, fish tacos) OR 2 eggs + WG pancake + 5 grapes   Sn: ice cream on Saturday's with kids or chips or skips   Bed time:  Fluids: coffee w/ creamer, water (80 oz+/day), iced tea/soda if out to eat  ETOH intake: special occasions/socially   Preferences: Likes: Dislikes: milk over cereal   Missed meals: N   Eating out: Rarely / few times per month 1-2 days/week   Eating speed: Medium  Shopping habits: online, Friday-Saturday   Self monitoring: N     GI Issues: N -pt states she had GERD during pregnancies     Physical Activity: Job is very physical but none at this time outside of work. Pt states she walks anywhere from 10,000-20,000 steps while at work 5 days/week. Used to go gym to do water aerobics 1 day/week and would use weight machines 1 day/week, pt no longer has gym membership.     Supplements/Vitamins/Minerals:  breakfast shake (oat milk, protein powder, berries, oats, tsp meringue)   KIND bars   Lara bars   Shaw's protein bar     Visit Notes:  Pt presents for a follow up nutrition visit. Doing well, weight trending down. Saw MD Dr. Henrene Hawking on 8/29, visit went well. Pt taking Zepbound x4 weeks and tolerating well. Says the medication has suppressed her appetite, reduced her portion sizes, and stopped her urge and/or desire to snack on candies or sugar. Says when she eats sugar now she feels unsatisfied and sick. Also says she's worried she's not getting in enough protein, as she feels full sooner at meals. Breakfast is high fiber cereal. Lunch is tofu with beans and salsa on [employer] tortilla. Dinner is eggs with WG pancake  or meat and veggies. Snacks are apples with cheese sticks and protein bars. Pt considering using protein shakes/bars vs skipping meals if desire to eat decreases. Struggling to  incorporate physical activity on the weekends. Although, pt states she's trying to spend more time outdoors with her kids and has started stretching. Pt has a goal to set a reminder to do a workout video on Saturday mornings to increase physical activity and promote further weight loss.     Noted PMHx significant for hyperlipidemia, PCOS, GERD when pregnant, and vitamin D deficiency. Has been taking metformin since PCOS dx in 2012. Says she has been struggling to tolerate metformin, as it makes her feel nauseous. Also says she's looking forward to meeting with MD Dr. Henrene Hawking on 05/13/23, as her current Endocrinologist does not feel comfortable prescribing pt another weight loss medication.     Assessment:    Nutrition needs:  Weight Used for Equation Calculations: 72.8 kg  IBW (kg) (Calculated) : 47.76 kg  [place]if[place]- St. Jeor Equation: 1315     Estimated Protein Needs  Method for Estimating Needs: 0.8 g/kg/day actual BW  Total Protein Estimated Needs : 60 grams/day     Fluids: at least 64 oz./day    Progress Since Last Visit:  Pt continues to focus on panning meals and snacks when at work to reduce snacking on candy.     Nutrition Diagnosis:  Overweight / obesity related to hx of excess calorie intake, behavioral and environmental factors, and/or genetic factors as evidenced by BMI > 30      Interventions:   Motivational interviewing, Nutrition counseling, Self monitoring, Goal setting, General healthful diet    General plan:   MWL: Create balanced high protein, low fat, low sugar meals that meet designated calorie deficit and protein targets. Include 64+ fl oz of calorie-free fluids. Aim to eat every 3-4 hours with 3 planned meals and 1-2 snacks as needed starting within 1-2 hours of waking. Avoid skipping meals whenever possible. Start keeping self monitoring logs and bring to future appointments.  Practice eating mindfully and slowly, paying attention to hunger and satiety cues. Follow up with [facility] and Medical MD  prn.    Recommendations / Goals:  Emphasized the importance of eating every few hours and meeting protein needs to facilitate weight loss. Explained that weight loss medications like Zepbound may result in suppressed appetite and skipping meals. Encouraged pt to pair protein rich foods and/or protein shakes/bars at every meal and snack vs skipping.   Continue to avoid sugar, as pt states it does not satisfy her and makes her feel sick.   Pt has a goal to set a reminder to do a workout video on Saturday mornings to increase physical activity and to incorporate strength training to support LBM building/maintenance.   Explained that weight loss meds like Zepbound may cause constipation. Rec'd pt incorporate a daily fiber supplement + at least 64 oz water to regulate BMs, if she begins to struggling with constipatin.     Handouts: None     Monitoring/Evaluation:     Weigh checks, self-monitoring, structured eating, interdisciplinary visits    Progress: Actively working on nutrition goals and lifestyle change       Visit Information / Follow Up:  6-8 weeks    Aris Lot, RD

## 2023-06-22 ENCOUNTER — Telehealth
Admit: 2023-06-22 | Discharge: 2023-06-22 | Payer: BLUE CROSS/BLUE SHIELD | Attending: Internal Medicine | Primary: Family Medicine

## 2023-06-22 DIAGNOSIS — E282 Polycystic ovarian syndrome: Secondary | ICD-10-CM

## 2023-06-22 NOTE — Progress Notes (Signed)
[facility]  [address]  [place] [address]  Dept Phone: [phone]  Dept Fax: [phone]     Endocrinology Clinic Note    Referring Physician: Frances Furbish, MD      History of present illness:  Paige [name] is a 42 y.o. female  PMH of PCOS,HL here for f/u of GDM    menses regular, not losing any weight, got diarrhea and nausea with regular metformin. Seeing RD. currently on metformin 500 mg XR tolerating this fine A1c is normal at 5.8% in aug 2024    Started zepbound 2.5 mg weekly 5 weeks ago from Dr Toma Copier 8lbs,appetite down,cravings down    LDL very high 174 but now down to 194 ,PCP started atorvastatin 20 mg/d    Prior to this I have seen her in 2019 with history of gestational diabetes.  Was on metformin in the first trimester, then gestational diabetes was diet controlled    42 y/o boy-7lb 1 ounce, was born in [place], no GDM then but was on metformin until 14 weeks and was on metformin even in this pregnancy until 14 weeks and was stopped by Dr Dorna Bloom    42 y/o boy- 6lb 12 ounces-GDM, diet controlled      PCOS Diagnosed 8 years ago based on oligomenorrhea, no acne/hirsuitism-clomiphene/IUI second preg first pregnancy she lost the bay at 9 weeks,this preg-no clomiphene but was on metformin. 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,married      Assessment/Plan:  Paige [name] is a 42 y.o. female G4P2 PMH of PCOS,HL here for f/u of GDM    H/o Gestational diabetes,prediabetes : last a1c ok 5.5->5.8%, 0.2 0.5 mg weekly.  She will finish her metformin 500 mg XR in 1 month    PCO S: On slow release metformin 500 mg XR daily and has started Zepbound 2.5 mg weekly from Dr. Henrene Hawking.  She was asked to not refill any more metformin and I agree.   Menses are regular, not planning another pregnancy.  She is interested in medical weight loss will [facility].  We will redo her testosterone  level and A1c in 4 months    LDL up again, breast feeding, strong family history of hypercholesterolemia. LDL very 160 but now down from 194 ,PCPincreased  atorvastatin 40 mg/d, she is now off of rifampin and ID has her do LDL soon    She prefers video visits for now,follow-up in 4 months with labs    Thank you for involving in this patient's care. Please do not hesitate to contact with questions or concerns         Rosina Lowenstein, MD         Pertinent lab review:    Lab Results   Component Value Date    HGBA1C 5.8 (H) 04/20/2023      Lab Results   Component Value Date    GLUCOSE 85 04/20/2023    CALCIUM 9.8 04/20/2023    NA 139 04/20/2023    K 3.8 04/20/2023    CO2 27 04/20/2023    CL 102 04/20/2023    BUN 15 04/20/2023    CREATININE 0.89 04/20/2023      Lab Results   Component Value Date    LDLCALC 161 (H) 03/09/2023      Lab Results   Component Value Date    HGBA1C 5.8 (H) 04/20/2023  HGBA1C 5.5 07/28/2022     Lab Results   Component Value Date    GLUF 82 03/06/2021    LDLCALC 161 (H) 03/09/2023    CREATININE 0.89 04/20/2023        Lab Results   Component Value Date    ALT 17 04/20/2023    AST 26 04/20/2023    ALKPHOS 86 04/20/2023    BILITOT 0.5 04/20/2023      Lab Results   Component Value Date    TESTOSTERONE 4 (L) 04/20/2023               Medical History:    Past Medical History:   Diagnosis Date    Diabetes mellitus (Multi-HCC) 3017    STD (sexually transmitted disease) 2005    Tinea corporis 2006         Surgical History:    Past Surgical History:   Procedure Laterality Date    D&C FIRST TRIMESTER / TX INCOMPLETE / MISSED / SEPTIC / INDUCED ABORTION N/A 2013    arcuate uterus          Family History:    @BIMFAMHISTORY @       Social History:    No valid surgical or medical questions entered.        Active medical problems:    Patient Active Problem List   Diagnosis    Encounter for gynecological examination (general) (routine) without abnormal findings    Eczema    Allergy, unspecified, initial encounter     Hyperlipidemia (CMS-HCC)    Vitamin D deficiency    History of gestational diabetes    Vesicular skin lesions    Post-nasal drip    Obesity    Melanocytic nevi, unspecified    Polycystic ovarian syndrome    Gastroesophageal reflux disease    Diet controlled gestational diabetes mellitus (GDM) in third trimester (HHS-HCC)    Mycobacteria, atypical    Skin lesion of breast    Skin lesion of left leg    Foot pain, bilateral         Current medications:      Current Outpatient Medications:     atorvastatin (Lipitor) 40 mg tablet, Take 1 tablet (40 mg) by mouth once daily. To lower cholesterol, Disp: 90 tablet, Rfl: 1    CALCIUM CARBONATE ORAL, Take 1 tablet by mouth in the morning., Disp: , Rfl:     cholecalciferol, vitamin D3, 50 mcg (2,000 unit) tablet,chewable, Chew 2,000 Units in the morning., Disp: , Rfl:     ibuprofen 800 mg tablet, Take 1 tablet (800 mg) by mouth if needed in the morning, at noon, and at bedtime for pain score 1-3 (pain)., Disp: 90 tablet, Rfl: 5    metFORMIN XR (Glucophage-XR) 500 mg 24 hr tablet, Take 1 tablet (500 mg) by mouth with evening meal. Do not crush, chew, or split., Disp: 180 tablet, Rfl: 3    multivitamin with minerals tablet, Take 1 tablet by mouth once daily., Disp: , Rfl:     tirzepatide, weight loss, (Zepbound) 2.5 mg/0.5 mL syringe, Inject 0.5 mL (2.5 mg) under the skin 1 (one) time per week., Disp: 2 mL, Rfl: 3       Allergies:    No Known Allergies         ROS:    No palpitations, no tremor, no heat intolerance. No cold intolerance.  No diarrhea or constipation. No change in hair or skin. No dysphagia, no odynophagia, no solid food or pill dysphagia.  No diplopia/blurry vision.No fevers/chills.No change in voice.No anterior neck pressure. No increase in neck size.No wheeze or DOE.No rash.No edema. No cough.No anxiety/Depression.Regular menses Rest of ROS negative or non-contributory.       Physical Exam:    There were no vitals taken for this visit.   [id number]:[id number] alert no  acute distress  HEENT: head atraumatic, normocephalic, no redness in eyes no discharge from nose  SKIN: facial skin does not show any cyanosis  RESP: speaking in full sentences, no increased respiratory effort seen  MSK: normal ROM in fingers  Neuro: facial nerves intact  Pysch: appearance, behavior and attitude-pleasant, cooperative and groomed        This real-time, interactive virtual Telehealth encounter was done by video  with the patient's verbal consent. Two patient identifiers were used and confirmed. Physical location of the patient: home  Patient resides in: [place]  Physical location of the provider: office  Other participants/involvement: none        This note has been created with voice recognition software.  While this note has been edited for accuracy, this software periodically misinterprets speech resulting in errors that might not have been caught in editing.  In the event an unusual error is found in this record, please notify us at  [phone] to resolve the error.

## 2023-07-09 ENCOUNTER — Encounter: Payer: BLUE CROSS/BLUE SHIELD | Attending: Obstetrics & Gynecology | Primary: Family Medicine

## 2023-07-14 ENCOUNTER — Ambulatory Visit
Admit: 2023-07-14 | Discharge: 2023-07-14 | Payer: BLUE CROSS/BLUE SHIELD | Attending: Obstetrics & Gynecology | Primary: Family Medicine

## 2023-07-14 DIAGNOSIS — Z01419 Encounter for gynecological examination (general) (routine) without abnormal findings: Secondary | ICD-10-CM

## 2023-07-14 NOTE — Progress Notes (Signed)
[facility] [place]  [address] [place] [address]  [phone]    Patient ID: Paige [name] is a 42 y.o. female who presents for annual.  Every 26- 31 duration 2 days    chaperone offered and declined  Primary care doctor: [facility]  Neg for Tobacco, Drugs, vape and occ ETOH, does SBE     LMP 07/08/2023  Current medication:zebount 2 months  and metformin and tovestatin  and multivitamin with vitamin D 1000IU daily and calcium and vitamin D 1000IU daily   Allergy n  Medical history - hyperlipidemia and gestational diabetes  Surgical history - septorhinoplasty 2021  Family history  - hyperlipidemia  Work     Colonoscopy    recommend her to call/check with pcp's office re: this appt after [phone]  Mammogram 10/06/2023  Last pap   07/02/22 wnl  Last HPV -07/02/22 neg  Any history of abnormal pap:    Marital status: MW G4P3 nsvd x 3(one set of twin - girl-last pregnancy)  Lives:   Sexually active      Y     Birth control - vasectomy  HPV vaccine:  no  Emergency contact    myriad faminly history screening done on 07/14/2023  plan: she does not meet current guidelines for this test. She declined this test today.     HPI        Review of Systems   Constitutional: Negative.    HENT: Negative.     Eyes: Negative.    Respiratory: Negative.     Cardiovascular: Negative.    Gastrointestinal: Negative.    Endocrine: Negative.    Genitourinary: Negative.    Musculoskeletal: Negative.    Skin: Negative.    Allergic/Immunologic: Negative.    Neurological: Negative.         Objective   Visit Vitals  Ht 1.549 m   Wt 71.2 kg   LMP 07/08/2023   BMI 29.66 kg/m   OB Status Having periods   BSA 1.75 m           04/13/2023     8:32 AM 04/20/2023    12:15 PM 05/13/2023    10:51 AM 05/25/2023    12:53 PM 05/25/2023    12:54 PM 06/08/2023     8:33 AM 07/14/2023    12:00 PM   Vitals   Systolic  108   112     Diastolic  73   77     Pulse  76   89     Temp  36.1 C (97 F)   36.3 C (97.3  F)     Resp  18        Height (in) 1.549 m     1.549 m 1.549 m   Weight (lb) 165 168 166 166.2  160.5 157   SpO2  100 %   99 %     BMI 31.19 kg/m2 31.76 kg/m2 31.38 kg/m2 31.42 kg/m2  30.34 kg/m2 29.66 kg/m2   BSA (m2) 1.79 m2 1.81 m2 1.8 m2 1.8 m2  1.77 m2 1.75 m2   Visit Report  Report  Report Report  Report       Physical Exam  Nursing note reviewed.   Constitutional:       Appearance: Normal appearance.   HENT:      Head: Normocephalic and atraumatic.      Right Ear: External ear normal.      Left Ear:  External ear normal.      Nose: Nose normal.   Cardiovascular:      Rate and Rhythm: Normal rate and regular rhythm.      Heart sounds: Normal heart sounds.   Pulmonary:      Effort: Pulmonary effort is normal.      Breath sounds: Normal breath sounds.   Chest:   Breasts:     Right: Normal.      Left: Normal.   Abdominal:      General: Bowel sounds are normal.      Palpations: Abdomen is soft.   Genitourinary:     General: Normal vulva.      Vagina: Normal.      Uterus: Absent.       Adnexa: Right adnexa normal and left adnexa normal.      Rectum: External hemorrhoid present.      Comments: Cervix absent s/p surgery  Musculoskeletal:      Cervical back: Normal range of motion and neck supple.   Skin:     General: Skin is warm and dry.   Psychiatric:         Mood and Affect: Mood normal.         Assessment/Plan   Visit for gynecologic examination  -     Pap Smear and Aptima HPV w/ Rflx HPV Genotypes 16/18, 45 on High Risk Positive    The patient was counseled regarding regular monthly self-examinations of the breasts, regular sustained exercise for @ least 30 minutes 3-4 times per week, routine screening interval for mammogram, prevention of osteoporosis, importance of regular PAP smears, regular use of lap and shoulder seat belts.    Advice her to make an annual gynecological examination in one year before leaving office today    Patient was advised to check with her insurance company to sure that all tests  ordered today are covered by her insurance company before heading for the tests.

## 2023-07-17 NOTE — Progress Notes (Signed)
Orders place she is due for physical

## 2023-07-20 LAB — PAP SMEAR AND APTIMA HPV W RFLX TO HPV GENOTYPES 16/18, 45 ON HIGH RISK POS: HPV Aptima: NEGATIVE

## 2023-08-17 MED ORDER — tirzepatide, weight loss, (Zepbound) 5 mg/0.5 mL syringe
5 | SUBCUTANEOUS | 3 refills | 28.00000 days | Status: AC
Start: 2023-08-17 — End: ?

## 2023-08-28 LAB — BASIC METABOLIC PANEL
Anion Gap: 12 mmol/L (ref 10.0–18.0)
BUN/Creat Ratio: 16 (ref 9–23)
BUN: 13 mg/dL (ref 6–24)
Calcium: 9.6 mg/dL (ref 8.7–10.2)
Carbon Dioxide: 22 mmol/L (ref 20–29)
Chloride: 105 mmol/L (ref 96–106)
Creat: 0.8 mg/dL (ref 0.57–1.00)
Glucose: 91 mg/dL (ref 70–99)
Potassium: 4.1 mmol/L (ref 3.5–5.2)
Sodium: 139 mmol/L (ref 134–144)
eGFR: 93 mL/min/{1.73_m2} (ref >59)

## 2023-08-28 LAB — LIPID PANEL
Cholesterol: 152 mg/dL (ref 100–199)
HDL Cholesterol: 42 mg/dL (ref >39)
LDLc Calc (NIH): 96 mg/dL (ref 0–99)
Non-HDL Chol: 110 mg/dL (ref 0–129)
Triglycerides: 70 mg/dL (ref 0–149)
VLDLc Calc: 14 mg/dL (ref 5–40)

## 2023-08-28 LAB — HEMOGLOBIN A1C: HgbA1C: 5.6 % (ref 4.8–5.6)

## 2023-09-11 NOTE — Telephone Encounter (Signed)
Refill atorvastatin

## 2023-09-28 ENCOUNTER — Ambulatory Visit
Admit: 2023-09-28 | Discharge: 2023-09-28 | Payer: BLUE CROSS/BLUE SHIELD | Attending: Internal Medicine | Primary: Family Medicine

## 2023-09-28 DIAGNOSIS — E282 Polycystic ovarian syndrome: Secondary | ICD-10-CM

## 2023-09-28 NOTE — Progress Notes (Signed)
[facility]  [address]  [place] [address]  Dept Phone: [phone]  Dept Fax: [phone]     Endocrinology Clinic Note    Referring Physician: Frances Furbish, MD      History of present illness:  Paige [name] is a 43 y.o. female  PMH of PCOS,HL here for f/u of GDM    menses regular  Started zepbound 5 mg weekly 5 weeks ago from Dr Toma Copier 20lbs,appetite down,cravings down    LDL very high 174 but now down to 194 ,PCP started atorvastatin 20 mg/d    Prior to this I have seen her in 2019 with history of gestational diabetes.  Was on metformin in the first trimester, then gestational diabetes was diet controlled    43 y/o boy-7lb 1 ounce, was born in [place], no GDM then but was on metformin until 14 weeks and was on metformin even in this pregnancy until 14 weeks and was stopped by Dr Dorna Bloom    43 y/o boy- 6lb 12 ounces-GDM, diet controlled      PCOS Diagnosed 8 years ago based on oligomenorrhea, no acne/hirsuitism-clomiphene/IUI second preg first pregnancy she lost the bay at 9 weeks,this preg-no clomiphene but was on metformin. 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,married      Assessment/Plan:  Paige [name] is a 43 y.o. female G4P2 PMH of PCOS,HL here for f/u of GDM    H/o Gestational diabetes,prediabetes : last ok a1c 5.6% ,on GLp-1    PCO S: off metformin as has started Zepbound 5 mg weekly from Dr. Henrene Hawking.    Menses are regular, not planning another pregnancy.   Testosterone is low    LDL  strong family history of hypercholesterolemia. LDL 96 but now down from 194 ,PCP increased  atorvastatin 40 mg/d    She prefers video visits for now,follow-up in 6 months with labs unless she will have f/u+labs w/PCP/Dr Seigel    Thank you for involving in this patient's care. Please do not hesitate to contact with questions or concerns         Rosina Lowenstein, MD         Pertinent lab review:    Lab Results    Component Value Date    HGBA1C 5.6 08/27/2023      Lab Results   Component Value Date    GLUCOSE 91 08/27/2023    CALCIUM 9.6 08/27/2023    NA 139 08/27/2023    K 4.1 08/27/2023    CO2 22 08/27/2023    CL 105 08/27/2023    BUN 13 08/27/2023    CREATININE 0.80 08/27/2023      Lab Results   Component Value Date    LDLCALC 96 08/27/2023      Lab Results   Component Value Date    HGBA1C 5.6 08/27/2023    HGBA1C 5.8 (H) 04/20/2023    HGBA1C 5.5 07/28/2022     Lab Results   Component Value Date    GLUF 82 03/06/2021    LDLCALC 96 08/27/2023    CREATININE 0.80 08/27/2023        Lab Results   Component Value Date    ALT 17 04/20/2023    AST 26 04/20/2023    ALKPHOS 86 04/20/2023    BILITOT 0.5 04/20/2023      Lab Results   Component Value Date    TESTOSTERONE 4 (L) 04/20/2023  Medical History:    Past Medical History:   Diagnosis Date    Diabetes mellitus (Multi-HCC) 3017    STD (sexually transmitted disease) 2005    Tinea corporis 2006         Surgical History:    Past Surgical History:   Procedure Laterality Date    D&C FIRST TRIMESTER / TX INCOMPLETE / MISSED / SEPTIC / INDUCED ABORTION N/A 2013    arcuate uterus          Family History:    @BIMFAMHISTORY @       Social History:    No valid surgical or medical questions entered.        Active medical problems:    Patient Active Problem List   Diagnosis    Encounter for gynecological examination (general) (routine) without abnormal findings    Eczema    Allergy, unspecified, initial encounter    Hyperlipidemia    Vitamin D deficiency    History of gestational diabetes    Vesicular skin lesions    Post-nasal drip    Obesity    Melanocytic nevi, unspecified    Polycystic ovarian syndrome    Gastroesophageal reflux disease    Diet controlled gestational diabetes mellitus (GDM) in third trimester (HHS-HCC)    Mycobacteria, atypical    Skin lesion of breast    Skin lesion of left leg    Foot pain, bilateral    Prediabetes         Current medications:      Current  Outpatient Medications:     atorvastatin (Lipitor) 40 mg tablet, TAKE 1 TABLET (40 MG) BY MOUTH ONCE DAILY. TO LOWER CHOLESTEROL, Disp: 90 tablet, Rfl: 1    CALCIUM CARBONATE ORAL, Take 1 tablet by mouth in the morning., Disp: , Rfl:     cholecalciferol, vitamin D3, 50 mcg (2,000 unit) tablet,chewable, Chew 2,000 Units in the morning., Disp: , Rfl:     ibuprofen 800 mg tablet, Take 1 tablet (800 mg) by mouth if needed in the morning, at noon, and at bedtime for pain score 1-3 (pain)., Disp: 90 tablet, Rfl: 5    multivitamin with minerals tablet, Take 1 tablet by mouth once daily., Disp: , Rfl:     tirzepatide, weight loss, (Zepbound) 5 mg/0.5 mL syringe, Inject 0.5 mL (5 mg) under the skin 1 (one) time per week., Disp: 2 mL, Rfl: 3       Allergies:    No Known Allergies         ROS:    No palpitations, no tremor, no heat intolerance. No cold intolerance.  No diarrhea or constipation. No change in hair or skin. No dysphagia, no odynophagia, no solid food or pill dysphagia. No diplopia/blurry vision.No fevers/chills.No change in voice.No anterior neck pressure. No increase in neck size.No wheeze or DOE.No rash.No edema. No cough.No anxiety/Depression.Regular menses Rest of ROS negative or non-contributory.       Physical Exam:    There were no vitals taken for this visit.   ZOX:[id number] alert no acute distress  HEENT: head atraumatic, normocephalic, no redness in eyes no discharge from nose  SKIN: facial skin does not show any cyanosis  RESP: speaking in full sentences, no increased respiratory effort seen  MSK: normal ROM in fingers  Neuro: facial nerves intact  Pysch: appearance, behavior and attitude-pleasant, cooperative and groomed        This real-time, interactive virtual Telehealth encounter was done by video  with the patient's verbal consent. Two  patient identifiers were used and confirmed. Physical location of the patient: home  Patient resides in: [place]  Physical location of the provider: office  Other  participants/involvement: none        This note has been created with voice recognition software.  While this note has been edited for accuracy, this software periodically misinterprets speech resulting in errors that might not have been caught in editing.  In the event an unusual error is found in this record, please notify us at  [phone] to resolve the error.

## 2023-10-06 ENCOUNTER — Inpatient Hospital Stay: Admit: 2023-10-06 | Payer: BLUE CROSS/BLUE SHIELD | Primary: Family Medicine

## 2023-10-06 DIAGNOSIS — Z01419 Encounter for gynecological examination (general) (routine) without abnormal findings: Secondary | ICD-10-CM

## 2023-10-06 DIAGNOSIS — Z1231 Encounter for screening mammogram for malignant neoplasm of breast: Secondary | ICD-10-CM

## 2023-11-03 ENCOUNTER — Ambulatory Visit
Admit: 2023-11-03 | Discharge: 2023-11-03 | Payer: BLUE CROSS/BLUE SHIELD | Attending: Adult Health | Primary: Family Medicine

## 2023-11-03 DIAGNOSIS — O24410 Gestational diabetes mellitus in pregnancy, diet controlled: Secondary | ICD-10-CM

## 2023-11-03 NOTE — Assessment & Plan Note (Signed)
No active issues, tolerating zepbound

## 2023-11-03 NOTE — Assessment & Plan Note (Signed)
No active symptoms, is having mild R knee pain ? From feet vs ligament, try quad strengthening exercises and fu if no improvment

## 2023-11-03 NOTE — Assessment & Plan Note (Signed)
Last A1C 5.6 in 12/24 will repeat in 6 months to follow, continue diet

## 2023-11-03 NOTE — Patient Instructions (Addendum)
Vitamin B complex daily in addition to what your taking to see if helps fatigue    Fu 6 months labs a1c and tsh     Colonoscopy [phone] book after age 43     Goal 150 min  weekly exercise and strength training

## 2023-11-03 NOTE — Assessment & Plan Note (Signed)
CONTINUE TO MONITOR FINGER FOR ANY CHANGES  OR SIGNS OF INFECTION

## 2023-11-03 NOTE — Assessment & Plan Note (Signed)
Well woman, continue increase exercise goal 150 min weekly, Vitamin D3 2000 units daily and add B complex vitamin, labs up to date and reveiwed, return in 6 months, reviewed SBE, colonoscopy [phone] age 43

## 2023-11-03 NOTE — Assessment & Plan Note (Signed)
LDL 96, will continue atorvastatin 40 mg daily repeat lipids in 1 year,

## 2023-11-03 NOTE — Progress Notes (Signed)
[facility]  [address]  [address]  [place] [address]  Dept: [phone]  Dept Fax: [phone]     Patient ID: Paige [name] is a 43 y.o. female who presents for Annual Exam.    Subjective   43 yo woman with hx of PCOS,  saw Dr Antony Blackbird recently, hx of gestational DM on Metformin, obesity, hyperlipidemia hx of atypical infection of finger, here for physical    Off Rifampin since July 2024     GYN: Dr Dorna Bloom, irregular menses , feeling tired taking Vitamin D unsure of the dosage  Dentist every 3 months   Eyes every 2 years   Immunizations: UTD  Colonoscopy age 46   Mammogram UTD 1 2-25    Last LDL 96 in Dec, 2024, 40mg  atorvastatin  Seeing Dr. Sharen Hint for weight managment, on Zepbound  has lost 20 lbs, mild nausea otherwise tolerating well, no constipation   Overall feeling well does have minor knee pain when sits criss/cross on R knee inner side       Review of Systems   Constitutional: Negative.    HENT: Negative.     Eyes: Negative.    Respiratory: Negative.     Cardiovascular: Negative.    Gastrointestinal: Negative.    Genitourinary: Negative.    Musculoskeletal: Negative.  Negative for falls.         R knee mild pain    Skin: Negative.          Finger with scarring from infection however no signs of infection    Neurological: Negative.    Endo/Heme/Allergies: Negative.    Psychiatric/Behavioral: Negative.         Patient Active Problem List   Diagnosis    Encounter for gynecological examination (general) (routine) without abnormal findings    Eczema    Allergy, unspecified, initial encounter    Hyperlipidemia    Vitamin D deficiency    History of gestational diabetes    Vesicular skin lesions    Post-nasal drip    Obesity    Melanocytic nevi, unspecified    Polycystic ovarian syndrome    Gastroesophageal reflux disease    Diet controlled gestational diabetes mellitus (GDM) in third trimester (HHS-HCC)    Mycobacteria, atypical    Skin lesion of breast     Skin lesion of left leg    Foot pain, bilateral    Prediabetes     Current Outpatient Medications   Medication Instructions    atorvastatin (LIPITOR) 40 mg, oral, Daily, To lower cholesterol    CALCIUM CARBONATE ORAL 1 tablet, oral, Daily    cholecalciferol (vitamin D3) 2,000 Units, oral, Daily RT    ibuprofen 800 mg, oral, 3 times daily PRN    multivitamin with minerals tablet 1 tablet, oral, Daily    Zepbound 5 mg, subcutaneous, Weekly     No Known Allergies  Past Medical History:   Diagnosis Date    Allergic 2006    Diabetes mellitus (Multi-HCC) 3017    Eczema 2007    GERD (gastroesophageal reflux disease)     Obesity 2014    STD (sexually transmitted disease) 2005    Tinea corporis 2006     Past Surgical History:   Procedure Laterality Date    D&C FIRST TRIMESTER / TX INCOMPLETE / MISSED / SEPTIC / INDUCED ABORTION N/A 2013    arcuate uterus     Family History   Problem Relation Name Age  of Onset    Atrial fibrillation Mother Mother 43    Hyperlipidemia Mother Mother     Arthritis Mother Mother     Hyperlipidemia Father Father     Nephrolithiasis Father Father     Vision loss Father Father     No Known Problems Sister      Other (stomach issues) Sister      Hyperlipidemia Brother Mellody Dance     Heart disease Neg Hx          No known family congenital heart disease or cardiac history.     Social History     Socioeconomic History    Marital status: Married     Spouse name: married, two sons [name] and [name] born 2014/2017-    Number of children: 4    Years of education: None    Highest education level: None   Occupational History    Occupation: zookeeper   Tobacco Use    Smoking status: Never    Smokeless tobacco: Never   Substance and Sexual Activity    Alcohol use: Not Currently     Comment: very rare    Drug use: Not Currently    Sexual activity: Yes     Partners: Male     Birth control/protection: Female Sterilization   Other Topics Concern    Alcohol/drug concerns No   Social History Narrative    Zookeeper at  [employer]    Hx of M Marium infection in finger    Married      Social Determinants of Health     Financial Resource Strain: Low Risk  (11/03/2023)    Overall Financial Resource Strain (CARDIA)     Difficulty of Paying Living Expenses: Not hard at all   Food Insecurity: Unknown (11/03/2023)    Hunger Vital Sign     Worried About Running Out of Food in the Last Year: Never true     Ran Out of Food in the Last Year: Not on file   Transportation Needs: No Transportation Needs (11/03/2023)    PRAPARE - Therapist, art (Medical): No     Lack of Transportation (Non-Medical): No   Physical Activity: Not on File (03/28/2019)    Received from [facility], [place]    Physical Activity     Physical Activity: 0   Stress: Not on File (03/28/2019)    Received from [facility], [place]    Stress     Stress: 0   Social Connections: Not on File (03/28/2019)    Received from [facility], [place]    Social Connections     Social Connections and Isolation: 0   Intimate Partner Violence: Not on file   Housing Stability: Unknown (11/03/2023)    Housing Stability Vital Sign     Unable to Pay for Housing in the Last Year: Not on file     Number of Times Moved in the Last Year: Not on file     Homeless in the Last Year: No     There are no discontinued medications.  Outpatient Medications Prior to Visit   Medication Sig Dispense Refill    atorvastatin (Lipitor) 40 mg tablet TAKE 1 TABLET (40 MG) BY MOUTH ONCE DAILY. TO LOWER CHOLESTEROL 90 tablet 1    CALCIUM CARBONATE ORAL Take 1 tablet by mouth in the morning.      cholecalciferol, vitamin D3, 50 mcg (2,000 unit) tablet,chewable Chew 2,000 Units in the morning.      ibuprofen  800 mg tablet Take 1 tablet (800 mg) by mouth if needed in the morning, at noon, and at bedtime for pain score 1-3 (pain). 90 tablet 5    multivitamin with minerals tablet Take 1 tablet by mouth once daily.      tirzepatide, weight loss, (Zepbound) 5 mg/0.5 mL syringe Inject 0.5 mL (5 mg) under the skin 1 (one) time per  week. 2 mL 3       Objective   Visit Vitals  BP 106/71 (BP Location: Right arm, Patient Position: Sitting, BP Cuff Size: Adult)   Pulse 71   Ht 1.549 m   Wt 65.2 kg   LMP 09/27/2023   SpO2 (!) 90%   BMI 27.17 kg/m   OB Status Having periods   BSA 1.67 m     Physical Exam  Vitals and nursing note reviewed.   Constitutional:       General: She is not in acute distress.     Appearance: Normal appearance.   HENT:      Head: Normocephalic.      Right Ear: Tympanic membrane, ear canal and external ear normal. There is no impacted cerumen.      Left Ear: Tympanic membrane, ear canal and external ear normal. There is no impacted cerumen.      Nose: Nose normal. No congestion.      Mouth/Throat:      Mouth: Mucous membranes are moist.      Pharynx: Oropharynx is clear. No oropharyngeal exudate.   Eyes:      Extraocular Movements: Extraocular movements intact.      Conjunctiva/sclera: Conjunctivae normal.      Pupils: Pupils are equal, round, and reactive to light.   Neck:      Thyroid: No thyromegaly or thyroid tenderness.      Vascular: No carotid bruit.   Cardiovascular:      Rate and Rhythm: Normal rate and regular rhythm.      Pulses: Normal pulses.      Heart sounds: Normal heart sounds.   Pulmonary:      Effort: Pulmonary effort is normal.      Breath sounds: Normal breath sounds.   Abdominal:      General: Bowel sounds are normal.      Palpations: Abdomen is soft. There is no mass.      Tenderness: There is no abdominal tenderness. There is no right CVA tenderness or left CVA tenderness.   Genitourinary:     Comments:  Sees Dr. Dorna Bloom   Musculoskeletal:         General: Normal range of motion.      Cervical back: Normal range of motion.      Right lower leg: No edema.      Left lower leg: No edema.   Lymphadenopathy:      Cervical: No cervical adenopathy.   Skin:     General: Skin is warm and dry.   Neurological:      General: No focal deficit present.      Mental Status: She is alert and oriented to person, place, and  time.   Psychiatric:         Mood and Affect: Mood normal.         Behavior: Behavior normal.         Thought Content: Thought content normal.         Judgment: Judgment normal.         Assessment/Plan   Stellarose was  seen today for annual exam.  Diet controlled gestational diabetes mellitus (GDM) in third trimester (HHS-HCC)  Assessment & Plan:  Last A1C 5.6 in 12/24 will repeat in 6 months to follow, continue diet  Foot pain, bilateral  Assessment & Plan:  No active symptoms, is having mild R knee pain ? From feet vs ligament, try quad strengthening exercises and fu if no improvment  Gastroesophageal reflux disease, unspecified whether esophagitis present  Assessment & Plan:  No active issues, tolerating zepbound  Hyperlipidemia, unspecified hyperlipidemia type  Assessment & Plan:  LDL 96, will continue atorvastatin 40 mg daily repeat lipids in 1 year,   Mycobacteria, atypical  Assessment & Plan:  CONTINUE TO MONITOR FINGER FOR ANY CHANGES  OR SIGNS OF INFECTION   Class 1 obesity with body mass index (BMI) of 30.0 to 30.9 in adult, unspecified obesity type, unspecified whether serious comorbidity present  Polycystic ovarian syndrome  Assessment & Plan:   Released from endocrinology care, check A1C q 6 months  Vitamin D deficiency  Assessment & Plan:   Check dosage of Vitamin D3, should take higher dosage in winter months      Patient Health Questionnaire-9 Score: 1   Interpretation: Negative screening.      Follow-up & Interventions: Maintain annual screening - No additional Follow-up required  Generalized Anxiety Disorder Screening - GAD-7 Score: 0  Interpretation: Negative screening.  Follow up & Intervention: Maintain annual screening - No additional Follow-up required

## 2023-11-03 NOTE — Assessment & Plan Note (Signed)
Check dosage of Vitamin D3, should take higher dosage in winter months

## 2023-11-03 NOTE — Assessment & Plan Note (Signed)
Released from endocrinology care, check A1C q 6 months

## 2023-11-20 NOTE — Telephone Encounter (Addendum)
 Department Name: Weight Wellness    [place]    Patient: Paige [name]  MRN: [id number]  Agent: Cheryll Dessert    [facility] Center Scheduling Message    Patient requesting call back from: admin    In regards to: patient would like visit on 03/12 to be a tele visit  Not in person with Dr Erik Obey call back number: [phone]

## 2023-11-23 NOTE — Telephone Encounter (Signed)
 Patient calling requesting her appointment fro tomorrow to be  a  video visit , pt # [phone], thank you.

## 2023-11-25 ENCOUNTER — Ambulatory Visit
Admit: 2023-11-25 | Discharge: 2023-11-25 | Payer: BLUE CROSS/BLUE SHIELD | Attending: "Endocrinology | Primary: Family Medicine

## 2023-11-25 DIAGNOSIS — E663 Overweight: Secondary | ICD-10-CM

## 2023-11-25 MED ORDER — tirzepatide, weight loss, (Zepbound) 5 mg/0.5 mL syringe
5 | SUBCUTANEOUS | 11 refills | 28.00000 days | Status: AC
Start: 2023-11-25 — End: ?

## 2023-11-25 NOTE — Patient Instructions (Addendum)
 Behavioral/Monitoring:  Continue with 1-2x per week weight checks in the morning.  Consider intermittent tracking with your app for accountability.      Nutrition: Continue to focus meals around high fiber foods along with protein of any sort.  Try to get any type of fruits or vegetables at most or all meals or snacks.  Limit processed meats    Activity: Build up daily aerobic activity with walking, dancing for every day that you can.  Keep up resistance twice per week    Medication:  Continue with Zepbound 5 mg weekly.

## 2023-11-25 NOTE — Progress Notes (Signed)
 [facility]  [address]  [place] [address]  [phone]    Patient ID: Paige [name] is a 43 y.o. female who presents for medical weight loss.    Subjective     Interval history: No new medical issues over last 6 months.  Feeling well.     Dietary monitoring and recall:  Weighing in 1-2 times per week, downloaded app but not using at present; recall - ww e muffin with peanut butter, banana, sausage and egg on toast, peas, cauliflower with chicken, vegetables taco, apple;  has cut back on sweets, more chicken, less red meat, may have apple and cheese as snack, drinking water, coffee, no protein shakes    Activity: Has hand weights, lunges, starting to walk at lunch, planning for walks after dinner    Medication:  Zepbound 5 mg weekly     Contraception if premenopausal woman: Husband has vasectomy    Some nausea, bowels are OK, using some metamucil    Objective     Visit Vitals  Ht 1.549 m   Wt 61.7 kg   BMI 25.70 kg/m   OB Status Having periods   BSA 1.63 m      Peak weight: 75 kg in 04/2023      Assessment/Plan     Paige [name] is doing well without any recent medical problems.  She has lost about 13 kg or 15+% of her peak weight last August.  She has been working hard to make changes to her diet and activity.  We discussed an updated medical weight loss plan including:     Behavioral/Monitoring:  Continue with 1-2x per week weight checks in the morning.  Consider intermittent tracking with your app for accountability.      Nutrition: Continue to focus meals around high fiber foods along with protein of any sort.  Try to get any type of fruits or vegetables at most or all meals or snacks.  Limit processed meats    Activity: Build up daily aerobic activity with walking, dancing for every day that you can.  Keep up resistance twice per week    Medication:  Continue with Zepbound 5 mg weekly.    Will set up a dietitian appointment in a few months and I will see her again in  about 9 months for ongoing weight management care.    This real-time, interactive virtual Telehealth encounter was done by video with the patient's verbal consent. with the patient's verbal consent. Two patient identifiers were used and confirmed. Physical location of the patient: home Patient resides in: [place]  Physical location of the provider: office. Other participants/involvement: none  Total minutes spent: 30         Paige Eakes D. Henrene Hawking, MD  Division of Endocrinology, Diabetes and Metabolism  [facility]  [place], [place]  [name]

## 2023-11-25 NOTE — Progress Notes (Addendum)
 RXSP MEDICATION ACCESS - PHARMACY BENEFIT      RX PA approved for ZEPBOUND PER [employer] [place] [phone]    Plan type: Commercial    PA effective from 11/25/2023 TO 11/23/2024    PA #: [id number]    RX can be filled at ANY (pharmacy name)    Copay information: REFILL TOO SOON LAST FILLED 11/27/23     Copay card can be applied: Yes.      approved letter is scanned into patient's medical record.        Signed  Rhae Lerner, CPhT        RXSP MEDICATION ACCESS PRIOR AUTHORIZATION SUBMISSION - Decision pending    Date of Submission: 11/25/2023 to [facility] [place] via  [employer] [id number]  Please allow at least 24-72 hours for decision updates.     Medication (name, strength, form): ZEPBOUND    Request Type: Continuation          Signed  Deserae Jennings, CPhT

## 2024-03-01 ENCOUNTER — Encounter: Payer: BLUE CROSS/BLUE SHIELD | Attending: Adult Health | Primary: Family Medicine

## 2024-03-28 ENCOUNTER — Ambulatory Visit: Payer: BLUE CROSS/BLUE SHIELD | Attending: Internal Medicine | Primary: Family Medicine

## 2024-07-15 ENCOUNTER — Encounter: Payer: BLUE CROSS/BLUE SHIELD | Attending: Obstetrics & Gynecology | Primary: Family Medicine

## 2024-08-04 ENCOUNTER — Ambulatory Visit
Admit: 2024-08-04 | Discharge: 2024-08-04 | Payer: BLUE CROSS/BLUE SHIELD | Attending: Obstetrics & Gynecology | Primary: Family Medicine

## 2024-08-04 DIAGNOSIS — Z01419 Encounter for gynecological examination (general) (routine) without abnormal findings: Principal | ICD-10-CM

## 2024-08-04 NOTE — Progress Notes (Signed)
 [facility] [place]  [address] [place] [address]  [phone]    Old  ELNORIA [name] is a 43 y.o. female who presents for annual.  Every 26- 31 duration 2 days  chaperone offered and declined  Primary care doctor: [place]  Neg for Tobacco, Drugs, vape and occ ETOH, does SBE   LMP 07/08/2023  Current medication:zebount 2 months  and metformin  and tovestatin  and multivitamin with vitamin D  1000IU daily and calcium and vitamin D  1000IU daily   Allergy n  Medical history - hyperlipidemia and gestational diabetes  Surgical history - septorhinoplasty 2021  Family history  - hyperlipidemia  Work   Colonoscopy    recommend her to call/check with pcp's office re: this appt after [id number]  Mammogram 10/06/2023  Last pap   07/02/22 wnl  Last HPV -07/02/22 neg  Any history of abnormal pap:    Marital status: MW G4P3 nsvd x 3(one set of twin - girl-last pregnancy)  Lives:   Sexually active      Y     Birth control - vasectomy  HPV vaccine:  no  Emergency contact  myriad faminly history screening done on 07/14/2023  plan: she does not meet current guidelines for this test. She declined this test today.     NEW  PCOS   But still peirod every 26-27  Declined treat with medication for now   Patient ID: Paige [name] is a 43 y.o. female who presents for ANNUAL.    chaperone offered and declined  OB History       Gravida   8    Para   7    Term   4    Preterm   1    AB   1    Living   4         SAB   1    IAB        Ectopic        Multiple   1    Live Births   2           Obstetric Comments                     Primary care doctor: Eric Binet, MD   Neg for Tobacco, Drugs, vape and occ ETOH, does SBE     LMP 07/29/24 monthly  Current medication:zebount 2 months  and metformin  and tovestatin  and multivitamin with vitamin D  1000IU daily and calcium and vitamin D  1000IU daily   Allergy n  Medical history - hyperlipidemia and gestational diabetes  Surgical  history - septorhinoplasty 2021  Family history  - hyperlipidemia  Work   Colonoscopy    recommend her to call/check with pcp's office re: this appt after [phone]  Mammogram 10/06/2023  Last pap  07/14/23 WNL   Last HPV -07/14/23 NEG  Any history of abnormal pap:    Marital status: MW G4P3 nsvd x 3(one set of twin - girl-last pregnancy)  Lives:   Sexually active      Y     Birth control - vasectomy  HPV vaccine:  no  Emergency contact  myriad faminly history screening done on 07/14/2023  plan: she does not meet current guidelines for this test. She declined this test today.     HPI        Review of Systems   Constitutional: Negative.    HENT:  Negative.     Eyes: Negative.    Respiratory: Negative.     Cardiovascular: Negative.    Gastrointestinal: Negative.    Endocrine: Negative.    Genitourinary: Negative.    Musculoskeletal: Negative.    Skin: Negative.    Allergic/Immunologic: Negative.    Neurological: Negative.         Objective   Visit Vitals  OB Status Having periods           05/13/2023    10:51 AM 05/25/2023    12:53 PM 05/25/2023    12:54 PM 06/08/2023     8:33 AM 07/14/2023    12:00 PM 11/03/2023     8:16 AM 11/25/2023     9:33 AM   Vitals   Systolic   112  101 106    Diastolic   77  72 71    Pulse   89   71    Temp   36.3 C (97.3 F)       Height (in)    1.549 m 1.549 m 1.549 m 1.549 m   Weight (lb) 166 166.2  160.5 157 143.8 136   SpO2   99 %   90 %    BMI 31.38 kg/m2 31.42 kg/m2  30.34 kg/m2 29.66 kg/m2 27.17 kg/m2 25.7 kg/m2   BSA (m2) 1.8 m2 1.8 m2  1.77 m2 1.75 m2 1.68 m2 1.63 m2   Visit Report  Report Report  Report Report        Physical Exam  Nursing note reviewed.   Constitutional:       Appearance: Normal appearance.   HENT:      Head: Normocephalic and atraumatic.      Right Ear: External ear normal.      Left Ear: External ear normal.      Nose: Nose normal.   Cardiovascular:      Rate and Rhythm: Normal rate and regular rhythm.      Heart sounds: Normal heart sounds.   Pulmonary:      Effort:  Pulmonary effort is normal.      Breath sounds: Normal breath sounds.   Chest:   Breasts:     Right: Normal.      Left: Normal.   Abdominal:      General: Bowel sounds are normal.      Palpations: Abdomen is soft.   Genitourinary:     General: Normal vulva.      Vagina: Normal.      Cervix: Normal.      Uterus: Normal.       Adnexa: Right adnexa normal and left adnexa normal.      Rectum: External hemorrhoid present.   Musculoskeletal:      Cervical back: Normal range of motion and neck supple.   Skin:     General: Skin is warm and dry.   Psychiatric:         Mood and Affect: Mood normal.       Assessment/Plan   Encounter for gynecological examination without abnormal finding  -     Pap Smear and Aptima HPV w/ Rflx HPV Genotypes 16/18, 45 on High Risk Positive  Other orders  -     PDF Report:    The patient was counseled regarding regular monthly self-examinations of the breasts, regular sustained exercise for @ least 30 minutes 3-4 times per week, routine screening interval for mammogram, prevention of osteoporosis, importance of regular PAP smears, regular use of lap  and shoulder seat belts.      Advice her to make an annual gynecological examination in one year before leaving office today    Patient was advised to check with her insurance company to sure that all tests ordered today are covered by her insurance company before heading for the tests.

## 2024-08-07 LAB — PAP SMEAR AND APTIMA HPV W RFLX TO HPV GENOTYPES 16/18, 45 ON HIGH RISK POS: HPV Aptima: NEGATIVE

## 2024-10-06 ENCOUNTER — Inpatient Hospital Stay: Admit: 2024-10-06 | Payer: BLUE CROSS/BLUE SHIELD | Primary: Family Medicine

## 2024-10-06 DIAGNOSIS — Z01419 Encounter for gynecological examination (general) (routine) without abnormal findings: Principal | ICD-10-CM

## 2024-10-06 DIAGNOSIS — Z1231 Encounter for screening mammogram for malignant neoplasm of breast: Secondary | ICD-10-CM
